# Patient Record
Sex: Female | Born: 1950 | Race: White | Hispanic: No | Marital: Married | Smoking: Former smoker
Health system: Southern US, Community
[De-identification: ages and names within clinical notes are randomized; demographics above are authoritative.]

## PROBLEM LIST (undated history)

## (undated) DIAGNOSIS — I700 Atherosclerosis of aorta: Secondary | ICD-10-CM

## (undated) DIAGNOSIS — B019 Varicella without complication: Secondary | ICD-10-CM

## (undated) DIAGNOSIS — R319 Hematuria, unspecified: Secondary | ICD-10-CM

## (undated) DIAGNOSIS — E559 Vitamin D deficiency, unspecified: Secondary | ICD-10-CM

## (undated) DIAGNOSIS — E785 Hyperlipidemia, unspecified: Secondary | ICD-10-CM

## (undated) DIAGNOSIS — E278 Other specified disorders of adrenal gland: Secondary | ICD-10-CM

## (undated) DIAGNOSIS — C9590 Leukemia, unspecified not having achieved remission: Secondary | ICD-10-CM

## (undated) DIAGNOSIS — M8580 Other specified disorders of bone density and structure, unspecified site: Secondary | ICD-10-CM

## (undated) HISTORY — DX: Other specified disorders of bone density and structure, unspecified site: M85.80

## (undated) HISTORY — DX: Hyperlipidemia, unspecified: E78.5

## (undated) HISTORY — DX: Leukemia, unspecified not having achieved remission: C95.90

## (undated) HISTORY — DX: Hematuria, unspecified: R31.9

## (undated) HISTORY — DX: Vitamin D deficiency, unspecified: E55.9

## (undated) HISTORY — DX: Atherosclerosis of aorta: I70.0

## (undated) HISTORY — DX: Varicella without complication: B01.9

## (undated) HISTORY — DX: Other specified disorders of adrenal gland: E27.8

---

## 1998-09-28 DIAGNOSIS — C9201 Acute myeloblastic leukemia, in remission: Secondary | ICD-10-CM

## 1998-09-28 HISTORY — DX: Acute myeloblastic leukemia, in remission: C92.01

## 2001-07-24 ENCOUNTER — Other Ambulatory Visit: Admission: RE | Admit: 2001-07-24 | Discharge: 2001-07-24 | Payer: Self-pay | Admitting: Obstetrics and Gynecology

## 2002-09-03 ENCOUNTER — Other Ambulatory Visit: Admission: RE | Admit: 2002-09-03 | Discharge: 2002-09-03 | Payer: Self-pay | Admitting: Obstetrics and Gynecology

## 2012-07-23 DIAGNOSIS — C9200 Acute myeloblastic leukemia, not having achieved remission: Secondary | ICD-10-CM | POA: Insufficient documentation

## 2014-09-20 ENCOUNTER — Encounter: Payer: Self-pay | Admitting: Adult Health

## 2014-09-20 ENCOUNTER — Ambulatory Visit (INDEPENDENT_AMBULATORY_CARE_PROVIDER_SITE_OTHER): Payer: BC Managed Care – PPO | Admitting: Adult Health

## 2014-09-20 VITALS — BP 112/76 | Ht 63.0 in | Wt 121.6 lb

## 2014-09-20 DIAGNOSIS — Z7189 Other specified counseling: Secondary | ICD-10-CM | POA: Diagnosis not present

## 2014-09-20 DIAGNOSIS — Z720 Tobacco use: Secondary | ICD-10-CM | POA: Diagnosis not present

## 2014-09-20 DIAGNOSIS — Z23 Encounter for immunization: Secondary | ICD-10-CM | POA: Diagnosis not present

## 2014-09-20 DIAGNOSIS — Z7689 Persons encountering health services in other specified circumstances: Secondary | ICD-10-CM

## 2014-09-20 DIAGNOSIS — F17200 Nicotine dependence, unspecified, uncomplicated: Secondary | ICD-10-CM

## 2014-09-20 NOTE — Patient Instructions (Addendum)
Please make an appointment for your complete physical exam at your convenience. The scheduling people will contact you to set up an appointment for a bone scan. Continue to work on quitting smoking and let me know if you want to try medications to help you quit. Let me know if you need anything. Smoking Cessation Quitting smoking is important to your health and has many advantages. However, it is not always easy to quit since nicotine is a very addictive drug. Oftentimes, people try 3 times or more before being able to quit. This document explains the best ways for you to prepare to quit smoking. Quitting takes hard work and a lot of effort, but you can do it. ADVANTAGES OF QUITTING SMOKING  You will live longer, feel better, and live better.  Your body will feel the impact of quitting smoking almost immediately.  Within 20 minutes, blood pressure decreases. Your pulse returns to its normal level.  After 8 hours, carbon monoxide levels in the blood return to normal. Your oxygen level increases.  After 24 hours, the chance of having a heart attack starts to decrease. Your breath, hair, and body stop smelling like smoke.  After 48 hours, damaged nerve endings begin to recover. Your sense of taste and smell improve.  After 72 hours, the body is virtually free of nicotine. Your bronchial tubes relax and breathing becomes easier.  After 2 to 12 weeks, lungs can hold more air. Exercise becomes easier and circulation improves.  The risk of having a heart attack, stroke, cancer, or lung disease is greatly reduced.  After 1 year, the risk of coronary heart disease is cut in half.  After 5 years, the risk of stroke falls to the same as a nonsmoker.  After 10 years, the risk of lung cancer is cut in half and the risk of other cancers decreases significantly.  After 15 years, the risk of coronary heart disease drops, usually to the level of a nonsmoker.  If you are pregnant, quitting smoking will  improve your chances of having a healthy baby.  The people you live with, especially any children, will be healthier.  You will have extra money to spend on things other than cigarettes. QUESTIONS TO THINK ABOUT BEFORE ATTEMPTING TO QUIT You may want to talk about your answers with your health care provider.  Why do you want to quit?  If you tried to quit in the past, what helped and what did not?  What will be the most difficult situations for you after you quit? How will you plan to handle them?  Who can help you through the tough times? Your family? Friends? A health care provider?  What pleasures do you get from smoking? What ways can you still get pleasure if you quit? Here are some questions to ask your health care provider:  How can you help me to be successful at quitting?  What medicine do you think would be best for me and how should I take it?  What should I do if I need more help?  What is smoking withdrawal like? How can I get information on withdrawal? GET READY  Set a quit date.  Change your environment by getting rid of all cigarettes, ashtrays, matches, and lighters in your home, car, or work. Do not let people smoke in your home.  Review your past attempts to quit. Think about what worked and what did not. GET SUPPORT AND ENCOURAGEMENT You have a better chance of being successful  if you have help. You can get support in many ways.  Tell your family, friends, and coworkers that you are going to quit and need their support. Ask them not to smoke around you.  Get individual, group, or telephone counseling and support. Programs are available at [employer] and health centers. Call your local health department for information about programs in your area.  Spiritual beliefs and practices may help some smokers quit.  Download a "quit meter" on your computer to keep track of quit statistics, such as how long you have gone without smoking, cigarettes not smoked,  and money saved.  Get a self-help book about quitting smoking and staying off tobacco. Hew[place] yourself from urges to smoke. Talk to someone, go for a walk, or occupy your time with a task.  Change your normal routine. Take a different route to work. Drink tea instead of coffee. Eat breakfast in a different place.  Reduce your stress. Take a hot bath, exercise, or read a book.  Plan something enjoyable to do every day. Reward yourself for not smoking.  Explore interactive web-based programs that specialize in helping you quit. GET MEDICINE AND USE IT CORRECTLY Medicines can help you stop smoking and decrease the urge to smoke. Combining medicine with the above behavioral methods and support can greatly increase your chances of successfully quitting smoking.  Nicotine replacement therapy helps deliver nicotine to your body without the negative effects and risks of smoking. Nicotine replacement therapy includes nicotine gum, lozenges, inhalers, nasal sprays, and skin patches. Some may be available over-the-counter and others require a prescription.  Antidepressant medicine helps people abstain from smoking, but how this works is unknown. This medicine is available by prescription.  Nicotinic receptor partial agonist medicine simulates the effect of nicotine in your brain. This medicine is available by prescription. Ask your health care provider for advice about which medicines to use and how to use them based on your health history. Your health care provider will tell you what side effects to look out for if you choose to be on a medicine or therapy. Carefully read the information on the package. Do not use any other product containing nicotine while using a nicotine replacement product.  RELAPSE OR DIFFICULT SITUATIONS Most relapses occur within the first 3 months after quitting. Do not be discouraged if you start smoking again. Remember, most people try  several times before finally quitting. You may have symptoms of withdrawal because your body is used to nicotine. You may crave cigarettes, be irritable, feel very hungry, cough often, get headaches, or have difficulty concentrating. The withdrawal symptoms are only temporary. They are strongest when you first quit, but they will go away within 10-14 days. To reduce the chances of relapse, try to:  Avoid drinking alcohol. Drinking lowers your chances of successfully quitting.  Reduce the amount of caffeine you consume. Once you quit smoking, the amount of caffeine in your body increases and can give you symptoms, such as a rapid heartbeat, sweating, and anxiety.  Avoid smokers because they can make you want to smoke.  Do not let weight gain distract you. Many smokers will gain weight when they quit, usually less than 10 pounds. Eat a healthy diet and stay active. You can always lose the weight gained after you quit.  Find ways to improve your mood other than smoking. FOR MORE INFORMATION  www.smokefree.gov  Document Released: 04/09/2001 Document Revised: 08/30/2013 Document Reviewed: 07/25/2011 [employer] Patient Information  2015 [employer]. This information is not intended to replace advice given to you by your health care provider. Make sure you discuss any questions you have with your health care provider. Health Maintenance Adopting a healthy lifestyle and getting preventive care can go a long way to promote health and wellness. Talk with your health care provider about what schedule of regular examinations is right for you. This is a good chance for you to check in with your provider about disease prevention and staying healthy. In between checkups, there are plenty of things you can do on your own. Experts have done a lot of research about which lifestyle changes and preventive measures are most likely to keep you healthy. Ask your health care provider for more information. WEIGHT AND  DIET  Eat a healthy diet  Be sure to include plenty of vegetables, fruits, low-fat dairy products, and lean protein.  Do not eat a lot of foods high in solid fats, added sugars, or salt.  Get regular exercise. This is one of the most important things you can do for your health.  Most adults should exercise for at least 150 minutes each week. The exercise should increase your heart rate and make you sweat (moderate-intensity exercise).  Most adults should also do strengthening exercises at least twice a week. This is in addition to the moderate-intensity exercise.  Maintain a healthy weight  Body mass index (BMI) is a measurement that can be used to identify possible weight problems. It estimates body fat based on height and weight. Your health care provider can help determine your BMI and help you achieve or maintain a healthy weight.  For females 44 years of age and older:   A BMI below 18.5 is considered underweight.  A BMI of 18.5 to 24.9 is normal.  A BMI of 25 to 29.9 is considered overweight.  A BMI of 30 and above is considered obese.  Watch levels of cholesterol and blood lipids  You should start having your blood tested for lipids and cholesterol at 65 years of age, then have this test every 5 years.  You may need to have your cholesterol levels checked more often if:  Your lipid or cholesterol levels are high.  You are older than 65 years of age.  You are at high risk for heart disease.  CANCER SCREENING   Lung Cancer  Lung cancer screening is recommended for adults 54-19 years old who are at high risk for lung cancer because of a history of smoking.  A yearly low-dose CT scan of the lungs is recommended for people who:  Currently smoke.  Have quit within the past 15 years.  Have at least a 30-pack-year history of smoking. A pack year is smoking an average of one pack of cigarettes a day for 1 year.  Yearly screening should continue until it has been  15 years since you quit.  Yearly screening should stop if you develop a health problem that would prevent you from having lung cancer treatment.  Breast Cancer  Practice breast self-awareness. This means understanding how your breasts normally appear and feel.  It also means doing regular breast self-exams. Let your health care provider know about any changes, no matter how small.  If you are in your 20s or 30s, you should have a clinical breast exam (CBE) by a health care provider every 1-3 years as part of a regular health exam.  If you are 33 or older, have a CBE every  year. Also consider having a breast X-ray (mammogram) every year.  If you have a family history of breast cancer, talk to your health care provider about genetic screening.  If you are at high risk for breast cancer, talk to your health care provider about having an MRI and a mammogram every year.  Breast cancer gene (BRCA) assessment is recommended for women who have family members with BRCA-related cancers. BRCA-related cancers include:  Breast.  Ovarian.  Tubal.  Peritoneal cancers.  Results of the assessment will determine the need for genetic counseling and BRCA1 and BRCA2 testing. Cervical Cancer Routine pelvic examinations to screen for cervical cancer are no longer recommended for nonpregnant women who are considered low risk for cancer of the pelvic organs (ovaries, uterus, and vagina) and who do not have symptoms. A pelvic examination may be necessary if you have symptoms including those associated with pelvic infections. Ask your health care provider if a screening pelvic exam is right for you.   The Pap test is the screening test for cervical cancer for women who are considered at risk.  If you had a hysterectomy for a problem that was not cancer or a condition that could lead to cancer, then you no longer need Pap tests.  If you are older than 65 years, and you have had normal Pap tests for the past  10 years, you no longer need to have Pap tests.  If you have had past treatment for cervical cancer or a condition that could lead to cancer, you need Pap tests and screening for cancer for at least 20 years after your treatment.  If you no longer get a Pap test, assess your risk factors if they change (such as having a new sexual partner). This can affect whether you should start being screened again.  Some women have medical problems that increase their chance of getting cervical cancer. If this is the case for you, your health care provider may recommend more frequent screening and Pap tests.  The human papillomavirus (HPV) test is another test that may be used for cervical cancer screening. The HPV test looks for the virus that can cause cell changes in the cervix. The cells collected during the Pap test can be tested for HPV.  The HPV test can be used to screen women 11 years of age and older. Getting tested for HPV can extend the interval between normal Pap tests from three to five years.  An HPV test also should be used to screen women of any age who have unclear Pap test results.  After 65 years of age, women should have HPV testing as often as Pap tests.  Colorectal Cancer  This type of cancer can be detected and often prevented.  Routine colorectal cancer screening usually begins at 65 years of age and continues through 65 years of age.  Your health care provider may recommend screening at an earlier age if you have risk factors for colon cancer.  Your health care provider may also recommend using home test kits to check for hidden blood in the stool.  A small camera at the end of a tube can be used to examine your colon directly (sigmoidoscopy or colonoscopy). This is done to check for the earliest forms of colorectal cancer.  Routine screening usually begins at age 23.  Direct examination of the colon should be repeated every 5-10 years through 65 years of age. However, you  may need to be screened more often if early  forms of precancerous polyps or small growths are found. Skin Cancer  Check your skin from head to toe regularly.  Tell your health care provider about any new moles or changes in moles, especially if there is a change in a mole's shape or color.  Also tell your health care provider if you have a mole that is larger than the size of a pencil eraser.  Always use sunscreen. Apply sunscreen liberally and repeatedly throughout the day.  Protect yourself by wearing long sleeves, pants, a wide-brimmed hat, and sunglasses whenever you are outside. HEART DISEASE, DIABETES, AND HIGH BLOOD PRESSURE   Have your blood pressure checked at least every 1-2 years. High blood pressure causes heart disease and increases the risk of stroke.  If you are between 59 years and 52 years old, ask your health care provider if you should take aspirin to prevent strokes.  Have regular diabetes screenings. This involves taking a blood sample to check your fasting blood sugar level.  If you are at a normal weight and have a low risk for diabetes, have this test once every three years after 65 years of age.  If you are overweight and have a high risk for diabetes, consider being tested at a younger age or more often. PREVENTING INFECTION  Hepatitis B  If you have a higher risk for hepatitis B, you should be screened for this virus. You are considered at high risk for hepatitis B if:  You were born in a country where hepatitis B is common. Ask your health care provider which countries are considered high risk.  Your parents were born in a high-risk country, and you have not been immunized against hepatitis B (hepatitis B vaccine).  You have HIV or AIDS.  You use needles to inject street drugs.  You live with someone who has hepatitis B.  You have had sex with someone who has hepatitis B.  You get hemodialysis treatment.  You take certain medicines for conditions,  including cancer, organ transplantation, and autoimmune conditions. Hepatitis C  Blood testing is recommended for:  Everyone born from [birth year] through 1966.  Anyone with known risk factors for hepatitis C. Sexually transmitted infections (STIs)  You should be screened for sexually transmitted infections (STIs) including gonorrhea and chlamydia if:  You are sexually active and are younger than 65 years of age.  You are older than 65 years of age and your health care provider tells you that you are at risk for this type of infection.  Your sexual activity has changed since you were last screened and you are at an increased risk for chlamydia or gonorrhea. Ask your health care provider if you are at risk.  If you do not have HIV, but are at risk, it may be recommended that you take a prescription medicine daily to prevent HIV infection. This is called pre-exposure prophylaxis (PrEP). You are considered at risk if:  You are sexually active and do not regularly use condoms or know the HIV status of your partner(s).  You take drugs by injection.  You are sexually active with a partner who has HIV. Talk with your health care provider about whether you are at high risk of being infected with HIV. If you choose to begin PrEP, you should first be tested for HIV. You should then be tested every 3 months for as long as you are taking PrEP.  PREGNANCY   If you are premenopausal and you may become pregnant, ask your health  care provider about preconception counseling.  If you may become pregnant, take 400 to 800 micrograms (mcg) of folic acid every day.  If you want to prevent pregnancy, talk to your health care provider about birth control (contraception). OSTEOPOROSIS AND MENOPAUSE   Osteoporosis is a disease in which the bones lose minerals and strength with aging. This can result in serious bone fractures. Your risk for osteoporosis can be identified using a bone density scan.  If you are 47  years of age or older, or if you are at risk for osteoporosis and fractures, ask your health care provider if you should be screened.  Ask your health care provider whether you should take a calcium or vitamin D supplement to lower your risk for osteoporosis.  Menopause may have certain physical symptoms and risks.  Hormone replacement therapy may reduce some of these symptoms and risks. Talk to your health care provider about whether hormone replacement therapy is right for you.  HOME CARE INSTRUCTIONS   Schedule regular health, dental, and eye exams.  Stay current with your immunizations.   Do not use any tobacco products including cigarettes, chewing tobacco, or electronic cigarettes.  If you are pregnant, do not drink alcohol.  If you are breastfeeding, limit how much and how often you drink alcohol.  Limit alcohol intake to no more than 1 drink per day for nonpregnant women. One drink equals 12 ounces of beer, 5 ounces of wine, or 1 ounces of hard liquor.  Do not use street drugs.  Do not share needles.  Ask your health care provider for help if you need support or information about quitting drugs.  Tell your health care provider if you often feel depressed.  Tell your health care provider if you have ever been abused or do not feel safe at home. Document Released: 10/29/2010 Document Revised: 08/30/2013 Document Reviewed: 03/17/2013 [facility] Patient Information 2015 [place], [place]. This information is not intended to replace advice given to you by your health care provider. Make sure you discuss any questions you have with your health care provider.

## 2014-09-20 NOTE — Addendum Note (Signed)
Addended by: Colleen Can on: 09/20/2014 03:20 PM   Modules accepted: Orders

## 2014-09-20 NOTE — Progress Notes (Signed)
HPI:  Emma Schneider is here to establish care.  Last PCP and physical:15 years ago. Follow up with oncology yearly.   Emma Schneider is a 64 y.o. 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 She has had no evidence of recurrence thus far.    She went to GYN in October for microhematuria during exam. Was sent to Urology in [place] who did a scope of urinary tract and couldnot find out reason for hematuria. She was referred to a Urologist at [facility] and has upcoming appointment.   Immunizations: Needs Shingles Diet: eats whatever she wants. Not a lot of processed or fast food. Eats fruits and vegetables.  Exercise: Walks, but would like to exercise more.  Colonoscopy: 2013 Dexa: Unsure.  Pap Smear: Yearly- normal.  Mammogram: Yearly - normal    Has the following chronic problems that require follow up and concerns today:  Hematuria - Is following with urology. Has no urinary complaints.   Smoking Cessation - She is cutting back on her smoking.   ROS negative for unless reported above: fevers, chills,feeling poorly, unintentional weight loss, hearing or vision loss, chest pain, palpitations, leg claudication, struggling to breath,Not feeling congested in the chest, no orthopenia, no cough,no wheezing, normal appetite, no soft tissue swelling, no hemoptysis, melena, hematochezia, , falls, loc, si, or thoughts of self harm.   Past Medical History  Diagnosis Date  . Leukemia   . Chicken pox   . Blood in urine     History reviewed. No pertinent past surgical history.  Family History  Problem Relation Age of Onset  . Congestive Heart Failure Mother   . Heart attack Father   . Heart disease Father     History   Social History  . Marital Status: Married    Spouse Name: N/A  . Number of Children: N/A  . Years of Education: N/A   Social History Main Topics  . Smoking status: Current Every Day Smoker -- 0.50  packs/day    Types: Cigarettes  . Smokeless tobacco: Not on file  . Alcohol Use: No  . Drug Use: No  . Sexual Activity: Not on file   Other Topics Concern  . 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    -Married for 30 years    - Has step children, all live in [place]   - No pets   - Plays games on the computer and enjoys yard work.         Current outpatient prescriptions:  .  calcium carbonate (OS-CAL) 600 MG TABS tablet, Take 1,200 mg by mouth daily., Disp: , Rfl:   EXAM:  Filed Vitals:   09/20/14 1355  BP: 112/76    Body mass index is 21.55 kg/(m^2).  GENERAL: vitals reviewed and listed above, alert, oriented, appears well hydrated and in no acute distress  HEENT: atraumatic, conjunttiva clear, no obvious abnormalities on inspection of external nose and ears. Small amount of Cerumen in bilateral ear canal.   NECK: Neck is soft and supple without masses, no adenopathy or thyromegaly, trachea midline, no JVD. Normal range of motion.   LUNGS: clear to auscultation bilaterally, no wheezes, rales or rhonchi, good air movement  CV: Regular rate and rhythm, normal S1/S2, no audible murmurs, gallops, or rubs. No carotid bruit and no peripheral edema.   MS: moves all extremities without noticeable abnormality. No edema noted  Abd: soft/nontender/nondistended/normal  bowel sounds   Skin: warm and dry, no rash   Extremities: No clubbing, cyanosis, or edema. Capillary refill is WNL. Pulses intact bilaterally in upper and lower extremities.   Neuro: CN II-XII intact, sensation and reflexes normal throughout, 5/5 muscle strength in bilateral upper and lower extremities. Normal finger to nose. Normal rapid alternating movements.  PSYCH: pleasant and cooperative, no obvious depression or anxiety  ASSESSMENT AND PLAN:  Discussed the following assessment and plan:  1. Encounter to establish care - Follow up with me for  CPE - Follow up  with me as needed  2. Tobacco use disorder - Counseling for <3 min on smoking cessation - She is going to try and quit on her own.  - DG Bone Density; Future  .  Encounter to establish care  Tobacco use disorder - Plan: DG Bone Density -We reviewed the PMH, PSH, FH, SH, Meds and Allergies. -We provided refills for any medications we will prescribe as needed. -We addressed current concerns per orders and patient instructions. -We have asked for records for pertinent exams, studies, vaccines and notes from previous providers. -We have advised patient to follow up per instructions below.   -Patient advised to return or notify a provider immediately if symptoms worsen or persist or new concerns arise.  Patient Instructions  Please make an appointment for your complete physical exam at your convenience. The scheduling people will contact you to set up an appointment for a bone scan. Continue to work on quitting smoking and let me know if you want to try medications to help you quit. Let me know if you need anything. Smoking Cessation Quitting smoking is important to your health and has many advantages. However, it is not always easy to quit since nicotine is a very addictive drug. Oftentimes, people try 3 times or more before being able to quit. This document explains the best ways for you to prepare to quit smoking. Quitting takes hard work and a lot of effort, but you can do it. ADVANTAGES OF QUITTING SMOKING  You will live longer, feel better, and live better.  Your body will feel the impact of quitting smoking almost immediately.  Within 20 minutes, blood pressure decreases. Your pulse returns to its normal level.  After 8 hours, carbon monoxide levels in the blood return to normal. Your oxygen level increases.  After 24 hours, the chance of having a heart attack starts to decrease. Your breath, hair, and body stop smelling like smoke.  After 48 hours, damaged nerve endings begin  to recover. Your sense of taste and smell improve.  After 72 hours, the body is virtually free of nicotine. Your bronchial tubes relax and breathing becomes easier.  After 2 to 12 weeks, lungs can hold more air. Exercise becomes easier and circulation improves.  The risk of having a heart attack, stroke, cancer, or lung disease is greatly reduced.  After 1 year, the risk of coronary heart disease is cut in half.  After 5 years, the risk of stroke falls to the same as a nonsmoker.  After 10 years, the risk of lung cancer is cut in half and the risk of other cancers decreases significantly.  After 15 years, the risk of coronary heart disease drops, usually to the level of a nonsmoker.  If you are pregnant, quitting smoking will improve your chances of having a healthy baby.  The people you live with, especially any children, will be healthier.  You will have extra money to  spend on things other than cigarettes. QUESTIONS TO THINK ABOUT BEFORE ATTEMPTING TO QUIT You may want to talk about your answers with your health care provider.  Why do you want to quit?  If you tried to quit in the past, what helped and what did not?  What will be the most difficult situations for you after you quit? How will you plan to handle them?  Who can help you through the tough times? Your family? Friends? A health care provider?  What pleasures do you get from smoking? What ways can you still get pleasure if you quit? Here are some questions to ask your health care provider:  How can you help me to be successful at quitting?  What medicine do you think would be best for me and how should I take it?  What should I do if I need more help?  What is smoking withdrawal like? How can I get information on withdrawal? GET READY  Set a quit date.  Change your environment by getting rid of all cigarettes, ashtrays, matches, and lighters in your home, car, or work. Do not let people smoke in your  home.  Review your past attempts to quit. Think about what worked and what did not. GET SUPPORT AND ENCOURAGEMENT You have a better chance of being successful if you have help. You can get support in many ways.  Tell your family, friends, and coworkers that you are going to quit and need their support. Ask them not to smoke around you.  Get individual, group, or telephone counseling and support. Programs are available at [employer] and health centers. Call your local health department for information about programs in your area.  Spiritual beliefs and practices may help some smokers quit.  Download a "quit meter" on your computer to keep track of quit statistics, such as how long you have gone without smoking, cigarettes not smoked, and money saved.  Get a self-help book about quitting smoking and staying off tobacco. [place] yourself from urges to smoke. Talk to someone, go for a walk, or occupy your time with a task.  Change your normal routine. Take a different route to work. Drink tea instead of coffee. Eat breakfast in a different place.  Reduce your stress. Take a hot bath, exercise, or read a book.  Plan something enjoyable to do every day. Reward yourself for not smoking.  Explore interactive web-based programs that specialize in helping you quit. GET MEDICINE AND USE IT CORRECTLY Medicines can help you stop smoking and decrease the urge to smoke. Combining medicine with the above behavioral methods and support can greatly increase your chances of successfully quitting smoking.  Nicotine replacement therapy helps deliver nicotine to your body without the negative effects and risks of smoking. Nicotine replacement therapy includes nicotine gum, lozenges, inhalers, nasal sprays, and skin patches. Some may be available over-the-counter and others require a prescription.  Antidepressant medicine helps people abstain from smoking, but how this  works is unknown. This medicine is available by prescription.  Nicotinic receptor partial agonist medicine simulates the effect of nicotine in your brain. This medicine is available by prescription. Ask your health care provider for advice about which medicines to use and how to use them based on your health history. Your health care provider will tell you what side effects to look out for if you choose to be on a medicine or therapy. Carefully read the information on the package. Do  not use any other product containing nicotine while using a nicotine replacement product.  RELAPSE OR DIFFICULT SITUATIONS Most relapses occur within the first 3 months after quitting. Do not be discouraged if you start smoking again. Remember, most people try several times before finally quitting. You may have symptoms of withdrawal because your body is used to nicotine. You may crave cigarettes, be irritable, feel very hungry, cough often, get headaches, or have difficulty concentrating. The withdrawal symptoms are only temporary. They are strongest when you first quit, but they will go away within 10-14 days. To reduce the chances of relapse, try to:  Avoid drinking alcohol. Drinking lowers your chances of successfully quitting.  Reduce the amount of caffeine you consume. Once you quit smoking, the amount of caffeine in your body increases and can give you symptoms, such as a rapid heartbeat, sweating, and anxiety.  Avoid smokers because they can make you want to smoke.  Do not let weight gain distract you. Many smokers will gain weight when they quit, usually less than 10 pounds. Eat a healthy diet and stay active. You can always lose the weight gained after you quit.  Find ways to improve your mood other than smoking. FOR MORE INFORMATION  www.smokefree.gov  Document Released: 04/09/2001 Document Revised: 08/30/2013 Document Reviewed: 07/25/2011 [facility] Patient Information 2015 [place], [place]. This information  is not intended to replace advice given to you by your health care provider. Make sure you discuss any questions you have with your health care provider. Health Maintenance Adopting a healthy lifestyle and getting preventive care can go a long way to promote health and wellness. Talk with your health care provider about what schedule of regular examinations is right for you. This is a good chance for you to check in with your provider about disease prevention and staying healthy. In between checkups, there are plenty of things you can do on your own. Experts have done a lot of research about which lifestyle changes and preventive measures are most likely to keep you healthy. Ask your health care provider for more information. WEIGHT AND DIET  Eat a healthy diet  Be sure to include plenty of vegetables, fruits, low-fat dairy products, and lean protein.  Do not eat a lot of foods high in solid fats, added sugars, or salt.  Get regular exercise. This is one of the most important things you can do for your health.  Most adults should exercise for at least 150 minutes each week. The exercise should increase your heart rate and make you sweat (moderate-intensity exercise).  Most adults should also do strengthening exercises at least twice a week. This is in addition to the moderate-intensity exercise.  Maintain a healthy weight  Body mass index (BMI) is a measurement that can be used to identify possible weight problems. It estimates body fat based on height and weight. Your health care provider can help determine your BMI and help you achieve or maintain a healthy weight.  For females 34 years of age and older:   A BMI below 18.5 is considered underweight.  A BMI of 18.5 to 24.9 is normal.  A BMI of 25 to 29.9 is considered overweight.  A BMI of 30 and above is considered obese.  Watch levels of cholesterol and blood lipids  You should start having your blood tested for lipids and  cholesterol at 65 years of age, then have this test every 5 years.  You may need to have your cholesterol levels checked more  often if:  Your lipid or cholesterol levels are high.  You are older than 65 years of age.  You are at high risk for heart disease.  CANCER SCREENING   Lung Cancer  Lung cancer screening is recommended for adults 5-73 years old who are at high risk for lung cancer because of a history of smoking.  A yearly low-dose CT scan of the lungs is recommended for people who:  Currently smoke.  Have quit within the past 15 years.  Have at least a 30-pack-year history of smoking. A pack year is smoking an average of one pack of cigarettes a day for 1 year.  Yearly screening should continue until it has been 15 years since you quit.  Yearly screening should stop if you develop a health problem that would prevent you from having lung cancer treatment.  Breast Cancer  Practice breast self-awareness. This means understanding how your breasts normally appear and feel.  It also means doing regular breast self-exams. Let your health care provider know about any changes, no matter how small.  If you are in your 20s or 30s, you should have a clinical breast exam (CBE) by a health care provider every 1-3 years as part of a regular health exam.  If you are 79 or older, have a CBE every year. Also consider having a breast X-ray (mammogram) every year.  If you have a family history of breast cancer, talk to your health care provider about genetic screening.  If you are at high risk for breast cancer, talk to your health care provider about having an MRI and a mammogram every year.  Breast cancer gene (BRCA) assessment is recommended for women who have family members with BRCA-related cancers. BRCA-related cancers include:  Breast.  Ovarian.  Tubal.  Peritoneal cancers.  Results of the assessment will determine the need for genetic counseling and BRCA1 and BRCA2  testing. Cervical Cancer Routine pelvic examinations to screen for cervical cancer are no longer recommended for nonpregnant women who are considered low risk for cancer of the pelvic organs (ovaries, uterus, and vagina) and who do not have symptoms. A pelvic examination may be necessary if you have symptoms including those associated with pelvic infections. Ask your health care provider if a screening pelvic exam is right for you.   The Pap test is the screening test for cervical cancer for women who are considered at risk.  If you had a hysterectomy for a problem that was not cancer or a condition that could lead to cancer, then you no longer need Pap tests.  If you are older than 65 years, and you have had normal Pap tests for the past 10 years, you no longer need to have Pap tests.  If you have had past treatment for cervical cancer or a condition that could lead to cancer, you need Pap tests and screening for cancer for at least 20 years after your treatment.  If you no longer get a Pap test, assess your risk factors if they change (such as having a new sexual partner). This can affect whether you should start being screened again.  Some women have medical problems that increase their chance of getting cervical cancer. If this is the case for you, your health care provider may recommend more frequent screening and Pap tests.  The human papillomavirus (HPV) test is another test that may be used for cervical cancer screening. The HPV test looks for the virus that can cause cell changes in  the cervix. The cells collected during the Pap test can be tested for HPV.  The HPV test can be used to screen women 75 years of age and older. Getting tested for HPV can extend the interval between normal Pap tests from three to five years.  An HPV test also should be used to screen women of any age who have unclear Pap test results.  After 65 years of age, women should have HPV testing as often as Pap  tests.  Colorectal Cancer  This type of cancer can be detected and often prevented.  Routine colorectal cancer screening usually begins at 65 years of age and continues through 65 years of age.  Your health care provider may recommend screening at an earlier age if you have risk factors for colon cancer.  Your health care provider may also recommend using home test kits to check for hidden blood in the stool.  A small camera at the end of a tube can be used to examine your colon directly (sigmoidoscopy or colonoscopy). This is done to check for the earliest forms of colorectal cancer.  Routine screening usually begins at age 35.  Direct examination of the colon should be repeated every 5-10 years through 65 years of age. However, you may need to be screened more often if early forms of precancerous polyps or small growths are found. Skin Cancer  Check your skin from head to toe regularly.  Tell your health care provider about any new moles or changes in moles, especially if there is a change in a mole's shape or color.  Also tell your health care provider if you have a mole that is larger than the size of a pencil eraser.  Always use sunscreen. Apply sunscreen liberally and repeatedly throughout the day.  Protect yourself by wearing long sleeves, pants, a wide-brimmed hat, and sunglasses whenever you are outside. HEART DISEASE, DIABETES, AND HIGH BLOOD PRESSURE   Have your blood pressure checked at least every 1-2 years. High blood pressure causes heart disease and increases the risk of stroke.  If you are between 20 years and 9 years old, ask your health care provider if you should take aspirin to prevent strokes.  Have regular diabetes screenings. This involves taking a blood sample to check your fasting blood sugar level.  If you are at a normal weight and have a low risk for diabetes, have this test once every three years after 65 years of age.  If you are overweight and  have a high risk for diabetes, consider being tested at a younger age or more often. PREVENTING INFECTION  Hepatitis B  If you have a higher risk for hepatitis B, you should be screened for this virus. You are considered at high risk for hepatitis B if:  You were born in a country where hepatitis B is common. Ask your health care provider which countries are considered high risk.  Your parents were born in a high-risk country, and you have not been immunized against hepatitis B (hepatitis B vaccine).  You have HIV or AIDS.  You use needles to inject street drugs.  You live with someone who has hepatitis B.  You have had sex with someone who has hepatitis B.  You get hemodialysis treatment.  You take certain medicines for conditions, including cancer, organ transplantation, and autoimmune conditions. Hepatitis C  Blood testing is recommended for:  Everyone born from [birth year] through 1966.  Anyone with known risk factors for hepatitis C. Sexually  transmitted infections (STIs)  You should be screened for sexually transmitted infections (STIs) including gonorrhea and chlamydia if:  You are sexually active and are younger than 65 years of age.  You are older than 65 years of age and your health care provider tells you that you are at risk for this type of infection.  Your sexual activity has changed since you were last screened and you are at an increased risk for chlamydia or gonorrhea. Ask your health care provider if you are at risk.  If you do not have HIV, but are at risk, it may be recommended that you take a prescription medicine daily to prevent HIV infection. This is called pre-exposure prophylaxis (PrEP). You are considered at risk if:  You are sexually active and do not regularly use condoms or know the HIV status of your partner(s).  You take drugs by injection.  You are sexually active with a partner who has HIV. Talk with your health care provider about whether you  are at high risk of being infected with HIV. If you choose to begin PrEP, you should first be tested for HIV. You should then be tested every 3 months for as long as you are taking PrEP.  PREGNANCY   If you are premenopausal and you may become pregnant, ask your health care provider about preconception counseling.  If you may become pregnant, take 400 to 800 micrograms (mcg) of folic acid every day.  If you want to prevent pregnancy, talk to your health care provider about birth control (contraception). OSTEOPOROSIS AND MENOPAUSE   Osteoporosis is a disease in which the bones lose minerals and strength with aging. This can result in serious bone fractures. Your risk for osteoporosis can be identified using a bone density scan.  If you are 61 years of age or older, or if you are at risk for osteoporosis and fractures, ask your health care provider if you should be screened.  Ask your health care provider whether you should take a calcium or vitamin D supplement to lower your risk for osteoporosis.  Menopause may have certain physical symptoms and risks.  Hormone replacement therapy may reduce some of these symptoms and risks. Talk to your health care provider about whether hormone replacement therapy is right for you.  HOME CARE INSTRUCTIONS   Schedule regular health, dental, and eye exams.  Stay current with your immunizations.   Do not use any tobacco products including cigarettes, chewing tobacco, or electronic cigarettes.  If you are pregnant, do not drink alcohol.  If you are breastfeeding, limit how much and how often you drink alcohol.  Limit alcohol intake to no more than 1 drink per day for nonpregnant women. One drink equals 12 ounces of beer, 5 ounces of wine, or 1 ounces of hard liquor.  Do not use street drugs.  Do not share needles.  Ask your health care provider for help if you need support or information about quitting drugs.  Tell your health care provider if  you often feel depressed.  Tell your health care provider if you have ever been abused or do not feel safe at home. Document Released: 10/29/2010 Document Revised: 08/30/2013 Document Reviewed: 03/17/2013 [facility] Patient Information 2015 [place], [place]. This information is not intended to replace advice given to you by your health care provider. Make sure you discuss any questions you have with your health care provider.      [employer]

## 2014-10-17 DIAGNOSIS — R3129 Other microscopic hematuria: Secondary | ICD-10-CM

## 2014-10-17 HISTORY — DX: Other microscopic hematuria: R31.29

## 2014-12-16 DIAGNOSIS — D3501 Benign neoplasm of right adrenal gland: Secondary | ICD-10-CM

## 2014-12-16 HISTORY — DX: Benign neoplasm of right adrenal gland: D35.01

## 2015-01-06 ENCOUNTER — Ambulatory Visit (INDEPENDENT_AMBULATORY_CARE_PROVIDER_SITE_OTHER): Payer: Medicare PPO | Admitting: Adult Health

## 2015-01-06 ENCOUNTER — Encounter: Payer: Self-pay | Admitting: Adult Health

## 2015-01-06 ENCOUNTER — Other Ambulatory Visit: Payer: Self-pay | Admitting: Adult Health

## 2015-01-06 VITALS — BP 100/76 | Temp 98.5°F | Ht 63.0 in | Wt 119.9 lb

## 2015-01-06 DIAGNOSIS — Z780 Asymptomatic menopausal state: Secondary | ICD-10-CM | POA: Diagnosis not present

## 2015-01-06 DIAGNOSIS — Z720 Tobacco use: Secondary | ICD-10-CM | POA: Diagnosis not present

## 2015-01-06 DIAGNOSIS — F17200 Nicotine dependence, unspecified, uncomplicated: Secondary | ICD-10-CM

## 2015-01-06 DIAGNOSIS — Z0000 Encounter for general adult medical examination without abnormal findings: Secondary | ICD-10-CM | POA: Diagnosis not present

## 2015-01-06 DIAGNOSIS — H9193 Unspecified hearing loss, bilateral: Secondary | ICD-10-CM

## 2015-01-06 DIAGNOSIS — E278 Other specified disorders of adrenal gland: Secondary | ICD-10-CM | POA: Insufficient documentation

## 2015-01-06 DIAGNOSIS — R319 Hematuria, unspecified: Secondary | ICD-10-CM | POA: Insufficient documentation

## 2015-01-06 DIAGNOSIS — R8290 Unspecified abnormal findings in urine: Secondary | ICD-10-CM

## 2015-01-06 HISTORY — DX: Nicotine dependence, unspecified, uncomplicated: F17.200

## 2015-01-06 LAB — LIPID PANEL
CHOLESTEROL: 218 mg/dL — AB (ref 0–200)
HDL: 48.8 mg/dL (ref >39.00)
LDL Cholesterol: 145 mg/dL — ABNORMAL HIGH (ref 0–99)
NonHDL: 169.58
TRIGLYCERIDES: 123 mg/dL (ref 0.0–149.0)
Total CHOL/HDL Ratio: 4
VLDL: 24.6 mg/dL (ref 0.0–40.0)

## 2015-01-06 LAB — POCT URINALYSIS DIPSTICK
Bilirubin, UA: NEGATIVE
Glucose, UA: NEGATIVE
Ketones, UA: NEGATIVE
Nitrite, UA: NEGATIVE
PROTEIN UA: NEGATIVE
Spec Grav, UA: 1.02
UROBILINOGEN UA: 0.2
pH, UA: 6.5

## 2015-01-06 LAB — CBC WITH DIFFERENTIAL/PLATELET
Basophils Absolute: 0 10*3/uL (ref 0.0–0.1)
Basophils Relative: 0.8 % (ref 0.0–3.0)
EOS ABS: 0.1 10*3/uL (ref 0.0–0.7)
Eosinophils Relative: 1.2 % (ref 0.0–5.0)
HCT: 42.5 % (ref 36.0–46.0)
HEMOGLOBIN: 14.3 g/dL (ref 12.0–15.0)
Lymphocytes Relative: 30 % (ref 12.0–46.0)
Lymphs Abs: 1.7 10*3/uL (ref 0.7–4.0)
MCHC: 33.6 g/dL (ref 30.0–36.0)
MCV: 88.3 fl (ref 78.0–100.0)
MONO ABS: 0.5 10*3/uL (ref 0.1–1.0)
Monocytes Relative: 9.3 % (ref 3.0–12.0)
NEUTROS ABS: 3.3 10*3/uL (ref 1.4–7.7)
Neutrophils Relative %: 58.7 % (ref 43.0–77.0)
PLATELETS: 295 10*3/uL (ref 150.0–400.0)
RBC: 4.81 Mil/uL (ref 3.87–5.11)
RDW: 13.6 % (ref 11.5–15.5)
WBC: 5.6 10*3/uL (ref 4.0–10.5)

## 2015-01-06 LAB — BASIC METABOLIC PANEL
BUN: 15 mg/dL (ref 6–23)
CO2: 28 meq/L (ref 19–32)
CREATININE: 0.81 mg/dL (ref 0.40–1.20)
Calcium: 9.7 mg/dL (ref 8.4–10.5)
Chloride: 106 mEq/L (ref 96–112)
GFR: 75.42 mL/min (ref >60.00)
GLUCOSE: 96 mg/dL (ref 70–99)
POTASSIUM: 4.7 meq/L (ref 3.5–5.1)
Sodium: 142 mEq/L (ref 135–145)

## 2015-01-06 LAB — MAGNESIUM: Magnesium: 2.2 mg/dL (ref 1.5–2.5)

## 2015-01-06 LAB — HEPATIC FUNCTION PANEL
ALT: 11 U/L (ref 0–35)
AST: 15 U/L (ref 0–37)
Albumin: 4.2 g/dL (ref 3.5–5.2)
Alkaline Phosphatase: 59 U/L (ref 39–117)
BILIRUBIN TOTAL: 0.5 mg/dL (ref 0.2–1.2)
Bilirubin, Direct: 0.1 mg/dL (ref 0.0–0.3)
Total Protein: 7.3 g/dL (ref 6.0–8.3)

## 2015-01-06 LAB — HEMOGLOBIN A1C: HEMOGLOBIN A1C: 5.9 % (ref 4.6–6.5)

## 2015-01-06 LAB — VITAMIN D 25 HYDROXY (VIT D DEFICIENCY, FRACTURES): VITD: 10.17 ng/mL — ABNORMAL LOW (ref 30.00–100.00)

## 2015-01-06 LAB — TSH: TSH: 2.61 u[IU]/mL (ref 0.35–4.50)

## 2015-01-06 NOTE — Patient Instructions (Signed)
  Emma Schneider , Thank you for taking time to come for your Medicare Wellness Visit. I appreciate your ongoing commitment to your health goals. Please review the following plan we discussed and let me know if I can assist you in the future.   These are the goals we discussed: Goals    . Increase physical activity    . Quit smoking / using tobacco       This is a list of the screening recommended for you and due dates:  Health Maintenance  Topic Date Due  .  Hepatitis C: One time screening is recommended by Center for Disease Control  (CDC) for  adults born from [birth year] through 1966.   Jan 21, 1951  . HIV Screening  12/28/1964  . Pap Smear  12/29/1970  . Flu Shot  11/28/2014  . DEXA scan (bone density measurement)  12/29/2014  . Pneumonia vaccines (1 of 2 - PCV13) 12/29/2014  . Mammogram  01/28/2016  . Colon Cancer Screening  04/29/2021  . Tetanus Vaccine  09/19/2024  . Shingles Vaccine  Completed

## 2015-01-06 NOTE — Progress Notes (Signed)
Subjective:  Patient presents today for their annual wellness visit.  She is a very pleasant caucasian female who  has a past medical history of Leukemia; Chicken pox; and Blood in urine.   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   Medicare questionnaire was completed  All immunizations and health maintenance protocols were reviewed with the patient and needed orders were placed.  Appropriate screening laboratory values were ordered for the patient including screening of hyperlipidemia, renal function and hepatic function.  Medication reconciliation,  past medical history, social history, problem list and allergies were reviewed in detail with the patient  Goals were established with regard to weight loss, exercise, and  diet in compliance with medications  End of life planning was discussed- she has a living will and advanced directives in place  She has her eye exam and mammogram scheduled.    Preventive Screening-Counseling & Management  Smoking Status: Current smoker. She is working on quitting,smoking less than 10 cigarettes a day. Was smoking 1.5-2 packs at the most.   Second Hand Smoking status: Husband smokes at home.   Risk Factors Regular exercise: Walking multiple times per week, at least two times a week Diet: Heart healthy diet Fall Risk:None  Cardiac risk factors:  advanced age (older than 54 for men, 29 for women)  No Hyperlipidemia  No diabetes. Family History: Father with MI and Heart disease. Mother with CHF  Depression Screen None. PHQ2 0   Activities of Daily Living  Independent ADLs and IADLs   Hearing Difficulties: Patient endorses hearing difficulties, understanding conversation in noisy room and soft voices  Cognitive Testing No reported trouble.   Normal 3 word recall  List the Names of Other Physician/Practitioners you currently use: 1.  Dr. Sabas Sous - Hematology 2. [facility]  3. [facility] - endocrinology 4. Dr. Kandee Keen - Urology   Immunization History  Administered Date(s) Administered  . Tdap 09/20/2014  . Zoster 01/03/2015   Required Immunizations needed today: Needs flu, just had shingles vaccination. Needs to wait 4 weeks.   Screening tests- up to date Health Maintenance Due  Topic Date Due  . Hepatitis C Screening  April 23, 1951  . HIV Screening  12/28/1964  . PAP SMEAR  12/29/1970  . INFLUENZA VACCINE  11/28/2014  . DEXA SCAN  12/29/2014  . PNA vac Low Risk Adult (1 of 2 - PCV13) 12/29/2014    ROS- No pertinent positives discovered in course of AWV  The following were reviewed and entered/updated in epic: Past Medical History  Diagnosis Date  . Leukemia   . Chicken pox   . Blood in urine    Patient Active Problem List   Diagnosis Date Noted  . 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 No pertinent past surgical history.  Family History  Problem Relation Age of Onset  . Congestive Heart Failure Mother   . Heart attack Father   . Heart disease Father     Medications- reviewed and updated Current Outpatient Prescriptions  Medication Sig Dispense Refill  . calcium carbonate (OS-CAL) 600 MG TABS tablet Take 1,200 mg by mouth daily.     No current facility-administered medications for this visit.    Allergies-reviewed and updated No Known Allergies  Social History   Social History  . Marital Status: Married    Spouse Name: N/A  . Number of Children: N/A  . Years of Education: N/A  Social History Main Topics  . Smoking status: Current Every Day Smoker -- 0.50 packs/day    Types: Cigarettes  . Smokeless tobacco: None  . Alcohol Use: No  . Drug Use: No  . Sexual Activity: Not Asked   Other Topics Concern  . 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    -Married for 30 years    - Has step children, all  live in [place]   - No pets   - Plays games on the computer and enjoys yard work.        Objective: BP 100/76 mmHg  Temp(Src) 98.5 F (36.9 C) (Oral)  Ht 5\' 3"  (1.6 m)  Wt 119 lb 14.4 oz (54.386 kg)  BMI 21.24 kg/m2 GENERAL: vitals reviewed and listed above, alert, oriented, appears well hydrated and in no acute distress  HEENT: atraumatic, conjunttiva clear, no obvious abnormalities on inspection of external nose and ears. Small amount of Cerumen in bilateral ear canal.   NECK: Neck is soft and supple without masses, no adenopathy or thyromegaly, trachea midline, no JVD. Normal range of motion.   LUNGS: clear to auscultation bilaterally, no wheezes, rales or rhonchi, good air movement  CV: Regular rate and rhythm, normal S1/S2, no audible murmurs, gallops, or rubs. No carotid bruit and no peripheral edema.   MS: moves all extremities without noticeable abnormality. No edema noted  Abd: soft/nontender/nondistended/normal bowel sounds   Breast: Exam WNL. No masses, lumps, dimpling or discharge.   Skin: warm and dry, no rash   Extremities: No clubbing, cyanosis, or edema. Capillary refill is WNL. Pulses intact bilaterally in upper and lower extremities.   Neuro: CN II-XII intact, sensation and reflexes normal throughout, 5/5 muscle strength in bilateral upper and lower extremities. Normal finger to nose. Normal rapid alternating movements.  PSYCH: pleasant and cooperative, no obvious depression or anxiety  Assessment/Plan: 1. Initial Medicare annual wellness visit - Basic metabolic panel - CBC with Differential/Platelet - Hemoglobin A1c - Hepatic function panel - Lipid panel - POCT urinalysis dipstick - TSH - DG Bone Density; Future - EKG 12-Lead - Magnesium - Vitamin D, 25-hydroxy - Hep C Antibody - Follow up in one year for MWE - Follow up sooner if needed  2. Postmenopausal - DG Bone Density; Future - Magnesium - Vitamin D, 25-hydroxy  3. Tobacco use  disorder - Needs to quit smoking. Does not want to use any other aids besides nicotine gum at this time.  - Ambulatory Referral for Lung Cancer Scre  4. Hearing impaired, bilateral - Ambulatory referral to Audiology    Return precautions advised.   Emma Schneider, AGNP   These are the goals we discussed: Goals    . Increase physical activity    . Quit smoking / using tobacco       This is a list of the screening recommended for you and due dates:  Health Maintenance  Topic Date Due  .  Hepatitis C: One time screening is recommended by Center for Disease Control  (CDC) for  adults born from [birth year] through 1966.   26-May-1950  . HIV Screening  12/28/1964  . Pap Smear  12/29/1970  . Flu Shot  11/28/2014  . DEXA scan (bone density measurement)  12/29/2014  . Pneumonia vaccines (1 of 2 - PCV13) 12/29/2014  . Mammogram  01/28/2016  . Colon Cancer Screening  04/29/2021  . Tetanus Vaccine  09/19/2024  . Shingles Vaccine  Completed

## 2015-01-06 NOTE — Addendum Note (Signed)
Addended by: Elmer Picker on: 01/06/2015 05:11 PM   Modules accepted: Orders

## 2015-01-07 LAB — HEPATITIS C ANTIBODY: HCV Ab: REACTIVE — AB

## 2015-01-09 ENCOUNTER — Other Ambulatory Visit: Payer: Self-pay | Admitting: Adult Health

## 2015-01-09 LAB — URINE CULTURE

## 2015-01-09 LAB — HEPATITIS C RNA QUANTITATIVE: HCV Quantitative: NOT DETECTED IU/mL (ref <15)

## 2015-01-09 MED ORDER — CIPROFLOXACIN HCL 500 MG PO TABS: 500.0000 mg | ORAL_TABLET | Freq: Two times a day (BID) | ORAL | Status: DC

## 2015-02-10 ENCOUNTER — Ambulatory Visit (INDEPENDENT_AMBULATORY_CARE_PROVIDER_SITE_OTHER): Payer: Medicare PPO | Admitting: Adult Health

## 2015-02-10 DIAGNOSIS — Z23 Encounter for immunization: Secondary | ICD-10-CM | POA: Diagnosis not present

## 2015-02-13 ENCOUNTER — Other Ambulatory Visit: Payer: Self-pay | Admitting: Acute Care

## 2015-02-13 ENCOUNTER — Ambulatory Visit (INDEPENDENT_AMBULATORY_CARE_PROVIDER_SITE_OTHER): Payer: Medicare PPO | Admitting: Acute Care

## 2015-02-13 ENCOUNTER — Encounter: Payer: Self-pay | Admitting: Acute Care

## 2015-02-13 DIAGNOSIS — F17210 Nicotine dependence, cigarettes, uncomplicated: Secondary | ICD-10-CM

## 2015-02-13 NOTE — Progress Notes (Signed)
Shared Decision Making Visit Lung Cancer Screening Program [phone])   Eligibility:  Age 64 y.o.  Pack Years Smoking History Calculation 42 pack years (# packs/per year x # years smoked)  Recent History of coughing up blood  no  Unexplained weight loss? no ( >Than 15 pounds within the last 6 months )  Prior History Lung / other cancer :No ( Had leukemia in 2000/ no surveillance CT's) (Diagnosis within the last 5 years already requiring surveillance chest CT Scans).  Smoking Status Current Smoker  Former Smokers: Years since quit: NA  Quit Date: NA  Visit Components:  Discussion included one or more decision making aids. yes  Discussion included risk/benefits of screening. yes  Discussion included potential follow up diagnostic testing for abnormal scans. yes  Discussion included meaning and risk of over diagnosis. yes  Discussion included meaning and risk of False Positives. yes  Discussion included meaning of total radiation exposure. yes  Counseling Included:  Importance of adherence to annual lung cancer LDCT screening. yes  Impact of comorbidities on ability to participate in the program. yes  Ability and willingness to under diagnostic treatment. yes  Smoking Cessation Counseling:  Current Smokers:   Discussed importance of smoking cessation. yes  Information about tobacco cessation classes and interventions provided to patient. yes  Patient provided with "ticket" for LDCT Scan. yes  Symptomatic Patient. no  Counseling:NA  Diagnosis Code: Tobacco Use Z72.0  Asymptomatic Patient yes  Counseling (Intermediate counseling: > three minutes counseling) [id number]  Former Smokers:   Discussed the importance of maintaining cigarette abstinence. NA; Current smoker  Diagnosis Code: Personal History of Nicotine Dependence. K99.833  Information about tobacco cessation classes and interventions provided to patient. See above  Patient provided with "ticket" for  LDCT Scan. yes  Written Order for Lung Cancer Screening with LDCT placed in Epic. Yes (CT Chest Lung Cancer Screening Low Dose W/O CM) [id number] Z12.2-Screening of respiratory organs Z87.891-Personal history of nicotine dependence  I spent 15 minutes of face to face time with Emma Schneider  explaining the risks and benefits of lung cancer screening. We viewed a power point together that addressed the above noted topics, stopping at intervals to allow for questions to be asked and answered to ensure understanding. We discussed that the single most powerful action that she can take to decrease her risk of lung cancer is to quit smoking. She said she is working on this but is not ready to set a quit date. We discussed smoking cessation classes, nicotine replacement therapy, and medications to assist in the goal of becoming smoke free.We also discussed setting small goals, such as eliminating a cigarette a day for 2 weeks, and then another cigarette a day, and so on. We discussed that sometimes setting small goals makes a big project seem more attainable. She agreed, and thought she may use this strategy. I gave her the " Be stronger than your excuses " card with community resources and the [facility] number to call for free nicotine replacement therapy. Additionally I gave her my card and contact information and I told her to call me if there is anything I can do to help her meet this goal.I also gave her a copy of the power point we viewed together to refer to in the event she had any further questions,. I told her I would call her within 48 hours of the scan being completed with the results. I walked her to Emma Schneider, who will  to schedule her LDCT  scan. Ms. Orlov verbalized understanding of all of the above and  had no further questions upon leaving the office. She has my contact information in the event she has any additional questions or concerns.   Emma Spatz, NP

## 2015-02-20 ENCOUNTER — Inpatient Hospital Stay: Admission: RE | Admit: 2015-02-20 | Payer: Medicare PPO | Source: Ambulatory Visit

## 2015-02-20 ENCOUNTER — Ambulatory Visit (INDEPENDENT_AMBULATORY_CARE_PROVIDER_SITE_OTHER)
Admission: RE | Admit: 2015-02-20 | Discharge: 2015-02-20 | Disposition: A | Discharge status: Home / Self Care | Payer: Medicare PPO | Source: Ambulatory Visit | Attending: Acute Care | Admitting: Acute Care

## 2015-02-20 DIAGNOSIS — F17210 Nicotine dependence, cigarettes, uncomplicated: Secondary | ICD-10-CM | POA: Diagnosis not present

## 2015-02-21 ENCOUNTER — Telehealth: Payer: Self-pay | Admitting: Acute Care

## 2015-02-21 NOTE — Telephone Encounter (Signed)
I have called Emma Schneider and given her the results of her screening scan. I explained that the scan was read as a Lung RADs 2, nodules with a very low likelihood of becoming a clinically active cancer due to lack of size or growth. I explained that the recommendation is for a repeat scan in 12 months, which I told her we will call in Oct. 2017 to schedule. She verbalized understanding of the results and knows to call for any change in her health status. I will share the results with her PCP.

## 2015-07-18 ENCOUNTER — Other Ambulatory Visit: Payer: Self-pay | Admitting: Acute Care

## 2015-07-18 DIAGNOSIS — F17210 Nicotine dependence, cigarettes, uncomplicated: Principal | ICD-10-CM

## 2015-07-24 DIAGNOSIS — Z9221 Personal history of antineoplastic chemotherapy: Secondary | ICD-10-CM

## 2015-07-24 HISTORY — DX: Personal history of antineoplastic chemotherapy: Z92.21

## 2016-02-21 ENCOUNTER — Ambulatory Visit: Payer: Medicare PPO

## 2016-02-26 ENCOUNTER — Ambulatory Visit (INDEPENDENT_AMBULATORY_CARE_PROVIDER_SITE_OTHER)
Admission: RE | Admit: 2016-02-26 | Discharge: 2016-02-26 | Disposition: A | Discharge status: Home / Self Care | Payer: Medicare Other | Source: Ambulatory Visit | Attending: Acute Care | Admitting: Acute Care

## 2016-02-26 DIAGNOSIS — F17210 Nicotine dependence, cigarettes, uncomplicated: Principal | ICD-10-CM

## 2016-02-26 DIAGNOSIS — Z87891 Personal history of nicotine dependence: Secondary | ICD-10-CM

## 2016-02-28 ENCOUNTER — Telehealth: Payer: Self-pay | Admitting: Acute Care

## 2016-02-28 DIAGNOSIS — F17210 Nicotine dependence, cigarettes, uncomplicated: Secondary | ICD-10-CM

## 2016-02-28 NOTE — Telephone Encounter (Signed)
I have called the results of Mrs. Emma Schneider and read as low dose screening CT. Her husband took the call. I explained to him that her scan was read as a Lung RADS 2: nodules that are benign in appearance and behavior with a very low likelihood of becoming a clinically active cancer due to size or lack of growth. Recommendation per radiology is for a repeat LDCT in 12 months. I told him that we will see order and schedule Emma Schneider' s annual repeat scan in October 2018. Mr. Emma Schneider verbalized understanding of the above, and assured me that he would get these results to his wife. They have my contact information in the event they have questions or concerns in the future.

## 2016-03-15 ENCOUNTER — Encounter: Payer: Medicare PPO | Admitting: Adult Health

## 2016-03-20 ENCOUNTER — Telehealth: Payer: Self-pay | Admitting: Adult Health

## 2016-03-20 ENCOUNTER — Ambulatory Visit (INDEPENDENT_AMBULATORY_CARE_PROVIDER_SITE_OTHER): Payer: Medicare Other | Admitting: Adult Health

## 2016-03-20 ENCOUNTER — Encounter: Payer: Self-pay | Admitting: Adult Health

## 2016-03-20 VITALS — BP 124/74 | Temp 97.8°F | Ht 63.0 in | Wt 120.8 lb

## 2016-03-20 DIAGNOSIS — E559 Vitamin D deficiency, unspecified: Secondary | ICD-10-CM | POA: Diagnosis not present

## 2016-03-20 DIAGNOSIS — F17200 Nicotine dependence, unspecified, uncomplicated: Secondary | ICD-10-CM | POA: Diagnosis not present

## 2016-03-20 DIAGNOSIS — Z0000 Encounter for general adult medical examination without abnormal findings: Secondary | ICD-10-CM

## 2016-03-20 DIAGNOSIS — Z23 Encounter for immunization: Secondary | ICD-10-CM | POA: Diagnosis not present

## 2016-03-20 LAB — CBC WITH DIFFERENTIAL/PLATELET
BASOS ABS: 0 10*3/uL (ref 0.0–0.1)
BASOS PCT: 0.6 % (ref 0.0–3.0)
EOS ABS: 0 10*3/uL (ref 0.0–0.7)
Eosinophils Relative: 0.5 % (ref 0.0–5.0)
HEMATOCRIT: 40.4 % (ref 36.0–46.0)
Hemoglobin: 13.6 g/dL (ref 12.0–15.0)
LYMPHS ABS: 2 10*3/uL (ref 0.7–4.0)
LYMPHS PCT: 30.6 % (ref 12.0–46.0)
MCHC: 33.6 g/dL (ref 30.0–36.0)
MCV: 86.7 fl (ref 78.0–100.0)
MONO ABS: 0.6 10*3/uL (ref 0.1–1.0)
Monocytes Relative: 8.8 % (ref 3.0–12.0)
NEUTROS ABS: 3.9 10*3/uL (ref 1.4–7.7)
NEUTROS PCT: 59.5 % (ref 43.0–77.0)
PLATELETS: 308 10*3/uL (ref 150.0–400.0)
RBC: 4.66 Mil/uL (ref 3.87–5.11)
RDW: 13.3 % (ref 11.5–15.5)
WBC: 6.6 10*3/uL (ref 4.0–10.5)

## 2016-03-20 LAB — HEPATIC FUNCTION PANEL
ALBUMIN: 4.2 g/dL (ref 3.5–5.2)
ALK PHOS: 69 U/L (ref 39–117)
ALT: 10 U/L (ref 0–35)
AST: 14 U/L (ref 0–37)
BILIRUBIN DIRECT: 0.1 mg/dL (ref 0.0–0.3)
TOTAL PROTEIN: 6.9 g/dL (ref 6.0–8.3)
Total Bilirubin: 0.7 mg/dL (ref 0.2–1.2)

## 2016-03-20 LAB — POC URINALSYSI DIPSTICK (AUTOMATED)
BILIRUBIN UA: NEGATIVE
GLUCOSE UA: NEGATIVE
KETONES UA: NEGATIVE
NITRITE UA: NEGATIVE
Protein, UA: NEGATIVE
Spec Grav, UA: 1.02
Urobilinogen, UA: 0.2
pH, UA: 5.5

## 2016-03-20 LAB — TSH: TSH: 1.55 u[IU]/mL (ref 0.35–4.50)

## 2016-03-20 LAB — LIPID PANEL
CHOL/HDL RATIO: 4
Cholesterol: 204 mg/dL — ABNORMAL HIGH (ref 0–200)
HDL: 51 mg/dL (ref >39.00)
LDL Cholesterol: 138 mg/dL — ABNORMAL HIGH (ref 0–99)
NonHDL: 152.9
TRIGLYCERIDES: 76 mg/dL (ref 0.0–149.0)
VLDL: 15.2 mg/dL (ref 0.0–40.0)

## 2016-03-20 LAB — BASIC METABOLIC PANEL
BUN: 12 mg/dL (ref 6–23)
CALCIUM: 9.5 mg/dL (ref 8.4–10.5)
CHLORIDE: 104 meq/L (ref 96–112)
CO2: 30 meq/L (ref 19–32)
CREATININE: 0.76 mg/dL (ref 0.40–1.20)
GFR: 80.87 mL/min (ref >60.00)
GLUCOSE: 95 mg/dL (ref 70–99)
Potassium: 4.3 mEq/L (ref 3.5–5.1)
Sodium: 141 mEq/L (ref 135–145)

## 2016-03-20 LAB — VITAMIN D 25 HYDROXY (VIT D DEFICIENCY, FRACTURES): VITD: 21.75 ng/mL — AB (ref 30.00–100.00)

## 2016-03-20 MED ORDER — CHOLECALCIFEROL 75 MCG (3000 UT) PO TABS: 3000.0000 [IU] | ORAL_TABLET | Freq: Every day | ORAL | 3 refills | Status: DC

## 2016-03-20 NOTE — Patient Instructions (Addendum)
It was great seeing you!  Your exam was great! Continue to stay active and eat healthy.   I will follow up with you once I get your labs back  Quit smoking  I will see you in one year   Health Maintenance, Female Introduction Adopting a healthy lifestyle and getting preventive care can go a long way to promote health and wellness. Talk with your health care provider about what schedule of regular examinations is right for you. This is a good chance for you to check in with your provider about disease prevention and staying healthy. In between checkups, there are plenty of things you can do on your own. Experts have done a lot of research about which lifestyle changes and preventive measures are most likely to keep you healthy. Ask your health care provider for more information. Weight and diet Eat a healthy diet  Be sure to include plenty of vegetables, fruits, low-fat dairy products, and lean protein.  Do not eat a lot of foods high in solid fats, added sugars, or salt.  Get regular exercise. This is one of the most important things you can do for your health.  Most adults should exercise for at least 150 minutes each week. The exercise should increase your heart rate and make you sweat (moderate-intensity exercise).  Most adults should also do strengthening exercises at least twice a week. This is in addition to the moderate-intensity exercise. Maintain a healthy weight  Body mass index (BMI) is a measurement that can be used to identify possible weight problems. It estimates body fat based on height and weight. Your health care provider can help determine your BMI and help you achieve or maintain a healthy weight.  For females 30 years of age and older:  A BMI below 18.5 is considered underweight.  A BMI of 18.5 to 24.9 is normal.  A BMI of 25 to 29.9 is considered overweight.  A BMI of 30 and above is considered obese. Watch levels of cholesterol and blood lipids  You  should start having your blood tested for lipids and cholesterol at 66 years of age, then have this test every 5 years.  You may need to have your cholesterol levels checked more often if:  Your lipid or cholesterol levels are high.  You are older than 67 years of age.  You are at high risk for heart disease. Cancer screening Lung Cancer  Lung cancer screening is recommended for adults 46-50 years old who are at high risk for lung cancer because of a history of smoking.  A yearly low-dose CT scan of the lungs is recommended for people who:  Currently smoke.  Have quit within the past 15 years.  Have at least a 30-pack-year history of smoking. A pack year is smoking an average of one pack of cigarettes a day for 1 year.  Yearly screening should continue until it has been 15 years since you quit.  Yearly screening should stop if you develop a health problem that would prevent you from having lung cancer treatment. Breast Cancer  Practice breast self-awareness. This means understanding how your breasts normally appear and feel.  It also means doing regular breast self-exams. Let your health care provider know about any changes, no matter how small.  If you are in your 20s or 30s, you should have a clinical breast exam (CBE) by a health care provider every 1-3 years as part of a regular health exam.  If you are 40 or  older, have a CBE every year. Also consider having a breast X-ray (mammogram) every year.  If you have a family history of breast cancer, talk to your health care provider about genetic screening.  If you are at high risk for breast cancer, talk to your health care provider about having an MRI and a mammogram every year.  Breast cancer gene (BRCA) assessment is recommended for women who have family members with BRCA-related cancers. BRCA-related cancers include:  Breast.  Ovarian.  Tubal.  Peritoneal cancers.  Results of the assessment will determine the need  for genetic counseling and BRCA1 and BRCA2 testing. Cervical Cancer  Your health care provider may recommend that you be screened regularly for cancer of the pelvic organs (ovaries, uterus, and vagina). This screening involves a pelvic examination, including checking for microscopic changes to the surface of your cervix (Pap test). You may be encouraged to have this screening done every 3 years, beginning at age 36.  For women ages 72-65, health care providers may recommend pelvic exams and Pap testing every 3 years, or they may recommend the Pap and pelvic exam, combined with testing for human papilloma virus (HPV), every 5 years. Some types of HPV increase your risk of cervical cancer. Testing for HPV may also be done on women of any age with unclear Pap test results.  Other health care providers may not recommend any screening for nonpregnant women who are considered low risk for pelvic cancer and who do not have symptoms. Ask your health care provider if a screening pelvic exam is right for you.  If you have had past treatment for cervical cancer or a condition that could lead to cancer, you need Pap tests and screening for cancer for at least 20 years after your treatment. If Pap tests have been discontinued, your risk factors (such as having a new sexual partner) need to be reassessed to determine if screening should resume. Some women have medical problems that increase the chance of getting cervical cancer. In these cases, your health care provider may recommend more frequent screening and Pap tests. Colorectal Cancer  This type of cancer can be detected and often prevented.  Routine colorectal cancer screening usually begins at 66 years of age and continues through 66 years of age.  Your health care provider may recommend screening at an earlier age if you have risk factors for colon cancer.  Your health care provider may also recommend using home test kits to check for hidden blood in the  stool.  A small camera at the end of a tube can be used to examine your colon directly (sigmoidoscopy or colonoscopy). This is done to check for the earliest forms of colorectal cancer.  Routine screening usually begins at age 24.  Direct examination of the colon should be repeated every 5-10 years through 67 years of age. However, you may need to be screened more often if early forms of precancerous polyps or small growths are found. Skin Cancer  Check your skin from head to toe regularly.  Tell your health care provider about any new moles or changes in moles, especially if there is a change in a mole's shape or color.  Also tell your health care provider if you have a mole that is larger than the size of a pencil eraser.  Always use sunscreen. Apply sunscreen liberally and repeatedly throughout the day.  Protect yourself by wearing long sleeves, pants, a wide-brimmed hat, and sunglasses whenever you are outside. Heart disease,  diabetes, and high blood pressure  High blood pressure causes heart disease and increases the risk of stroke. High blood pressure is more likely to develop in:  People who have blood pressure in the high end of the normal range (130-139/85-89 mm Hg).  People who are overweight or obese.  People who are African American.  If you are 61-67 years of age, have your blood pressure checked every 3-5 years. If you are 5 years of age or older, have your blood pressure checked every year. You should have your blood pressure measured twice-once when you are at a hospital or clinic, and once when you are not at a hospital or clinic. Record the average of the two measurements. To check your blood pressure when you are not at a hospital or clinic, you can use:  An automated blood pressure machine at a pharmacy.  A home blood pressure monitor.  If you are between 24 years and 37 years old, ask your health care provider if you should take aspirin to prevent  strokes.  Have regular diabetes screenings. This involves taking a blood sample to check your fasting blood sugar level.  If you are at a normal weight and have a low risk for diabetes, have this test once every three years after 66 years of age.  If you are overweight and have a high risk for diabetes, consider being tested at a younger age or more often. Preventing infection Hepatitis B  If you have a higher risk for hepatitis B, you should be screened for this virus. You are considered at high risk for hepatitis B if:  You were born in a country where hepatitis B is common. Ask your health care provider which countries are considered high risk.  Your parents were born in a high-risk country, and you have not been immunized against hepatitis B (hepatitis B vaccine).  You have HIV or AIDS.  You use needles to inject street drugs.  You live with someone who has hepatitis B.  You have had sex with someone who has hepatitis B.  You get hemodialysis treatment.  You take certain medicines for conditions, including cancer, organ transplantation, and autoimmune conditions. Hepatitis C  Blood testing is recommended for:  Everyone born from [birth year] through 1966.  Anyone with known risk factors for hepatitis C. Sexually transmitted infections (STIs)  You should be screened for sexually transmitted infections (STIs) including gonorrhea and chlamydia if:  You are sexually active and are younger than 66 years of age.  You are older than 66 years of age and your health care provider tells you that you are at risk for this type of infection.  Your sexual activity has changed since you were last screened and you are at an increased risk for chlamydia or gonorrhea. Ask your health care provider if you are at risk.  If you do not have HIV, but are at risk, it may be recommended that you take a prescription medicine daily to prevent HIV infection. This is called pre-exposure prophylaxis  (PrEP). You are considered at risk if:  You are sexually active and do not regularly use condoms or know the HIV status of your partner(s).  You take drugs by injection.  You are sexually active with a partner who has HIV. Talk with your health care provider about whether you are at high risk of being infected with HIV. If you choose to begin PrEP, you should first be tested for HIV. You should then be tested  every 3 months for as long as you are taking PrEP. Pregnancy  If you are premenopausal and you may become pregnant, ask your health care provider about preconception counseling.  If you may become pregnant, take 400 to 800 micrograms (mcg) of folic acid every day.  If you want to prevent pregnancy, talk to your health care provider about birth control (contraception). Osteoporosis and menopause  Osteoporosis is a disease in which the bones lose minerals and strength with aging. This can result in serious bone fractures. Your risk for osteoporosis can be identified using a bone density scan.  If you are 52 years of age or older, or if you are at risk for osteoporosis and fractures, ask your health care provider if you should be screened.  Ask your health care provider whether you should take a calcium or vitamin D supplement to lower your risk for osteoporosis.  Menopause may have certain physical symptoms and risks.  Hormone replacement therapy may reduce some of these symptoms and risks. Talk to your health care provider about whether hormone replacement therapy is right for you. Follow these instructions at home:  Schedule regular health, dental, and eye exams.  Stay current with your immunizations.  Do not use any tobacco products including cigarettes, chewing tobacco, or electronic cigarettes.  If you are pregnant, do not drink alcohol.  If you are breastfeeding, limit how much and how often you drink alcohol.  Limit alcohol intake to no more than 1 drink per day for  nonpregnant women. One drink equals 12 ounces of beer, 5 ounces of wine, or 1 ounces of hard liquor.  Do not use street drugs.  Do not share needles.  Ask your health care provider for help if you need support or information about quitting drugs.  Tell your health care provider if you often feel depressed.  Tell your health care provider if you have ever been abused or do not feel safe at home. This information is not intended to replace advice given to you by your health care provider. Make sure you discuss any questions you have with your health care provider. Document Released: 10/29/2010 Document Revised: 09/21/2015 Document Reviewed: 01/17/2015  2017 Elsevier

## 2016-03-20 NOTE — Telephone Encounter (Signed)
Dated picture on her labs from her physical. Will call in prescription for vitamin D I will increase it from 1000 units to 3000 units

## 2016-03-20 NOTE — Progress Notes (Signed)
Subjective:    Patient ID: Emma Schneider, female    DOB: [birth year], 65 y.o.   MRN: [id number]  HPI Patient presents for yearly preventative medicine examination. 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  All immunizations and health maintenance protocols were reviewed with the patient and needed orders were placed.  Appropriate screening laboratory values were ordered for the patient including screening of hyperlipidemia, renal function and hepatic function.  Medication reconciliation,  past medical history, social history, problem list and allergies were reviewed in detail with the patient  Goals were established with regard to weight loss, exercise, and  diet in compliance with medications. She does not follow a specific diet but does not eat a lot of processed foods. For exercise she likes to walk  She is being seen by [facility] for hematuria and Endocrinology for accidental finding of right adrenal incidentaloma.   End of life planning was discussed. She does have a living will and advanced directive  She is up to date on dental visits and vision care. Her last colonoscopy was in 2013 - 10 year plan. She is seeing her GYN next week and will have her mammogram there.   She continues to smoke and is down to less than half a pack. She does not want help with quitting smoking.   She has no acute concerns today   Review of Systems  Constitutional: Negative.   HENT: Negative.   Eyes: Negative.   Respiratory: Negative.   Cardiovascular: Negative.   Gastrointestinal: Negative.   Endocrine: Negative.   Genitourinary: Negative.   Musculoskeletal: Negative.   Skin: Negative.   Allergic/Immunologic: Negative.   Neurological: Negative.   Hematological: Negative.   Psychiatric/Behavioral: Negative.   All other systems reviewed and are  negative.  Past Medical History:  Diagnosis Date  . 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 Blood in urine   . Chicken pox   . Leukemia ([place])   . Vitamin D deficiency     Social History   Social History  . Marital status: Married    Spouse name: N/A  . Number of children: N/A  . Years of education: N/A   Occupational History  . Not on file.   Social History Main Topics  . Smoking status: Current Every Day Smoker    Packs/day: 1.00    Years: 42.00    Types: Cigarettes  . Smokeless tobacco: Not on file     Comment: Is actively trying to cut down number of cigarettes, but not ready to set a quit date.  . Alcohol use No  . Drug use: No  . Sexual activity: Not on file   Other Topics Concern  . 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    -Married for 30 years    - Has step children, all live in [place]   - No pets   - Plays games on the computer and enjoys yard work.        No past surgical history on file.  Family History  Problem Relation Age of Onset  . Congestive Heart Failure Mother   . Heart attack Father   . Heart disease Father     No Known Allergies  Current Outpatient Prescriptions on File Prior to Visit  Medication Sig Dispense  Refill  . calcium carbonate (OS-CAL) 600 MG TABS tablet Take 1,200 mg by mouth daily.     No current facility-administered medications on file prior to visit.     BP 124/74   Temp 97.8 F (36.6 C) (Oral)   Ht 5\' 3"  (1.6 m)   Wt 120 lb 12.8 oz (54.8 kg)   BMI 21.40 kg/m       Objective:   Physical Exam  Constitutional: She is oriented to person, place, and time. She appears well-developed and well-nourished. No distress.  HENT:  Head: Normocephalic and atraumatic.  Right Ear: External ear normal.  Left Ear: External ear normal.  Nose: Nose normal.  Mouth/Throat: Oropharynx is clear and moist. No oropharyngeal exudate.  Eyes: Conjunctivae are normal. Right eye exhibits no  discharge. Left eye exhibits no discharge. No scleral icterus.  Neck: Trachea normal and normal range of motion. Neck supple. No JVD present. No tracheal tenderness present. Carotid bruit is not present. No tracheal deviation present. No thyroid mass present.  Cardiovascular: Normal rate, regular rhythm, normal heart sounds and intact distal pulses.  Exam reveals no gallop and no friction rub.   No murmur heard. Pulmonary/Chest: Effort normal and breath sounds normal. No stridor. No respiratory distress. She has no wheezes. She has no rales. She exhibits no tenderness.  Abdominal: Soft. Normal appearance, normal aorta and bowel sounds are normal. She exhibits no distension and no mass. There is no hepatosplenomegaly, splenomegaly or hepatomegaly. There is no tenderness. There is no rebound, no guarding and no CVA tenderness.  Musculoskeletal: Normal range of motion.  Lymphadenopathy:    She has no cervical adenopathy.  Neurological: She is alert and oriented to person, place, and time. She has normal reflexes. She displays normal reflexes. No cranial nerve deficit. She exhibits normal muscle tone. Coordination normal.  Skin: Skin is warm and dry. No rash noted. No erythema. No pallor.  Psychiatric: She has a normal mood and affect. Her behavior is normal. Judgment and thought content normal.  Nursing note and vitals reviewed.     Assessment & Plan:  1. Routine general medical examination at a health care facility -  Continue to eat healthy and stay active.  - Follow up in one year or sooner if needed - EKG 12-Lead- Sinus  Rhythm , Rate 68 - Basic metabolic panel - CBC with Differential/Platelet - Hepatic function panel - Lipid panel - POCT Urinalysis Dipstick (Automated) - TSH - Vitamin D, 25-hydroxy  2. Tobacco use disorder -  Educated on the importance of quitting smoking.  She does not want any additional help at this time. She will inform me if she does.   3. Need for 23-polyvalent  pneumococcal polysaccharide vaccine  - Pneumococcal polysaccharide vaccine 23-valent greater than or equal to 2yo subcutaneous/IM  4. Need for prophylactic vaccination and inoculation against influenza - Flu vaccine HIGH DOSE PF (Fluzone High dose)  5. Vitamin D deficiency - Consider increasing vitamin D 3 dose  - Vitamin D, 25-hydroxy  Dorothyann Peng, NP

## 2016-03-29 ENCOUNTER — Other Ambulatory Visit: Payer: Self-pay | Admitting: Obstetrics and Gynecology

## 2016-03-29 DIAGNOSIS — E2839 Other primary ovarian failure: Secondary | ICD-10-CM

## 2016-04-10 ENCOUNTER — Ambulatory Visit
Admission: RE | Admit: 2016-04-10 | Discharge: 2016-04-10 | Disposition: A | Discharge status: Home / Self Care | Payer: Medicare Other | Source: Ambulatory Visit | Attending: Obstetrics and Gynecology | Admitting: Obstetrics and Gynecology

## 2016-04-10 DIAGNOSIS — E2839 Other primary ovarian failure: Secondary | ICD-10-CM

## 2016-09-27 DIAGNOSIS — Z860100 Personal history of colon polyps, unspecified: Secondary | ICD-10-CM | POA: Insufficient documentation

## 2016-09-27 HISTORY — DX: Personal history of colon polyps, unspecified: Z86.0100

## 2016-10-02 ENCOUNTER — Ambulatory Visit (INDEPENDENT_AMBULATORY_CARE_PROVIDER_SITE_OTHER): Payer: Medicare Other | Admitting: Adult Health

## 2016-10-02 ENCOUNTER — Encounter: Payer: Self-pay | Admitting: Adult Health

## 2016-10-02 NOTE — Progress Notes (Signed)
Erroneous note

## 2016-10-25 LAB — HM COLONOSCOPY

## 2016-11-08 ENCOUNTER — Encounter: Payer: Self-pay | Admitting: Family Medicine

## 2017-01-17 ENCOUNTER — Encounter: Payer: Self-pay | Admitting: Adult Health

## 2017-01-21 ENCOUNTER — Ambulatory Visit (INDEPENDENT_AMBULATORY_CARE_PROVIDER_SITE_OTHER): Payer: Medicare Other

## 2017-01-21 ENCOUNTER — Encounter: Payer: Self-pay | Admitting: Adult Health

## 2017-01-21 DIAGNOSIS — Z23 Encounter for immunization: Secondary | ICD-10-CM | POA: Diagnosis not present

## 2017-02-26 ENCOUNTER — Ambulatory Visit (INDEPENDENT_AMBULATORY_CARE_PROVIDER_SITE_OTHER)
Admission: RE | Admit: 2017-02-26 | Discharge: 2017-02-26 | Disposition: A | Discharge status: Home / Self Care | Payer: Medicare Other | Source: Ambulatory Visit | Attending: Acute Care | Admitting: Acute Care

## 2017-02-26 DIAGNOSIS — Z87891 Personal history of nicotine dependence: Secondary | ICD-10-CM | POA: Diagnosis not present

## 2017-02-26 DIAGNOSIS — F17210 Nicotine dependence, cigarettes, uncomplicated: Secondary | ICD-10-CM

## 2017-03-04 ENCOUNTER — Telehealth: Payer: Self-pay | Admitting: Acute Care

## 2017-03-04 DIAGNOSIS — F17210 Nicotine dependence, cigarettes, uncomplicated: Principal | ICD-10-CM

## 2017-03-04 DIAGNOSIS — Z122 Encounter for screening for malignant neoplasm of respiratory organs: Secondary | ICD-10-CM

## 2017-03-05 NOTE — Telephone Encounter (Signed)
Agree with plan to repeat LDCT in 3 months to look for stability, interval change. Thanks./

## 2017-03-07 NOTE — Telephone Encounter (Signed)
Please order follow up LDCT in 3 months and fax results to patient's PCP. I have called the patient and explained her scan was read as a Lung RADS 4 A : suspicious findings, either short term follow up in 3 months or alternatively  PET Scan evaluation may be considered when there is a solid component of  8 mm or larger.I explained that I had Dr. Lamonte Sakai review the scan with me and we feel the best plan at this point is for a follow up Ct in 3 months ( End of Jan 2019). She verbalized understanding and had no further questions at completion of the call.  She has the office contact numbers in the event she needs to contact us prior to the scan. I explained we will order and schedule the follow up scan for 04/2017, and would be in touch regarding scheduling the imaging.

## 2017-03-11 NOTE — Telephone Encounter (Signed)
Order placed for CT chest LCS nodule f/u in 3 months.  Results faxed to [facility].

## 2017-05-29 ENCOUNTER — Ambulatory Visit (INDEPENDENT_AMBULATORY_CARE_PROVIDER_SITE_OTHER)
Admission: RE | Admit: 2017-05-29 | Discharge: 2017-05-29 | Disposition: A | Discharge status: Home / Self Care | Payer: Medicare Other | Source: Ambulatory Visit | Attending: Acute Care | Admitting: Acute Care

## 2017-05-29 DIAGNOSIS — F17210 Nicotine dependence, cigarettes, uncomplicated: Secondary | ICD-10-CM

## 2017-05-29 DIAGNOSIS — Z122 Encounter for screening for malignant neoplasm of respiratory organs: Secondary | ICD-10-CM

## 2017-05-29 DIAGNOSIS — R918 Other nonspecific abnormal finding of lung field: Secondary | ICD-10-CM | POA: Diagnosis not present

## 2017-06-06 ENCOUNTER — Other Ambulatory Visit: Payer: Self-pay | Admitting: Acute Care

## 2017-06-06 DIAGNOSIS — F17210 Nicotine dependence, cigarettes, uncomplicated: Principal | ICD-10-CM

## 2017-06-06 DIAGNOSIS — Z122 Encounter for screening for malignant neoplasm of respiratory organs: Secondary | ICD-10-CM

## 2017-06-17 ENCOUNTER — Telehealth: Payer: Self-pay | Admitting: Family Medicine

## 2017-06-17 NOTE — Telephone Encounter (Signed)
Copied from [place]. Topic: Referral - Request >> Jun 16, 2017  1:51 PM Margot Ables wrote: Reason for CRM: pt wanting referral to [facility] to establish care. Pt is not due for colonoscopy as one was done recently. Her provider at [facility] is leaving and she is wanting to establish with [facility] in the future

## 2017-06-24 NOTE — Telephone Encounter (Signed)
Noted  

## 2017-06-24 NOTE — Telephone Encounter (Signed)
Left a message for a return call.  Need reason for referral.

## 2017-06-24 NOTE — Telephone Encounter (Signed)
Pt's husband had called and spoke w/ a nurse about switching to [facility] b/c their current physician who does they're colonoscopy is retiring; misunderstanding. Pt will contact [facility] when colonoscopy is due. NO NEED FOR A REFERRAL

## 2017-10-07 ENCOUNTER — Encounter: Payer: Self-pay | Admitting: Adult Health

## 2017-10-07 ENCOUNTER — Ambulatory Visit (INDEPENDENT_AMBULATORY_CARE_PROVIDER_SITE_OTHER): Payer: Medicare Other | Admitting: Adult Health

## 2017-10-07 VITALS — BP 138/70 | Temp 98.3°F | Ht 64.0 in | Wt 117.0 lb

## 2017-10-07 DIAGNOSIS — F17200 Nicotine dependence, unspecified, uncomplicated: Secondary | ICD-10-CM

## 2017-10-07 DIAGNOSIS — E278 Other specified disorders of adrenal gland: Secondary | ICD-10-CM | POA: Diagnosis not present

## 2017-10-07 DIAGNOSIS — E559 Vitamin D deficiency, unspecified: Secondary | ICD-10-CM | POA: Diagnosis not present

## 2017-10-07 DIAGNOSIS — Z0000 Encounter for general adult medical examination without abnormal findings: Secondary | ICD-10-CM

## 2017-10-07 DIAGNOSIS — E785 Hyperlipidemia, unspecified: Secondary | ICD-10-CM | POA: Diagnosis not present

## 2017-10-07 LAB — HEPATIC FUNCTION PANEL
ALBUMIN: 4.3 g/dL (ref 3.5–5.2)
ALT: 9 U/L (ref 0–35)
AST: 14 U/L (ref 0–37)
Alkaline Phosphatase: 55 U/L (ref 39–117)
Bilirubin, Direct: 0.1 mg/dL (ref 0.0–0.3)
TOTAL PROTEIN: 6.9 g/dL (ref 6.0–8.3)
Total Bilirubin: 0.4 mg/dL (ref 0.2–1.2)

## 2017-10-07 LAB — BASIC METABOLIC PANEL
BUN: 11 mg/dL (ref 6–23)
CHLORIDE: 102 meq/L (ref 96–112)
CO2: 31 meq/L (ref 19–32)
Calcium: 9.5 mg/dL (ref 8.4–10.5)
Creatinine, Ser: 0.81 mg/dL (ref 0.40–1.20)
GFR: 74.79 mL/min (ref >60.00)
Glucose, Bld: 89 mg/dL (ref 70–99)
POTASSIUM: 4.2 meq/L (ref 3.5–5.1)
SODIUM: 140 meq/L (ref 135–145)

## 2017-10-07 LAB — VITAMIN D 25 HYDROXY (VIT D DEFICIENCY, FRACTURES): VITD: 27.35 ng/mL — AB (ref 30.00–100.00)

## 2017-10-07 LAB — CBC WITH DIFFERENTIAL/PLATELET
BASOS PCT: 0.7 % (ref 0.0–3.0)
Basophils Absolute: 0 10*3/uL (ref 0.0–0.1)
EOS PCT: 0.8 % (ref 0.0–5.0)
Eosinophils Absolute: 0 10*3/uL (ref 0.0–0.7)
HEMATOCRIT: 41.6 % (ref 36.0–46.0)
Hemoglobin: 14.3 g/dL (ref 12.0–15.0)
Lymphocytes Relative: 34 % (ref 12.0–46.0)
Lymphs Abs: 1.9 10*3/uL (ref 0.7–4.0)
MCHC: 34.3 g/dL (ref 30.0–36.0)
MCV: 87.9 fl (ref 78.0–100.0)
MONOS PCT: 7.3 % (ref 3.0–12.0)
Monocytes Absolute: 0.4 10*3/uL (ref 0.1–1.0)
NEUTROS ABS: 3.2 10*3/uL (ref 1.4–7.7)
Neutrophils Relative %: 57.2 % (ref 43.0–77.0)
PLATELETS: 316 10*3/uL (ref 150.0–400.0)
RBC: 4.74 Mil/uL (ref 3.87–5.11)
RDW: 13.7 % (ref 11.5–15.5)
WBC: 5.6 10*3/uL (ref 4.0–10.5)

## 2017-10-07 LAB — LIPID PANEL
Cholesterol: 224 mg/dL — ABNORMAL HIGH (ref 0–200)
HDL: 41.9 mg/dL (ref >39.00)
LDL Cholesterol: 150 mg/dL — ABNORMAL HIGH (ref 0–99)
NONHDL: 181.9
TRIGLYCERIDES: 160 mg/dL — AB (ref 0.0–149.0)
Total CHOL/HDL Ratio: 5
VLDL: 32 mg/dL (ref 0.0–40.0)

## 2017-10-07 LAB — TSH: TSH: 2.97 u[IU]/mL (ref 0.35–4.50)

## 2017-10-07 NOTE — Progress Notes (Signed)
Subjective:    Patient ID: Emma Schneider, female    DOB: 1950-05-18, 67 y.o.   MRN: [id number]  HPI Patient presents for yearly preventative medicine examination. 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   Osteopenia -restarted on Actonel 35 mg by GYN.  She is seen by [facility] urology for hematuria and endocrinology for incidental finding of right adrenal incidentaloma.     She is seen by hematology at [facility] for history of leukemia (AML)  Hyperlipidemia - History of. Not on any medication. Recent CT of chest showed Aortic Atherosclerosis.   Tobacco Use - she is smoking one in the morning and one at night. She has been working on cutting back.   All immunizations and health maintenance protocols were reviewed with the patient and needed orders were placed. Is UTD on vaccinations.   Appropriate screening laboratory values were ordered for the patient including screening of hyperlipidemia, renal function and hepatic function.  Medication reconciliation,  past medical history, social history, problem list and allergies were reviewed in detail with the patient  Goals were established with regard to weight loss, exercise, and  diet in compliance with medications. She has cut out red meat and continues to walk multiple times per week.   End of life planning was discussed.  She has an advanced directive and living will  She is up-to-date on her colonoscopy ( 2018), GYN and mammogram.  Participates in routine dental and vision screens.  She denies any acute complaints.   Review of Systems  Constitutional: Negative.   HENT: Negative.   Eyes: Negative.   Respiratory: Negative.   Cardiovascular: Negative.   Gastrointestinal: Negative.   Endocrine: Negative.   Genitourinary: Negative.   Musculoskeletal: Negative.   Skin: Negative.   Allergic/Immunologic:  Negative.   Neurological: Negative.   Hematological: Negative.   Psychiatric/Behavioral: Negative.    Past Medical History:  Diagnosis Date  . 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 Blood in urine   . Chicken pox   . Leukemia ([place])   . Vitamin D deficiency     Social History   Socioeconomic History  . Marital status: Married    Spouse name: Not on file  . Number of children: Not on file  . Years of education: Not on file  . Highest education level: Not on file  Occupational History  . Not on file  Social Needs  . Financial resource strain: Not on file  . Food insecurity:    Worry: Not on file    Inability: Not on file  . Transportation needs:    Medical: Not on file    Non-medical: Not on file  Tobacco Use  . Smoking status: Current Every Day Smoker    Packs/day: 1.00    Years: 42.00    Pack years: 42.00    Types: Cigarettes  . Smokeless tobacco: Never Used  . Tobacco comment: Is actively trying to cut down number of cigarettes, but not ready to set a quit date.  Substance and Sexual Activity  . Alcohol use: No    Alcohol/week: 0.0 oz  . Drug use: No  . Sexual activity: Not on file  Lifestyle  . Physical activity:    Days per week: Not on file    Minutes per session: Not on file  . Stress: Not on file  Relationships  .  Social connections:    Talks on phone: Not on file    Gets together: Not on file    Attends religious service: Not on file    Active member of club or organization: Not on file    Attends meetings of clubs or organizations: Not on file    Relationship status: Not on file  . Intimate partner violence:    Fear of current or ex partner: Not on file    Emotionally abused: Not on file    Physically abused: Not on file    Forced sexual activity: Not on file  Other Topics Concern  . 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    -Married for 30 years    - Has step children, all live in [place]   - No pets    - Plays games on the computer and enjoys yard work.     History reviewed. No pertinent surgical history.  Family History  Problem Relation Age of Onset  . Congestive Heart Failure Mother   . Heart attack Father   . Heart disease Father     No Known Allergies  Current Outpatient Medications on File Prior to Visit  Medication Sig Dispense Refill  . calcium carbonate (OS-CAL) 600 MG TABS tablet Take 1,200 mg by mouth daily.    . Cholecalciferol 3000 units TABS Take 3,000 Units by mouth daily. 90 tablet 3  . risedronate (ACTONEL) 35 MG tablet Take 35 mg by mouth every 7 (seven) days.      No current facility-administered medications on file prior to visit.     BP 138/70   Temp 98.3 F (36.8 C) (Oral)   Ht 5\' 4"  (1.626 m)   Wt 117 lb (53.1 kg)   BMI 20.08 kg/m       Objective:   Physical Exam  Constitutional: She is oriented to person, place, and time. She appears well-developed and well-nourished. No distress.  HENT:  Head: Normocephalic and atraumatic.  Right Ear: External ear normal.  Left Ear: External ear normal.  Nose: Nose normal.  Mouth/Throat: Oropharynx is clear and moist. No oropharyngeal exudate.  Eyes: Pupils are equal, round, and reactive to light. Conjunctivae and EOM are normal. Right eye exhibits no discharge. Left eye exhibits no discharge. No scleral icterus.  Neck: Normal range of motion. Neck supple. No JVD present. No tracheal deviation present. No thyromegaly present.  Cardiovascular: Normal rate, regular rhythm, normal heart sounds and intact distal pulses. Exam reveals no gallop and no friction rub.  No murmur heard. Pulmonary/Chest: Effort normal and breath sounds normal. No respiratory distress. She has no wheezes. She has no rales. She exhibits no tenderness.  Abdominal: Soft. Bowel sounds are normal. She exhibits no distension and no mass. There is no tenderness. There is no rebound and no guarding. No hernia.  Genitourinary:  Genitourinary  Comments: Done by GYN  Musculoskeletal: Normal range of motion. She exhibits no edema or deformity.  Lymphadenopathy:    She has no cervical adenopathy.  Neurological: She is alert and oriented to person, place, and time. She displays normal reflexes. No cranial nerve deficit or sensory deficit. Coordination normal.  Skin: Skin is warm and dry. Capillary refill takes less than 2 seconds. No rash noted. No erythema. No pallor.  Psychiatric: She has a normal mood and affect. Her behavior is normal. Judgment and thought content normal.  Nursing note and vitals reviewed.  Assessment & Plan:  1. Routine general medical examination at a health care facility - Continue with diet and exercise.  - Follow up in one year or sooner if needed - Basic metabolic panel - CBC with Differential/Platelet - Hepatic function panel - Lipid panel - TSH  2. Tobacco use disorder - Encouraged to quit smoking completely.   3. 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 Vitamin D deficiency  - Vitamin D, 25-hydroxy  5. Hyperlipidemia, unspecified hyperlipidemia type - Consider statin  - Basic metabolic panel - CBC with Differential/Platelet - Hepatic function panel - Lipid panel - TSH   Dorothyann Peng, NP

## 2017-10-08 ENCOUNTER — Other Ambulatory Visit: Payer: Self-pay | Admitting: Family Medicine

## 2017-10-08 MED ORDER — ATORVASTATIN CALCIUM 10 MG PO TABS: 10.0000 mg | ORAL_TABLET | Freq: Every day | ORAL | 3 refills | Status: DC

## 2018-02-09 ENCOUNTER — Ambulatory Visit (INDEPENDENT_AMBULATORY_CARE_PROVIDER_SITE_OTHER): Payer: Medicare Other

## 2018-02-09 DIAGNOSIS — Z23 Encounter for immunization: Secondary | ICD-10-CM | POA: Diagnosis not present

## 2018-02-25 ENCOUNTER — Encounter: Payer: Medicare Other | Admitting: Adult Health

## 2018-04-16 LAB — HM PAP SMEAR

## 2018-04-16 LAB — RESULTS CONSOLE HPV: CHL HPV: NEGATIVE

## 2018-04-23 ENCOUNTER — Other Ambulatory Visit: Payer: Self-pay | Admitting: Obstetrics and Gynecology

## 2018-04-23 DIAGNOSIS — M8580 Other specified disorders of bone density and structure, unspecified site: Secondary | ICD-10-CM

## 2018-04-23 DIAGNOSIS — E2839 Other primary ovarian failure: Secondary | ICD-10-CM

## 2018-06-01 ENCOUNTER — Ambulatory Visit (INDEPENDENT_AMBULATORY_CARE_PROVIDER_SITE_OTHER)
Admission: RE | Admit: 2018-06-01 | Discharge: 2018-06-01 | Disposition: A | Discharge status: Home / Self Care | Payer: Medicare Other | Source: Ambulatory Visit | Attending: Acute Care | Admitting: Acute Care

## 2018-06-01 DIAGNOSIS — F17210 Nicotine dependence, cigarettes, uncomplicated: Secondary | ICD-10-CM

## 2018-06-01 DIAGNOSIS — Z122 Encounter for screening for malignant neoplasm of respiratory organs: Secondary | ICD-10-CM

## 2018-06-04 ENCOUNTER — Other Ambulatory Visit: Payer: Self-pay | Admitting: Acute Care

## 2018-06-04 DIAGNOSIS — Z122 Encounter for screening for malignant neoplasm of respiratory organs: Secondary | ICD-10-CM

## 2018-06-04 DIAGNOSIS — Z87891 Personal history of nicotine dependence: Secondary | ICD-10-CM

## 2018-06-04 DIAGNOSIS — F17210 Nicotine dependence, cigarettes, uncomplicated: Principal | ICD-10-CM

## 2018-06-29 ENCOUNTER — Telehealth: Payer: Self-pay

## 2018-06-29 NOTE — Telephone Encounter (Signed)
Author returned phone call. Appointment scheduled for 3/11 at 10AM. No other concerns at this time.   Copied from  [id number]. Topic: Appointment Scheduling - Scheduling Inquiry for Clinic >> Jun 29, 2018  4:02 PM Alanda Slim E wrote: Reason for CRM: Pt called to schedule medicare wellness and received an auto call from [facility] / please advise

## 2018-07-08 ENCOUNTER — Ambulatory Visit: Payer: Medicare Other

## 2018-10-06 ENCOUNTER — Other Ambulatory Visit: Payer: Self-pay

## 2018-10-06 ENCOUNTER — Ambulatory Visit (INDEPENDENT_AMBULATORY_CARE_PROVIDER_SITE_OTHER): Payer: Medicare Other | Admitting: Adult Health

## 2018-10-06 ENCOUNTER — Encounter: Payer: Self-pay | Admitting: Adult Health

## 2018-10-06 DIAGNOSIS — L247 Irritant contact dermatitis due to plants, except food: Secondary | ICD-10-CM

## 2018-10-06 MED ORDER — HYDROXYZINE HCL 25 MG PO TABS: 25.0000 mg | ORAL_TABLET | Freq: Three times a day (TID) | ORAL | 0 refills | Status: AC | PRN

## 2018-10-06 MED ORDER — PREDNISONE 10 MG PO TABS: ORAL_TABLET | ORAL | 0 refills | Status: DC

## 2018-10-06 NOTE — Progress Notes (Signed)
Virtual Visit via Video Note  I connected with Junius Roads on 10/06/18 at  3:30 PM EDT by a video enabled telemedicine application and verified that I am speaking with the correct person using two identifiers.  Location patient: home Location provider:work or home office Persons participating in the virtual visit: patient, provider  I discussed the limitations of evaluation and management by telemedicine and the availability of in person appointments. The patient expressed understanding and agreed to proceed.   HPI: The 68-year-old female who is being evaluated today for concern of poison ivy exposure.  She was working in her yard last week and he came in contact with poison ivy or poison oak.  She endorses a red, severely itchy, welting rash on bilateral arms chest and back of the neck.  Has been using calamine lotion without much improvement   ROS: See pertinent positives and negatives per HPI.  Past Medical History:  Diagnosis Date  . 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 Aortic atherosclerosis ([place])   . Blood in urine   . Chicken pox   . Hyperlipidemia   . Leukemia ([place])   . Vitamin D deficiency     No past surgical history on file.  Family History  Problem Relation Age of Onset  . Congestive Heart Failure Mother   . Heart attack Father   . Heart disease Father      Current Outpatient Medications:  .  atorvastatin (LIPITOR) 10 MG tablet, Take 1 tablet (10 mg total) by mouth daily., Disp: 90 tablet, Rfl: 3 .  calcium carbonate (OS-CAL) 600 MG TABS tablet, Take 1,200 mg by mouth daily., Disp: , Rfl:  .  Cholecalciferol 3000 units TABS, Take 3,000 Units by mouth daily., Disp: 90 tablet, Rfl: 3 .  hydrOXYzine (ATARAX/VISTARIL) 25 MG tablet, Take 1 tablet (25 mg total) by mouth every 8 (eight) hours as needed for up to 5 days for itching., Disp: 15 tablet, Rfl: 0 .  predniSONE (DELTASONE) 10 MG tablet, Takes two tabs ( 20 mg) x 7 days then 1 tab ( 10 mg) x 7 days, Disp: 21  tablet, Rfl: 0 .  risedronate (ACTONEL) 35 MG tablet, Take 35 mg by mouth every 7 (seven) days. , Disp: , Rfl:   EXAM:  VITALS per patient if applicable:  GENERAL: alert, oriented, appears well and in no acute distress  HEENT: atraumatic, conjunttiva clear, no obvious abnormalities on inspection of external nose and ears  NECK: normal movements of the head and neck  LUNGS: on inspection no signs of respiratory distress, breathing rate appears normal, no obvious gross SOB, gasping or wheezing  CV: no obvious cyanosis  MS: moves all visible extremities without noticeable abnormality  PSYCH/NEURO: pleasant and cooperative, no obvious depression or anxiety, speech and thought processing grossly intact  SKIN: Red raised rash with blistering and oozing noted throughout bilateral upper extremities.  ASSESSMENT AND PLAN:  Discussed the following assessment and plan:  Irritant contact dermatitis due to plants, except food - Plan: predniSONE (DELTASONE) 10 MG tablet, hydrOXYzine (ATARAX/VISTARIL) 25 MG tablet  -Advised to continue with calamine lotion as well as can try oatmeal baths, cool compresses would also help with symptom relief.  Advised to monitor for signs and symptoms of infection and to follow-up immediately if needed.    I discussed the assessment and treatment plan with the patient. The patient was provided an opportunity to ask questions and all were answered. The patient agreed with the plan and demonstrated an understanding of  the instructions.   The patient was advised to call back or seek an in-person evaluation if the symptoms worsen or if the condition fails to improve as anticipated.   Dorothyann Peng, NP

## 2018-10-09 ENCOUNTER — Encounter: Payer: Medicare Other | Admitting: Adult Health

## 2018-10-14 ENCOUNTER — Ambulatory Visit (INDEPENDENT_AMBULATORY_CARE_PROVIDER_SITE_OTHER): Payer: Medicare Other | Admitting: Adult Health

## 2018-10-14 ENCOUNTER — Encounter: Payer: Self-pay | Admitting: Adult Health

## 2018-10-14 ENCOUNTER — Other Ambulatory Visit: Payer: Self-pay

## 2018-10-14 VITALS — BP 133/80 | Temp 97.6°F | Ht 64.5 in | Wt 112.0 lb

## 2018-10-14 DIAGNOSIS — Z0000 Encounter for general adult medical examination without abnormal findings: Secondary | ICD-10-CM

## 2018-10-14 DIAGNOSIS — C9201 Acute myeloblastic leukemia, in remission: Secondary | ICD-10-CM

## 2018-10-14 DIAGNOSIS — E559 Vitamin D deficiency, unspecified: Secondary | ICD-10-CM | POA: Diagnosis not present

## 2018-10-14 DIAGNOSIS — E785 Hyperlipidemia, unspecified: Secondary | ICD-10-CM | POA: Diagnosis not present

## 2018-10-14 DIAGNOSIS — E278 Other specified disorders of adrenal gland: Secondary | ICD-10-CM | POA: Diagnosis not present

## 2018-10-14 DIAGNOSIS — F17200 Nicotine dependence, unspecified, uncomplicated: Secondary | ICD-10-CM

## 2018-10-14 LAB — COMPREHENSIVE METABOLIC PANEL
ALT: 26 U/L (ref 0–35)
AST: 18 U/L (ref 0–37)
Albumin: 4 g/dL (ref 3.5–5.2)
Alkaline Phosphatase: 55 U/L (ref 39–117)
BUN: 17 mg/dL (ref 6–23)
CO2: 32 mEq/L (ref 19–32)
Calcium: 9.1 mg/dL (ref 8.4–10.5)
Chloride: 102 mEq/L (ref 96–112)
Creatinine, Ser: 0.81 mg/dL (ref 0.40–1.20)
GFR: 70.15 mL/min (ref >60.00)
Glucose, Bld: 72 mg/dL (ref 70–99)
Potassium: 3.7 mEq/L (ref 3.5–5.1)
Sodium: 141 mEq/L (ref 135–145)
Total Bilirubin: 0.6 mg/dL (ref 0.2–1.2)
Total Protein: 6.3 g/dL (ref 6.0–8.3)

## 2018-10-14 LAB — LIPID PANEL
Cholesterol: 156 mg/dL (ref 0–200)
HDL: 57.6 mg/dL (ref >39.00)
LDL Cholesterol: 61 mg/dL (ref 0–99)
NonHDL: 98.69
Total CHOL/HDL Ratio: 3
Triglycerides: 188 mg/dL — ABNORMAL HIGH (ref 0.0–149.0)
VLDL: 37.6 mg/dL (ref 0.0–40.0)

## 2018-10-14 LAB — VITAMIN D 25 HYDROXY (VIT D DEFICIENCY, FRACTURES): VITD: 31.29 ng/mL (ref 30.00–100.00)

## 2018-10-14 LAB — CBC WITH DIFFERENTIAL/PLATELET
Basophils Absolute: 0 10*3/uL (ref 0.0–0.1)
Basophils Relative: 0.4 % (ref 0.0–3.0)
Eosinophils Absolute: 0.1 10*3/uL (ref 0.0–0.7)
Eosinophils Relative: 0.7 % (ref 0.0–5.0)
HCT: 41.8 % (ref 36.0–46.0)
Hemoglobin: 14.3 g/dL (ref 12.0–15.0)
Lymphocytes Relative: 38.5 % (ref 12.0–46.0)
Lymphs Abs: 3.8 10*3/uL (ref 0.7–4.0)
MCHC: 34.1 g/dL (ref 30.0–36.0)
MCV: 89.1 fl (ref 78.0–100.0)
Monocytes Absolute: 0.9 10*3/uL (ref 0.1–1.0)
Monocytes Relative: 8.7 % (ref 3.0–12.0)
Neutro Abs: 5.1 10*3/uL (ref 1.4–7.7)
Neutrophils Relative %: 51.7 % (ref 43.0–77.0)
Platelets: 354 10*3/uL (ref 150.0–400.0)
RBC: 4.69 Mil/uL (ref 3.87–5.11)
RDW: 14.2 % (ref 11.5–15.5)
WBC: 9.9 10*3/uL (ref 4.0–10.5)

## 2018-10-14 LAB — TSH: TSH: 6.95 u[IU]/mL — ABNORMAL HIGH (ref 0.35–4.50)

## 2018-10-14 NOTE — Progress Notes (Signed)
Subjective:    Patient ID: Emma Schneider, female    DOB: 10/20/1950, 67 y.o.   MRN: [id number]  HPI Patient presents for yearly preventative medicine examination. 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  She is followed by [facility] hematology/oncology on a yearly basis.  Denies fevers, chills, anorexia, weight loss, nausea, vomiting, or diarrhea   Right adrenal adenoma - is followed by [facility] Endocrinology.   Vitamin D deficiency-takes 3000 units daily  BP Readings from Last 3 Encounters:  10/14/18 133/80  10/07/17 138/70  10/02/16 120/74    Tobacco Use - she is smoking less than a half a pack a day. She had quit but when COVID pandemic started she started smoking again. She is working on cutting back.  She has been working on cutting back.   She has yearly lung cancer screening due to smoking history as well as having all pulmonary nodules.  Her last low-dose CT was in February 2020 at which time it was recommended yearly follow-up as nodules were thought to be benign  All immunizations and health maintenance protocols were reviewed with the patient and needed orders were placed. UTD  Appropriate screening laboratory values were ordered for the patient including screening of hyperlipidemia, renal function and hepatic function.  Medication reconciliation,  past medical history, social history, problem list and allergies were reviewed in  detail with the patient  Goals were established with regard to weight loss, exercise, and  diet in compliance with medications  End of life planning was discussed.  She is up-to-date on routine screening colonoscopy, mammogram, dental and vision screen   Review of Systems  Constitutional: Negative.   HENT: Negative.   Eyes: Negative.   Respiratory: Negative.   Cardiovascular: Negative.   Gastrointestinal: Negative.   Endocrine: Negative.   Genitourinary: Negative.   Musculoskeletal: Negative.   Skin: Negative.   Allergic/Immunologic: Negative.   Neurological: Negative.   Hematological: Negative.   Psychiatric/Behavioral: Negative.    Past Medical History:  Diagnosis Date  . 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 Aortic atherosclerosis ([place])   . Blood in urine   . Chicken pox   . Hyperlipidemia   . Leukemia ([place])   . Vitamin D deficiency     Social History   Socioeconomic History  . Marital status: Married    Spouse name: Not on file  . Number of children: Not on file  . Years of education: Not on file  . Highest education level: Not on file  Occupational History  . Not on file  Social Needs  . Financial resource strain: Not on file  . Food insecurity    Worry: Not on file    Inability: Not on file  . Transportation needs    Medical: Not on file    Non-medical: Not on file  Tobacco Use  . Smoking status: Current Every Day Smoker    Packs/day: 1.00    Years: 42.00    Pack years: 42.00  Types: Cigarettes  . Smokeless tobacco: Never Used  . Tobacco comment: Is actively trying to cut down number of cigarettes, but not ready to set a quit date.  Substance and Sexual Activity  . Alcohol use: No    Alcohol/week: 0.0 standard drinks  . Drug use: No  . Sexual activity: Not on file  Lifestyle  . Physical activity    Days per week: Not on file    Minutes per session: Not on file  . Stress: Not on file  Relationships  . Social Herbalist on phone: Not  on file    Gets together: Not on file    Attends religious service: Not on file    Active member of club or organization: Not on file    Attends meetings of clubs or organizations: Not on file    Relationship status: Not on file  . Intimate partner violence    Fear of current or ex partner: Not on file    Emotionally abused: Not on file    Physically abused: Not on file    Forced sexual activity: Not on file  Other Topics Concern  . 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    -Married for 30 years    - Has step children, all live in [place]   - No pets   - Plays games on the computer and enjoys yard work.     History reviewed. No pertinent surgical history.  Family History  Problem Relation Age of Onset  . Congestive Heart Failure Mother   . Heart attack Father   . Heart disease Father     No Known Allergies  Current Outpatient Medications on File Prior to Visit  Medication Sig Dispense Refill  . atorvastatin (LIPITOR) 10 MG tablet Take 1 tablet (10 mg total) by mouth daily. 90 tablet 3  . calcium carbonate (OS-CAL) 600 MG TABS tablet Take 1,200 mg by mouth daily.    . Cholecalciferol 3000 units TABS Take 3,000 Units by mouth daily. 90 tablet 3  . ferrous sulfate 324 MG TBEC Take 324 mg by mouth.    . predniSONE (DELTASONE) 10 MG tablet Takes two tabs ( 20 mg) x 7 days then 1 tab ( 10 mg) x 7 days 21 tablet 0  . risedronate (ACTONEL) 35 MG tablet Take 35 mg by mouth every 7 (seven) days.      No current facility-administered medications on file prior to visit.     BP 133/80 Comment: BOTH ARMS  Temp 97.6 F (36.4 C)   Ht 5' 4.5" (1.638 m) Comment: WITH SHOES  Wt 112 lb (50.8 kg)   BMI 18.93 kg/m       Objective:   Physical Exam Vitals signs and nursing note reviewed.  Constitutional:      General: She is not in acute distress.    Appearance: Normal appearance. She is well-developed and normal weight.  HENT:      Head: Normocephalic and atraumatic.     Right Ear: Tympanic membrane, ear canal and external ear normal. There is no impacted cerumen.     Left Ear: Tympanic membrane and external ear normal. There is no impacted cerumen.     Nose: Nose normal. No congestion or rhinorrhea.     Mouth/Throat:     Mouth: Mucous membranes are moist.     Pharynx: Oropharynx is clear. No oropharyngeal exudate or posterior oropharyngeal  erythema.  Eyes:     General:        Right eye: No discharge.        Left eye: No discharge.     Conjunctiva/sclera: Conjunctivae normal.  Neck:     Musculoskeletal: Normal range of motion and neck supple.     Thyroid: No thyromegaly.     Trachea: No tracheal deviation.  Cardiovascular:     Rate and Rhythm: Normal rate and regular rhythm.     Heart sounds: Normal heart sounds. No murmur. No friction rub. No gallop.   Pulmonary:     Effort: Pulmonary effort is normal. No respiratory distress.     Breath sounds: Normal breath sounds. No wheezing or rales.  Chest:     Chest wall: No tenderness.  Abdominal:     General: Bowel sounds are normal. There is no distension.     Palpations: Abdomen is soft. There is no mass.     Tenderness: There is no abdominal tenderness. There is no right CVA tenderness, left CVA tenderness, guarding or rebound.     Hernia: No hernia is present.  Musculoskeletal: Normal range of motion.        General: No swelling, tenderness, deformity or signs of injury.     Right lower leg: No edema.     Left lower leg: No edema.  Lymphadenopathy:     Cervical: No cervical adenopathy.  Skin:    General: Skin is warm and dry.     Coloration: Skin is not jaundiced or pale.     Findings: No bruising, erythema, lesion or rash.  Neurological:     Mental Status: She is alert and oriented to person, place, and time.     Cranial Nerves: No cranial nerve deficit.     Coordination: Coordination normal.  Psychiatric:        Mood and Affect: Mood normal.         Behavior: Behavior normal.        Thought Content: Thought content normal.        Judgment: Judgment normal.       Assessment & Plan:  1. Routine general medical examination at a health care facility - Needs to quit smoking  - Heart healthy diet and exercise  - Follow up in one year or sooner if needed - CBC with Differential/Platelet - Comprehensive metabolic panel - Lipid panel - TSH  2. Tobacco use disorder - Encouraged to quit smoking  3. 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 Vitamin D deficiency  - Vitamin D, 25-hydroxy  5. Hyperlipidemia, unspecified hyperlipidemia type - Consider increasing statin  - CBC with Differential/Platelet - Comprehensive metabolic panel - Lipid panel - TSH  6. Acute myeloid leukemia in remission [facility]) - Follow up with [place] [facility] as directed   Dorothyann Peng, NP

## 2018-10-16 ENCOUNTER — Other Ambulatory Visit: Payer: Self-pay | Admitting: Family Medicine

## 2018-10-16 DIAGNOSIS — R7989 Other specified abnormal findings of blood chemistry: Secondary | ICD-10-CM

## 2018-11-02 ENCOUNTER — Ambulatory Visit
Admission: RE | Admit: 2018-11-02 | Discharge: 2018-11-02 | Disposition: A | Discharge status: Home / Self Care | Payer: Medicare Other | Source: Ambulatory Visit | Attending: Adult Health | Admitting: Adult Health

## 2018-11-02 DIAGNOSIS — R7989 Other specified abnormal findings of blood chemistry: Secondary | ICD-10-CM

## 2018-11-04 ENCOUNTER — Other Ambulatory Visit: Payer: Self-pay | Admitting: Adult Health

## 2018-11-04 DIAGNOSIS — R7989 Other specified abnormal findings of blood chemistry: Secondary | ICD-10-CM

## 2018-11-05 ENCOUNTER — Other Ambulatory Visit: Payer: Self-pay | Admitting: Adult Health

## 2018-11-05 NOTE — Telephone Encounter (Signed)
Sent to the pharmacy by e-scribe. 

## 2018-12-07 ENCOUNTER — Other Ambulatory Visit (INDEPENDENT_AMBULATORY_CARE_PROVIDER_SITE_OTHER): Payer: Medicare Other

## 2018-12-07 ENCOUNTER — Other Ambulatory Visit: Payer: Self-pay

## 2018-12-07 DIAGNOSIS — R7989 Other specified abnormal findings of blood chemistry: Secondary | ICD-10-CM | POA: Diagnosis not present

## 2018-12-07 LAB — TSH: TSH: 4.55 u[IU]/mL — ABNORMAL HIGH (ref 0.35–4.50)

## 2018-12-07 LAB — T3, FREE: T3, Free: 3.1 pg/mL (ref 2.3–4.2)

## 2018-12-07 LAB — T4, FREE: Free T4: 0.89 ng/dL (ref 0.60–1.60)

## 2018-12-08 ENCOUNTER — Other Ambulatory Visit: Payer: Self-pay | Admitting: Adult Health

## 2018-12-15 ENCOUNTER — Other Ambulatory Visit: Payer: Self-pay | Admitting: Adult Health

## 2018-12-15 DIAGNOSIS — E039 Hypothyroidism, unspecified: Secondary | ICD-10-CM

## 2018-12-15 MED ORDER — LEVOTHYROXINE SODIUM 25 MCG PO TABS: 25.0000 ug | ORAL_TABLET | Freq: Every day | ORAL | 1 refills | Status: DC

## 2018-12-16 ENCOUNTER — Encounter: Payer: Self-pay | Admitting: Family Medicine

## 2018-12-16 NOTE — Telephone Encounter (Signed)
Spoke to the pt and informed her of test results.  She states she is taking OTC every other day.  Is willing to try everyday is ok with Tommi Rumps.  Explained that she may experience stomach discomfort and constipation.  Also has a new Medicare plan and her new prescription plan will cover Robaxin.  Would like to switch back.  Please advise.

## 2018-12-17 NOTE — Telephone Encounter (Signed)
I don't have her ever being on Robaxin

## 2018-12-17 NOTE — Telephone Encounter (Signed)
Documentation below was not meant for this chart.

## 2019-01-14 ENCOUNTER — Telehealth (INDEPENDENT_AMBULATORY_CARE_PROVIDER_SITE_OTHER): Payer: Medicare Other | Admitting: Adult Health

## 2019-01-14 ENCOUNTER — Other Ambulatory Visit: Payer: Self-pay

## 2019-01-14 DIAGNOSIS — E039 Hypothyroidism, unspecified: Secondary | ICD-10-CM | POA: Diagnosis not present

## 2019-01-14 NOTE — Progress Notes (Signed)
Virtual Visit via Video Note  I connected with Emma Schneider on 01/14/19 at 10:30 AM EDT by a video enabled telemedicine application and verified that I am speaking with the correct person using two identifiers.  Location patient: home Location provider:work or home office Persons participating in the virtual visit: patient, provider  I discussed the limitations of evaluation and management by telemedicine and the availability of in person appointments. The patient expressed understanding and agreed to proceed.   HPI:  68-year-old female who is being evaluated today for follow-up regarding hypothyroidism.  During her physical exam her TSH was slightly elevated at 4.55 and previous to that her level was 6.95.  She was a symptomatic with thinning hair and fatigue.  We discussed starting Synthroid on a trial.  To see if this improved.  She has been taking Synthroid 25 mcg for the last month.  She reports that her hair is no longer falling out in clumps like it used to and she seems to have more energy.  She is happy with the results.   ROS: See pertinent positives and negatives per HPI.  Past Medical History:  Diagnosis Date  . 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 Aortic atherosclerosis ([place])   . Blood in urine   . Chicken pox   . Hyperlipidemia   . Leukemia ([place])   . Vitamin D deficiency     No past surgical history on file.  Family History  Problem Relation Age of Onset  . Congestive Heart Failure Mother   . Heart attack Father   . Heart disease Father      Current Outpatient Medications:  .  atorvastatin (LIPITOR) 10 MG tablet, Take 1 tablet by mouth once daily, Disp: 90 tablet, Rfl: 3 .  calcium carbonate (OS-CAL) 600 MG TABS tablet, Take 1,200 mg by mouth daily., Disp: , Rfl:  .  Cholecalciferol 3000 units TABS, Take 3,000 Units by mouth daily., Disp: 90 tablet, Rfl: 3 .  ferrous sulfate 324 MG TBEC, Take 324 mg by mouth., Disp: , Rfl:  .  levothyroxine (SYNTHROID) 25 MCG  tablet, Take 1 tablet (25 mcg total) by mouth daily before breakfast., Disp: 30 tablet, Rfl: 1 .  risedronate (ACTONEL) 35 MG tablet, Take 35 mg by mouth every 7 (seven) days. , Disp: , Rfl:   EXAM:  VITALS per patient if applicable:  GENERAL: alert, oriented, appears well and in no acute distress  HEENT: atraumatic, conjunttiva clear, no obvious abnormalities on inspection of external nose and ears  NECK: normal movements of the head and neck  LUNGS: on inspection no signs of respiratory distress, breathing rate appears normal, no obvious gross SOB, gasping or wheezing  CV: no obvious cyanosis  MS: moves all visible extremities without noticeable abnormality  PSYCH/NEURO: pleasant and cooperative, no obvious depression or anxiety, speech and thought processing grossly intact  ASSESSMENT AND PLAN:  Discussed the following assessment and plan:  1. Hypothyroidism, unspecified type -We will have her follow-up for repeat TSH.    I discussed the assessment and treatment plan with the patient. The patient was provided an opportunity to ask questions and all were answered. The patient agreed with the plan and demonstrated an understanding of the instructions.   The patient was advised to call back or seek an in-person evaluation if the symptoms worsen or if the condition fails to improve as anticipated.   Dorothyann Peng, NP

## 2019-01-19 ENCOUNTER — Other Ambulatory Visit: Payer: Self-pay

## 2019-01-19 ENCOUNTER — Other Ambulatory Visit (INDEPENDENT_AMBULATORY_CARE_PROVIDER_SITE_OTHER): Payer: Medicare Other

## 2019-01-19 ENCOUNTER — Ambulatory Visit: Payer: Medicare Other

## 2019-01-19 DIAGNOSIS — Z23 Encounter for immunization: Secondary | ICD-10-CM

## 2019-01-19 DIAGNOSIS — E039 Hypothyroidism, unspecified: Secondary | ICD-10-CM | POA: Diagnosis not present

## 2019-01-19 LAB — TSH: TSH: 1.74 u[IU]/mL (ref 0.35–4.50)

## 2019-01-20 ENCOUNTER — Other Ambulatory Visit: Payer: Self-pay | Admitting: Family Medicine

## 2019-01-20 DIAGNOSIS — E039 Hypothyroidism, unspecified: Secondary | ICD-10-CM

## 2019-01-20 MED ORDER — LEVOTHYROXINE SODIUM 25 MCG PO TABS: 25.0000 ug | ORAL_TABLET | Freq: Every day | ORAL | 3 refills | Status: DC

## 2019-05-11 DIAGNOSIS — E079 Disorder of thyroid, unspecified: Secondary | ICD-10-CM

## 2019-05-11 HISTORY — DX: Disorder of thyroid, unspecified: E07.9

## 2019-06-03 ENCOUNTER — Ambulatory Visit (INDEPENDENT_AMBULATORY_CARE_PROVIDER_SITE_OTHER)
Admission: RE | Admit: 2019-06-03 | Discharge: 2019-06-03 | Disposition: A | Discharge status: Home / Self Care | Payer: Medicare PPO | Source: Ambulatory Visit | Attending: Acute Care | Admitting: Acute Care

## 2019-06-03 ENCOUNTER — Other Ambulatory Visit: Payer: Self-pay

## 2019-06-03 DIAGNOSIS — F17210 Nicotine dependence, cigarettes, uncomplicated: Secondary | ICD-10-CM

## 2019-06-03 DIAGNOSIS — Z87891 Personal history of nicotine dependence: Secondary | ICD-10-CM | POA: Diagnosis not present

## 2019-06-03 DIAGNOSIS — Z122 Encounter for screening for malignant neoplasm of respiratory organs: Secondary | ICD-10-CM

## 2019-06-04 NOTE — Progress Notes (Signed)
Please call patient and let them  know their  low dose Ct was read as a Lung RADS 2: nodules that are benign in appearance and behavior with a very low likelihood of becoming a clinically active cancer due to size or lack of growth. Recommendation per radiology is for a repeat LDCT in 12 months. Please let them  know we will order and schedule their  annual screening scan for 05/2020. Please let them  know there was notation of CAD on their  scan.  Please remind the patient  that this is a non-gated exam therefore degree or severity of disease  cannot be determined. Please have them  follow up with their PCP regarding potential risk factor modification, dietary therapy or pharmacologic therapy if clinically indicated. Pt.  is  currently on statin therapy. Please place order for annual  screening scan for  05/2020 and fax results to PCP. Thanks so much. 

## 2019-06-07 ENCOUNTER — Other Ambulatory Visit: Payer: Self-pay | Admitting: *Deleted

## 2019-06-07 DIAGNOSIS — F17210 Nicotine dependence, cigarettes, uncomplicated: Secondary | ICD-10-CM

## 2019-06-07 DIAGNOSIS — Z87891 Personal history of nicotine dependence: Secondary | ICD-10-CM

## 2019-11-29 ENCOUNTER — Other Ambulatory Visit: Payer: Self-pay | Admitting: Adult Health

## 2019-11-29 NOTE — Telephone Encounter (Signed)
atorvastatin (LIPITOR) 10 MG tablet  [place], [place] [address] Phone:  [phone]  Fax:  [phone]

## 2019-11-30 NOTE — Telephone Encounter (Signed)
This patient is past due for her cpx.  Please schedule.  Will then fill. Please send message back when scheduled.

## 2019-12-03 MED ORDER — ATORVASTATIN CALCIUM 10 MG PO TABS: 10.0000 mg | ORAL_TABLET | Freq: Every day | ORAL | 0 refills | Status: DC

## 2019-12-03 NOTE — Telephone Encounter (Signed)
Pt is scheduled for CPE October 14th. Pt states she is out of med

## 2019-12-03 NOTE — Addendum Note (Signed)
Addended by: Miles Costain T on: 12/03/2019 10:16 AM   Modules accepted: Orders

## 2019-12-03 NOTE — Telephone Encounter (Signed)
SENT TO THE PHARMACY BY E-SCRIBE. 

## 2020-01-13 ENCOUNTER — Other Ambulatory Visit: Payer: Self-pay

## 2020-01-13 ENCOUNTER — Ambulatory Visit (INDEPENDENT_AMBULATORY_CARE_PROVIDER_SITE_OTHER): Payer: Medicare PPO

## 2020-01-13 DIAGNOSIS — Z23 Encounter for immunization: Secondary | ICD-10-CM | POA: Diagnosis not present

## 2020-02-09 NOTE — Progress Notes (Signed)
Subjective:    Patient ID: Emma Schneider, female    DOB: 1950/08/13, 69 y.o.   MRN: [id number]  HPI Patient presents for yearly preventative medicine examination. 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  Hyperlipidemia edema/aortic atherosclerosis-is currently prescribed Lipitor 10 mg daily.  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  She was  followed by [facility] hematology/oncology on a yearly basis.  Her last visit with [facility] was in September 2020 at which time they thought that she could be managed by her PCP with yearly CBCs as the risk for relapse at this time was incredibly low. She Denies fevers, chills anorexia, weight loss, nausea, vomiting, or diarrhea.  She denies any recent changes since her last follow-up at [facility].  Right adrenal adenoma-followed by [place] endocrinology  Vitamin D Deficiency - takes vitamin D 3000 units daily.   Hypothyroidism -Currently prescribed Synthroid 25 mcg.  Prior to starting Synthroid she was reporting that her hair was falling out and he was fatigued.  Since starting Synthroid her symptoms have resolved  Lab Results  Component Value Date   TSH 1.74 01/19/2019   Tobacco Use - continues to smoke less then half pack a day. She reports that she has been cutting back on cigarettes but not ready to quit completley yet. Denis SOB or CP  All immunizations and health maintenance protocols were reviewed with the patient and needed orders were placed.  Appropriate screening laboratory values were ordered for the patient including screening of hyperlipidemia, renal function and hepatic  function.   Medication reconciliation,  past medical history, social history, problem list and allergies were reviewed in detail with the patient  Goals were established with regard to weight loss, exercise, and  diet in compliance with medications Wt Readings from Last 3 Encounters:  02/10/20 109 lb 9.6 oz (49.7 kg)  10/14/18 112 lb (50.8 kg)  10/07/17 117 lb (53.1 kg)   She is getting her colonoscopy done in late next week. She is up to date on routine GYN care.    Review of Systems  Constitutional: Negative.   HENT: Negative.   Eyes: Negative.   Respiratory: Negative.   Cardiovascular: Negative.   Gastrointestinal: Negative.   Endocrine: Negative.   Genitourinary: Negative.   Musculoskeletal: Negative.   Skin: Negative.   Allergic/Immunologic: Negative.   Neurological: Negative.   Hematological: Negative.   Psychiatric/Behavioral: Negative.    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[place])    Aortic atherosclerosis ([place])    Blood in urine    Chicken pox    Hyperlipidemia    Leukemia ([place])    Vitamin D deficiency     Social History   Socioeconomic History   Marital status: Married    Spouse name: Not on file   Number of children: Not on file   Years of education: Not on file   Highest education level: Not on file  Occupational History   Not on file  Tobacco Use   Smoking status: Current Every Day Smoker    Packs/day: 1.00    Years: 42.00    Pack years: 42.00    Types: Cigarettes  Smokeless tobacco: Never Used   Tobacco comment: Is actively trying to cut down number of cigarettes, but not ready to set a quit date.  Substance and Sexual Activity   Alcohol use: No    Alcohol/week: 0.0 standard drinks   Drug use: No   Sexual activity: Not on file  Other Topics Concern   Not on file  Social History Narrative   -She works at a Pharmacist, community office as an Environmental consultant.    -Married for 30 years    - Has step children, all  live in [place]   - No pets   - Plays games on the computer and enjoys yard work.    Social Determinants of Health   Financial Resource Strain:    Difficulty of Paying Living Expenses: Not on file  Food Insecurity:    Worried About Charity fundraiser in the Last Year: Not on file   [employer] of Food in the Last Year: Not on file  Transportation Needs:    Lack of Transportation (Medical): Not on file   Lack of Transportation (Non-Medical): Not on file  Physical Activity:    Days of Exercise per Week: Not on file   Minutes of Exercise per Session: Not on file  Stress:    Feeling of Stress : Not on file  Social Connections:    Frequency of Communication with Friends and Family: Not on file   Frequency of Social Gatherings with Friends and Family: Not on file   Attends Religious Services: Not on file   Active Member of Clubs or Organizations: Not on file   Attends Archivist Meetings: Not on file   Marital Status: Not on file  Intimate Partner Violence:    Fear of Current or Ex-Partner: Not on file   Emotionally Abused: Not on file   Physically Abused: Not on file   Sexually Abused: Not on file    No past surgical history on file.  Family History  Problem Relation Age of Onset   Congestive Heart Failure Mother    Heart attack Father    Heart disease Father     No Known Allergies  Current Outpatient Medications on File Prior to Visit  Medication Sig Dispense Refill   atorvastatin (LIPITOR) 10 MG tablet Take 1 tablet (10 mg total) by mouth daily. 90 tablet 0   calcium carbonate (OS-CAL) 600 MG TABS tablet Take 1,200 mg by mouth daily.     Cholecalciferol 3000 units TABS Take 3,000 Units by mouth daily. 90 tablet 3   ferrous sulfate 324 MG TBEC Take 324 mg by mouth.     levothyroxine (SYNTHROID) 25 MCG tablet Take 1 tablet (25 mcg total) by mouth daily before breakfast. 90 tablet 3   risedronate (ACTONEL) 35 MG tablet Take 35 mg by mouth  every 7 (seven) days.      No current facility-administered medications on file prior to visit.    There were no vitals taken for this visit.      Objective:   Physical Exam Vitals and nursing note reviewed.  Constitutional:      General: She is not in acute distress.    Appearance: Normal appearance. She is well-developed. She is not ill-appearing.  HENT:     Head: Normocephalic and atraumatic.     Right Ear: Tympanic membrane, ear canal and external ear normal. There is no impacted cerumen.     Left Ear: Tympanic membrane, ear canal and external ear normal. There is  no impacted cerumen.     Nose: Nose normal. No congestion or rhinorrhea.     Mouth/Throat:     Mouth: Mucous membranes are moist.     Pharynx: Oropharynx is clear. No oropharyngeal exudate or posterior oropharyngeal erythema.  Eyes:     General:        Right eye: No discharge.        Left eye: No discharge.     Extraocular Movements: Extraocular movements intact.     Conjunctiva/sclera: Conjunctivae normal.     Pupils: Pupils are equal, round, and reactive to light.  Neck:     Thyroid: No thyromegaly.     Vascular: No carotid bruit.     Trachea: No tracheal deviation.  Cardiovascular:     Rate and Rhythm: Normal rate and regular rhythm.     Pulses: Normal pulses.     Heart sounds: Normal heart sounds. No murmur heard.  No friction rub. No gallop.   Pulmonary:     Effort: Pulmonary effort is normal. No respiratory distress.     Breath sounds: Normal breath sounds. No stridor. No wheezing, rhonchi or rales.  Chest:     Chest wall: No tenderness.  Abdominal:     General: Abdomen is flat. Bowel sounds are normal. There is no distension.     Palpations: Abdomen is soft. There is no mass.     Tenderness: There is no abdominal tenderness. There is no right CVA tenderness, left CVA tenderness, guarding or rebound.     Hernia: No hernia is present.  Musculoskeletal:        General: No swelling, tenderness,  deformity or signs of injury. Normal range of motion.     Cervical back: Normal range of motion and neck supple.     Right lower leg: No edema.     Left lower leg: No edema.  Lymphadenopathy:     Cervical: No cervical adenopathy.  Skin:    General: Skin is warm and dry.     Coloration: Skin is not jaundiced or pale.     Findings: No bruising, erythema, lesion or rash.  Neurological:     General: No focal deficit present.     Mental Status: She is alert and oriented to person, place, and time.     Cranial Nerves: No cranial nerve deficit.     Sensory: No sensory deficit.     Motor: No weakness.     Coordination: Coordination normal.     Gait: Gait normal.     Deep Tendon Reflexes: Reflexes normal.  Psychiatric:        Mood and Affect: Mood normal.        Behavior: Behavior normal.        Thought Content: Thought content normal.        Judgment: Judgment normal.       Assessment & Plan:  1. Routine general medical examination at a health care facility - Follow up in one year or sooner if needed - Encouraged to quit smoking  - Work on diet and exercise  - CBC with Differential/Platelet; Future - Hemoglobin A1c; Future - Lipid panel; Future - TSH; Future - CMP with eGFR([facility]); Future  2. Hypothyroidism, unspecified type - Consider dose change in synthroid  - CBC with Differential/Platelet; Future - Hemoglobin A1c; Future - Lipid panel; Future - TSH; Future - CMP with eGFR([facility]); Future  3. 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 Vitamin D deficiency  - VITAMIN D 25 Hydroxy (Vit-D Deficiency, Fractures); Future  5. Hyperlipidemia, unspecified hyperlipidemia type - Consider increase in statin  - CBC with Differential/Platelet; Future - Hemoglobin A1c; Future - Lipid panel; Future - TSH; Future - CMP with eGFR([facility]); Future  6.  Acute myeloid leukemia in remission ([place])  - CBC with Differential/Platelet; Future - Hemoglobin A1c; Future - Lipid panel; Future - TSH; Future - CMP with eGFR([facility]); Future  7. Tobacco use disorder - Encouraged to quit smoking.  - Had CT lung cancer screen done in Feb 2021   Dorothyann Peng, NP

## 2020-02-10 ENCOUNTER — Ambulatory Visit (INDEPENDENT_AMBULATORY_CARE_PROVIDER_SITE_OTHER): Payer: Medicare PPO | Admitting: Adult Health

## 2020-02-10 ENCOUNTER — Other Ambulatory Visit: Payer: Self-pay

## 2020-02-10 ENCOUNTER — Encounter: Payer: Self-pay | Admitting: Adult Health

## 2020-02-10 VITALS — BP 128/80 | HR 119 | Temp 97.7°F | Ht 63.75 in | Wt 109.6 lb

## 2020-02-10 DIAGNOSIS — Z0000 Encounter for general adult medical examination without abnormal findings: Secondary | ICD-10-CM

## 2020-02-10 DIAGNOSIS — E039 Hypothyroidism, unspecified: Secondary | ICD-10-CM | POA: Diagnosis not present

## 2020-02-10 DIAGNOSIS — E278 Other specified disorders of adrenal gland: Secondary | ICD-10-CM

## 2020-02-10 DIAGNOSIS — E559 Vitamin D deficiency, unspecified: Secondary | ICD-10-CM

## 2020-02-10 DIAGNOSIS — E785 Hyperlipidemia, unspecified: Secondary | ICD-10-CM

## 2020-02-10 DIAGNOSIS — F17200 Nicotine dependence, unspecified, uncomplicated: Secondary | ICD-10-CM

## 2020-02-10 DIAGNOSIS — C9201 Acute myeloblastic leukemia, in remission: Secondary | ICD-10-CM

## 2020-02-11 ENCOUNTER — Other Ambulatory Visit: Payer: Self-pay | Admitting: Adult Health

## 2020-02-11 DIAGNOSIS — E039 Hypothyroidism, unspecified: Secondary | ICD-10-CM

## 2020-02-11 LAB — COMPLETE METABOLIC PANEL WITH GFR
AG Ratio: 1.5 (calc) (ref 1.0–2.5)
ALT: 8 U/L (ref 6–29)
AST: 14 U/L (ref 10–35)
Albumin: 4.2 g/dL (ref 3.6–5.1)
Alkaline phosphatase (APISO): 76 U/L (ref 37–153)
BUN/Creatinine Ratio: 14 (calc) (ref 6–22)
BUN: 14 mg/dL (ref 7–25)
CO2: 27 mmol/L (ref 20–32)
Calcium: 9.8 mg/dL (ref 8.6–10.4)
Chloride: 106 mmol/L (ref 98–110)
Creat: 1.01 mg/dL — ABNORMAL HIGH (ref 0.60–0.93)
GFR, Est African American: 65 mL/min/{1.73_m2} (ref >=60)
GFR, Est Non African American: 56 mL/min/{1.73_m2} — ABNORMAL LOW (ref >=60)
Globulin: 2.8 g/dL (calc) (ref 1.9–3.7)
Glucose, Bld: 103 mg/dL — ABNORMAL HIGH (ref 65–99)
Potassium: 3.5 mmol/L (ref 3.5–5.3)
Sodium: 142 mmol/L (ref 135–146)
Total Bilirubin: 0.5 mg/dL (ref 0.2–1.2)
Total Protein: 7 g/dL (ref 6.1–8.1)

## 2020-02-11 LAB — CBC WITH DIFFERENTIAL/PLATELET
Absolute Monocytes: 570 cells/uL (ref 200–950)
Basophils Absolute: 60 cells/uL (ref 0–200)
Basophils Relative: 0.8 %
Eosinophils Absolute: 53 cells/uL (ref 15–500)
Eosinophils Relative: 0.7 %
HCT: 44.5 % (ref 35.0–45.0)
Hemoglobin: 15.1 g/dL (ref 11.7–15.5)
Lymphs Abs: 2048 cells/uL (ref 850–3900)
MCH: 30.6 pg (ref 27.0–33.0)
MCHC: 33.9 g/dL (ref 32.0–36.0)
MCV: 90.1 fL (ref 80.0–100.0)
MPV: 9.1 fL (ref 7.5–12.5)
Monocytes Relative: 7.6 %
Neutro Abs: 4770 cells/uL (ref 1500–7800)
Neutrophils Relative %: 63.6 %
Platelets: 334 10*3/uL (ref 140–400)
RBC: 4.94 10*6/uL (ref 3.80–5.10)
RDW: 12.2 % (ref 11.0–15.0)
Total Lymphocyte: 27.3 %
WBC: 7.5 10*3/uL (ref 3.8–10.8)

## 2020-02-11 LAB — VITAMIN D 25 HYDROXY (VIT D DEFICIENCY, FRACTURES): Vit D, 25-Hydroxy: 35 ng/mL (ref 30–100)

## 2020-02-11 LAB — HEMOGLOBIN A1C
Hgb A1c MFr Bld: 5.5 % of total Hgb (ref <5.7)
Mean Plasma Glucose: 111 (calc)
eAG (mmol/L): 6.2 (calc)

## 2020-02-11 LAB — LIPID PANEL
Cholesterol: 148 mg/dL (ref <200)
HDL: 53 mg/dL (ref >=50)
LDL Cholesterol (Calc): 76 mg/dL (calc)
Non-HDL Cholesterol (Calc): 95 mg/dL (calc) (ref <130)
Total CHOL/HDL Ratio: 2.8 (calc) (ref <5.0)
Triglycerides: 102 mg/dL (ref <150)

## 2020-02-11 LAB — TSH: TSH: 2.35 mIU/L (ref 0.40–4.50)

## 2020-02-11 MED ORDER — LEVOTHYROXINE SODIUM 25 MCG PO TABS: 25.0000 ug | ORAL_TABLET | Freq: Every day | ORAL | 3 refills | Status: DC

## 2020-02-14 ENCOUNTER — Other Ambulatory Visit: Payer: Self-pay | Admitting: Adult Health

## 2020-02-14 DIAGNOSIS — E039 Hypothyroidism, unspecified: Secondary | ICD-10-CM

## 2020-03-28 ENCOUNTER — Other Ambulatory Visit: Payer: Self-pay | Admitting: Adult Health

## 2020-04-18 ENCOUNTER — Ambulatory Visit (INDEPENDENT_AMBULATORY_CARE_PROVIDER_SITE_OTHER): Payer: Medicare PPO

## 2020-04-18 ENCOUNTER — Other Ambulatory Visit: Payer: Self-pay

## 2020-04-18 VITALS — BP 118/64 | HR 64 | Temp 97.6°F | Resp 20 | Wt 106.4 lb

## 2020-04-18 DIAGNOSIS — Z0000 Encounter for general adult medical examination without abnormal findings: Secondary | ICD-10-CM | POA: Diagnosis not present

## 2020-04-18 NOTE — Progress Notes (Signed)
Subjective:   Emma Schneider is a 69 y.o. female who presents for an Initial Medicare Annual Wellness Visit.  Review of Systems     Cardiac Risk Factors include: advanced age (>32men, >89 women);smoking/ tobacco exposure     Objective:    Today's Vitals   04/18/20 0933  BP: 118/64  Pulse: 64  Resp: 20  Temp: 97.6 F (36.4 C)  SpO2: 100%  Weight: 106 lb 6.4 oz (48.3 kg)   Body mass index is 18.41 kg/m.  Advanced Directives 04/18/2020  Does Patient Have a Medical Advance Directive? Yes  Type of Advance Directive Living will;Healthcare Power of Emma Schneider in Chart? No - copy requested    Current Medications (verified) Outpatient Encounter Medications as of 04/18/2020  Medication Sig  . atorvastatin (LIPITOR) 10 MG tablet Take 1 tablet by mouth once daily  . calcium carbonate (OS-CAL) 600 MG TABS tablet Take 1,200 mg by mouth daily.  . Cholecalciferol 3000 units TABS Take 3,000 Units by mouth daily.  Arna Medici 25 MCG tablet TAKE 1 TABLET BY MOUTH ONCE DAILY BEFORE BREAKFAST  . ferrous sulfate 324 MG TBEC Take 324 mg by mouth.  . risedronate (ACTONEL) 35 MG tablet Take 35 mg by mouth every 7 (seven) days.   . [DISCONTINUED] Calcium Carb-Cholecalciferol 600-200 MG-UNIT TABS 1 tablet with food   No facility-administered encounter medications on file as of 04/18/2020.    Allergies (verified) Patient has no known allergies.   History: Past Medical History:  Diagnosis Date  . 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 Aortic atherosclerosis ([place])   . Blood in urine   . Chicken pox   . Hyperlipidemia   . Leukemia ([place])   . Vitamin D deficiency    History reviewed. No pertinent surgical history. Family History  Problem Relation Age of Onset  . Congestive Heart Failure Mother   . Heart attack Father   . Heart disease Father    Social History   Socioeconomic History  . Marital status: Married    Spouse name: Not on file  . Number  of children: Not on file  . Years of education: Not on file  . Highest education level: Not on file  Occupational History  . Occupation: retired  Tobacco Use  . Smoking status: Current Every Day Smoker    Packs/day: 1.00    Years: 42.00    Pack years: 42.00    Types: Cigarettes  . Smokeless tobacco: Never Used  . Tobacco comment: Is actively trying to cut down number of cigarettes, but not ready to set a quit date.  Substance and Sexual Activity  . Alcohol use: No    Alcohol/week: 0.0 standard drinks  . Drug use: No  . Sexual activity: Not on file  Other Topics Concern  . 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    -Married for 30 years    - Has step children, all live in [place]   - No pets   - Plays games on the computer and enjoys yard work.    Social Determinants of Health   Financial Resource Strain: Low Risk   . Difficulty of Paying Living Expenses: Not hard at all  Food Insecurity: No Food Insecurity  . Worried About Charity fundraiser in the Last Year: Never true  . Ran Out of Food in the Last Year: Never true  Transportation Needs:  No Transportation Needs  . Lack of Transportation (Medical): No  . Lack of Transportation (Non-Medical): No  Physical Activity: Inactive  . Days of Exercise per Week: 0 days  . Minutes of Exercise per Session: 0 min  Stress: No Stress Concern Present  . Feeling of Stress : Not at all  Social Connections: Moderately Integrated  . Frequency of Communication with Friends and Family: More than three times a week  . Frequency of Social Gatherings with Friends and Family: Twice a week  . Attends Religious Services: More than 4 times per year  . Active Member of Clubs or Organizations: No  . Attends Archivist Meetings: Never  . Marital Status: Married    Tobacco Counseling Ready to quit: Not Answered Counseling given: Not Answered Comment: Is actively trying to cut down number of  cigarettes, but not ready to set a quit date.   Clinical Intake:  Pre-visit preparation completed: Yes  Pain : No/denies pain     BMI - recorded: 18.41 Nutritional Status: BMI <19  Underweight Nutritional Risks: None Diabetes: No  How often do you need to have someone help you when you read instructions, pamphlets, or other written materials from your doctor or pharmacy?: 1 - Never  Diabetic?No  Interpreter Needed?: No  Information entered by :: Charlott Rakes, LPN   Activities of Daily Living In your present state of health, do you have any difficulty performing the following activities: 04/18/2020  Hearing? Y  Comment wears hearing aids  Vision? N  Difficulty concentrating or making decisions? N  Walking or climbing stairs? N  Dressing or bathing? N  Doing errands, shopping? N  Preparing Food and eating ? N  Using the Toilet? N  In the past six months, have you accidently leaked urine? N  Do you have problems with loss of bowel control? N  Managing your Medications? N  Managing your Finances? N  Housekeeping or managing your Housekeeping? N  Some recent data might be hidden    Patient Care Team: Dorothyann Peng, NP as PCP - General (Family Medicine) Irine Seal, MD as Attending Physician (Urology) Sabas Sous, MD as Referring Physician (Internal Medicine) Preminger, Adrienne Mocha, MD as Referring Physician (Urology)  Indicate any recent Medical Services you may have received from other than [facility] providers in the past year (date may be approximate).     Assessment:   This is a routine wellness examination for [name].  Hearing/Vision screen  Hearing Screening   125Hz  250Hz  500Hz  1000Hz  2000Hz  3000Hz  4000Hz  6000Hz  8000Hz   Right ear:           Left ear:           Comments: Hearing aids   Vision Screening Comments: Pt follows up with [facility] for annual eye exams  Dietary issues and exercise activities discussed: Current Exercise Habits: The  patient does not participate in regular exercise at present (regular house work)  Goals    . Increase physical activity    . Patient Stated     Maintain health    . Quit smoking / using tobacco      Depression Screen PHQ 2/9 Scores 04/18/2020 02/10/2020 10/02/2016 01/06/2015 09/20/2014  PHQ - 2 Score 0 0 0 0 0    Fall Risk Fall Risk  04/18/2020 02/10/2020 10/02/2016 01/06/2015 09/20/2014  Falls in the past year? 0 0 No No No  Number falls in past yr: 0 0 - - -  Injury with Fall? 0 - - - -  Risk for fall due to : Impaired vision - - - -  Follow up Falls prevention discussed - - - -    FALL RISK PREVENTION PERTAINING TO THE HOME:  Any stairs in or around the home? Yes  If so, are there any without handrails? No  Home free of loose throw rugs in walkways, pet beds, electrical cords, etc? Yes  Adequate lighting in your home to reduce risk of falls? Yes   ASSISTIVE DEVICES UTILIZED TO PREVENT FALLS:  Life alert? No  Use of a cane, walker or w/c? No  Grab bars in the bathroom? Yes  Shower chair or bench in shower? No  Elevated toilet seat or a handicapped toilet? No   TIMED UP AND GO:  Was the test performed? Yes .  Length of time to ambulate 10 feet: 10 sec.   Gait steady and fast without use of assistive device  Cognitive Function:     6CIT Screen 04/18/2020  What Year? 0 points  What month? 0 points  Count back from 20 0 points  Months in reverse 4 points  Repeat phrase 2 points    Immunizations Immunization History  Administered Date(s) Administered  . Fluad Quad(high Dose 65+) 01/19/2019, 01/13/2020  . Hepatitis B 06/27/2008  . Influenza Inj Mdck Quad With Preservative 01/27/2018  . Influenza, High Dose Seasonal PF 02/10/2015, 03/20/2016, 01/21/2017, 02/09/2018  . Influenza-Unspecified 02/28/2016, 12/28/2016  . PFIZER SARS-COV-2 Vaccination 06/05/2019, 06/26/2019, 01/26/2020  . Pneumococcal Conjugate-13 02/10/2015  . Pneumococcal Polysaccharide-23 03/20/2016  .  Pneumococcal-Unspecified 02/28/2016  . Tdap 09/20/2014  . Zoster 01/03/2015    TDAP status: Up to date  Flu Vaccine status: Up to date  Pneumococcal vaccine status: Up to date  Covid-19 vaccine status: Completed vaccines  Qualifies for Shingles Vaccine? Yes   Zostavax completed Yes   Shingrix Completed?: No.    Education has been provided regarding the importance of this vaccine. Patient has been advised to call insurance company to determine out of pocket expense if they have not yet received this vaccine. Advised may also receive vaccine at local pharmacy or Health Dept. Verbalized acceptance and understanding.  Screening Tests Health Maintenance  Topic Date Due  . COVID-19 Vaccine (4 - Booster for Pfizer series) 07/25/2020  . MAMMOGRAM  05/10/2021  . TETANUS/TDAP  09/19/2024  . COLONOSCOPY  10/26/2026  . INFLUENZA VACCINE  Completed  . DEXA SCAN  Completed  . Hepatitis C Screening  Completed  . PNA vac Low Risk Adult  Completed    Health Maintenance  There are no preventive care reminders to display for this patient.  Colorectal cancer screening: Type of screening: Colonoscopy. Completed 02/17/20. Repeat every 10 years  Mammogram status: Completed 05/11/19. Repeat every year  Bone Density status: Completed 04/10/16. Results reflect: Bone density results: NORMAL. Repeat every 2 years.   Additional Screening:  Hepatitis C Screening:  Completed 01/06/15  Vision Screening: Recommended annual ophthalmology exams for early detection of glaucoma and other disorders of the eye. Is the patient up to date with their annual eye exam?  Yes  Who is the provider or what is the name of the office in which the patient attends annual eye exams? [facility]     Dental Screening: Recommended annual dental exams for proper oral hygiene  Community Resource Referral / Chronic Care Management: CRR required this visit?  No   CCM required this visit?  No      Plan:      I have personally  reviewed and noted the following in the patient's chart:   . Medical and social history . Use of alcohol, tobacco or illicit drugs  . Current medications and supplements . Functional ability and status . Nutritional status . Physical activity . Advanced directives . List of other physicians . Hospitalizations, surgeries, and ER visits in previous 12 months . Vitals . Screenings to include cognitive, depression, and falls . Referrals and appointments  In addition, I have reviewed and discussed with patient certain preventive protocols, quality metrics, and best practice recommendations. A written personalized care plan for preventive services as well as general preventive health recommendations were provided to patient.     Willette Brace, LPN   [id number]   Nurse Notes: None

## 2020-04-18 NOTE — Patient Instructions (Addendum)
Emma Schneider , Thank you for taking time to come for your Medicare Wellness Visit. I appreciate your ongoing commitment to your health goals. Please review the following plan we discussed and let me know if I can assist you in the future.   Screening recommendations/referrals: Colonoscopy: Done 10/21/1 Mammogram: Done 05/11/19 Bone Density: Done 04/10/16 Recommended yearly ophthalmology/optometry visit for glaucoma screening and checkup Recommended yearly dental visit for hygiene and checkup  Vaccinations: Influenza vaccine: Done 01/13/20 Up to date Pneumococcal vaccine: Up to date Tdap vaccine: Up to date Shingles vaccine: Shingrix discussed. Please contact your pharmacy for coverage information.    Covid-19:Completed 2/6, 2/27, & 01/26/20  Advanced directives: Please bring a copy of your health care power of attorney and living will to the office at your convenience.  Conditions/risks identified: Maintain health  Next appointment: Follow up in one year for your annual wellness visit    Preventive Care 65 Years and Older, Female Preventive care refers to lifestyle choices and visits with your health care provider that can promote health and wellness. What does preventive care include?  A yearly physical exam. This is also called an annual well check.  Dental exams once or twice a year.  Routine eye exams. Ask your health care provider how often you should have your eyes checked.  Personal lifestyle choices, including:  Daily care of your teeth and gums.  Regular physical activity.  Eating a healthy diet.  Avoiding tobacco and drug use.  Limiting alcohol use.  Practicing safe sex.  Taking low-dose aspirin every day.  Taking vitamin and mineral supplements as recommended by your health care provider. What happens during an annual well check? The services and screenings done by your health care provider during your annual well check will depend on your age, overall  health, lifestyle risk factors, and family history of disease. Counseling  Your health care provider may ask you questions about your:  Alcohol use.  Tobacco use.  Drug use.  Emotional well-being.  Home and relationship well-being.  Sexual activity.  Eating habits.  History of falls.  Memory and ability to understand (cognition).  Work and work Statistician.  Reproductive health. Screening  You may have the following tests or measurements:  Height, weight, and BMI.  Blood pressure.  Lipid and cholesterol levels. These may be checked every 5 years, or more frequently if you are over 47 years old.  Skin check.  Lung cancer screening. You may have this screening every year starting at age 47 if you have a 30-pack-year history of smoking and currently smoke or have quit within the past 15 years.  Fecal occult blood test (FOBT) of the stool. You may have this test every year starting at age 38.  Flexible sigmoidoscopy or colonoscopy. You may have a sigmoidoscopy every 5 years or a colonoscopy every 10 years starting at age 63.  Hepatitis C blood test.  Hepatitis B blood test.  Sexually transmitted disease (STD) testing.  Diabetes screening. This is done by checking your blood sugar (glucose) after you have not eaten for a while (fasting). You may have this done every 1-3 years.  Bone density scan. This is done to screen for osteoporosis. You may have this done starting at age 74.  Mammogram. This may be done every 1-2 years. Talk to your health care provider about how often you should have regular mammograms. Talk with your health care provider about your test results, treatment options, and if necessary, the need for more tests.  Vaccines  Your health care provider may recommend certain vaccines, such as:  Influenza vaccine. This is recommended every year.  Tetanus, diphtheria, and acellular pertussis (Tdap, Td) vaccine. You may need a Td booster every 10  years.  Zoster vaccine. You may need this after age 11.  Pneumococcal 13-valent conjugate (PCV13) vaccine. One dose is recommended after age 54.  Pneumococcal polysaccharide (PPSV23) vaccine. One dose is recommended after age 33. Talk to your health care provider about which screenings and vaccines you need and how often you need them. This information is not intended to replace advice given to you by your health care provider. Make sure you discuss any questions you have with your health care provider. Document Released: 05/12/2015 Document Revised: 01/03/2016 Document Reviewed: 02/14/2015 Elsevier Interactive Patient Education  2017 [employer] Prevention in the Home Falls can cause injuries. They can happen to people of all ages. There are many things you can do to make your home safe and to help prevent falls. What can I do on the outside of my home?  Regularly fix the edges of walkways and driveways and fix any cracks.  Remove anything that might make you trip as you walk through a door, such as a raised step or threshold.  Trim any bushes or trees on the path to your home.  Use bright outdoor lighting.  Clear any walking paths of anything that might make someone trip, such as rocks or tools.  Regularly check to see if handrails are loose or broken. Make sure that both sides of any steps have handrails.  Any raised decks and porches should have guardrails on the edges.  Have any leaves, snow, or ice cleared regularly.  Use sand or salt on walking paths during winter.  Clean up any spills in your garage right away. This includes oil or grease spills. What can I do in the bathroom?  Use night lights.  Install grab bars by the toilet and in the tub and shower. Do not use towel bars as grab bars.  Use non-skid mats or decals in the tub or shower.  If you need to sit down in the shower, use a plastic, non-slip stool.  Keep the floor dry. Clean up any water that  spills on the floor as soon as it happens.  Remove soap buildup in the tub or shower regularly.  Attach bath mats securely with double-sided non-slip rug tape.  Do not have throw rugs and other things on the floor that can make you trip. What can I do in the bedroom?  Use night lights.  Make sure that you have a light by your bed that is easy to reach.  Do not use any sheets or blankets that are too big for your bed. They should not hang down onto the floor.  Have a firm chair that has side arms. You can use this for support while you get dressed.  Do not have throw rugs and other things on the floor that can make you trip. What can I do in the kitchen?  Clean up any spills right away.  Avoid walking on wet floors.  Keep items that you use a lot in easy-to-reach places.  If you need to reach something above you, use a strong step stool that has a grab bar.  Keep electrical cords out of the way.  Do not use floor polish or wax that makes floors slippery. If you must use wax, use non-skid floor wax.  Do not have throw rugs and other things on the floor that can make you trip. What can I do with my stairs?  Do not leave any items on the stairs.  Make sure that there are handrails on both sides of the stairs and use them. Fix handrails that are broken or loose. Make sure that handrails are as long as the stairways.  Check any carpeting to make sure that it is firmly attached to the stairs. Fix any carpet that is loose or worn.  Avoid having throw rugs at the top or bottom of the stairs. If you do have throw rugs, attach them to the floor with carpet tape.  Make sure that you have a light switch at the top of the stairs and the bottom of the stairs. If you do not have them, ask someone to add them for you. What else can I do to help prevent falls?  Wear shoes that:  Do not have high heels.  Have rubber bottoms.  Are comfortable and fit you well.  Are closed at the  toe. Do not wear sandals.  If you use a stepladder:  Make sure that it is fully opened. Do not climb a closed stepladder.  Make sure that both sides of the stepladder are locked into place.  Ask someone to hold it for you, if possible.  Clearly mark and make sure that you can see:  Any grab bars or handrails.  First and last steps.  Where the edge of each step is.  Use tools that help you move around (mobility aids) if they are needed. These include:  Canes.  Walkers.  Scooters.  Crutches.  Turn on the lights when you go into a dark area. Replace any light bulbs as soon as they burn out.  Set up your furniture so you have a clear path. Avoid moving your furniture around.  If any of your floors are uneven, fix them.  If there are any pets around you, be aware of where they are.  Review your medicines with your doctor. Some medicines can make you feel dizzy. This can increase your chance of falling. Ask your doctor what other things that you can do to help prevent falls. This information is not intended to replace advice given to you by your health care provider. Make sure you discuss any questions you have with your health care provider. Document Released: 02/09/2009 Document Revised: 09/21/2015 Document Reviewed: 05/20/2014 Elsevier Interactive Patient Education  2017 [employer].

## 2020-05-31 DIAGNOSIS — Z01419 Encounter for gynecological examination (general) (routine) without abnormal findings: Secondary | ICD-10-CM | POA: Diagnosis not present

## 2020-05-31 DIAGNOSIS — Z1231 Encounter for screening mammogram for malignant neoplasm of breast: Secondary | ICD-10-CM | POA: Diagnosis not present

## 2020-06-02 ENCOUNTER — Other Ambulatory Visit: Payer: Self-pay | Admitting: Obstetrics and Gynecology

## 2020-06-02 DIAGNOSIS — E2839 Other primary ovarian failure: Secondary | ICD-10-CM

## 2020-06-09 ENCOUNTER — Other Ambulatory Visit: Payer: Self-pay | Admitting: Family Medicine

## 2020-06-09 ENCOUNTER — Encounter: Payer: Self-pay | Admitting: Family Medicine

## 2020-06-27 ENCOUNTER — Other Ambulatory Visit: Payer: Self-pay

## 2020-06-27 ENCOUNTER — Ambulatory Visit (INDEPENDENT_AMBULATORY_CARE_PROVIDER_SITE_OTHER)
Admission: RE | Admit: 2020-06-27 | Discharge: 2020-06-27 | Disposition: A | Discharge status: Home / Self Care | Payer: Medicare PPO | Source: Ambulatory Visit

## 2020-06-27 DIAGNOSIS — F17210 Nicotine dependence, cigarettes, uncomplicated: Secondary | ICD-10-CM | POA: Diagnosis not present

## 2020-06-27 DIAGNOSIS — Z87891 Personal history of nicotine dependence: Secondary | ICD-10-CM

## 2020-07-04 NOTE — Progress Notes (Signed)

## 2020-07-06 ENCOUNTER — Other Ambulatory Visit: Payer: Self-pay | Admitting: *Deleted

## 2020-07-06 DIAGNOSIS — F17210 Nicotine dependence, cigarettes, uncomplicated: Secondary | ICD-10-CM

## 2020-07-06 DIAGNOSIS — Z87891 Personal history of nicotine dependence: Secondary | ICD-10-CM

## 2020-07-24 ENCOUNTER — Telehealth: Payer: Self-pay | Admitting: Adult Health

## 2020-07-24 DIAGNOSIS — E039 Hypothyroidism, unspecified: Secondary | ICD-10-CM

## 2020-07-24 MED ORDER — LEVOTHYROXINE SODIUM 25 MCG PO TABS: 25.0000 ug | ORAL_TABLET | Freq: Every day | ORAL | 0 refills | Status: DC

## 2020-07-24 MED ORDER — ATORVASTATIN CALCIUM 10 MG PO TABS: 10.0000 mg | ORAL_TABLET | Freq: Every day | ORAL | 0 refills | Status: DC

## 2020-07-24 NOTE — Addendum Note (Signed)
Addended by: Rodrigo Ran on: 07/24/2020 03:20 PM   Modules accepted: Orders

## 2020-07-24 NOTE — Telephone Encounter (Signed)
Rx's sent in. °

## 2020-07-24 NOTE — Telephone Encounter (Signed)
EUTHYROX 25 MCG tablet  atorvastatin (LIPITOR) 10 MG tablet  [place], [place] [address] Phone:  [phone]  Fax:  [phone]

## 2020-10-26 DIAGNOSIS — H02831 Dermatochalasis of right upper eyelid: Secondary | ICD-10-CM | POA: Diagnosis not present

## 2020-10-26 DIAGNOSIS — H2513 Age-related nuclear cataract, bilateral: Secondary | ICD-10-CM | POA: Diagnosis not present

## 2020-10-26 DIAGNOSIS — H52203 Unspecified astigmatism, bilateral: Secondary | ICD-10-CM | POA: Diagnosis not present

## 2020-10-26 DIAGNOSIS — H02834 Dermatochalasis of left upper eyelid: Secondary | ICD-10-CM | POA: Diagnosis not present

## 2020-10-27 ENCOUNTER — Ambulatory Visit
Admission: RE | Admit: 2020-10-27 | Discharge: 2020-10-27 | Disposition: A | Discharge status: Home / Self Care | Payer: Medicare PPO | Source: Ambulatory Visit | Attending: Obstetrics and Gynecology | Admitting: Obstetrics and Gynecology

## 2020-10-27 ENCOUNTER — Other Ambulatory Visit: Payer: Self-pay

## 2020-10-27 DIAGNOSIS — E2839 Other primary ovarian failure: Secondary | ICD-10-CM

## 2020-10-27 DIAGNOSIS — Z780 Asymptomatic menopausal state: Secondary | ICD-10-CM | POA: Diagnosis not present

## 2020-10-27 DIAGNOSIS — M810 Age-related osteoporosis without current pathological fracture: Secondary | ICD-10-CM | POA: Diagnosis not present

## 2020-10-27 DIAGNOSIS — M85851 Other specified disorders of bone density and structure, right thigh: Secondary | ICD-10-CM | POA: Diagnosis not present

## 2020-11-08 ENCOUNTER — Other Ambulatory Visit: Payer: Self-pay | Admitting: Adult Health

## 2020-11-09 DIAGNOSIS — Z681 Body mass index (BMI) 19 or less, adult: Secondary | ICD-10-CM | POA: Diagnosis not present

## 2020-11-09 DIAGNOSIS — M810 Age-related osteoporosis without current pathological fracture: Secondary | ICD-10-CM | POA: Diagnosis not present

## 2020-11-14 DIAGNOSIS — M810 Age-related osteoporosis without current pathological fracture: Secondary | ICD-10-CM | POA: Diagnosis not present

## 2020-11-20 ENCOUNTER — Telehealth: Payer: Self-pay | Admitting: Adult Health

## 2020-11-20 DIAGNOSIS — E039 Hypothyroidism, unspecified: Secondary | ICD-10-CM

## 2020-11-20 NOTE — Telephone Encounter (Signed)
levothyroxine (EUTHYROX) 25 MCG tablet  [place], [place] [id number] Phone:  [phone]  Fax:  [phone]

## 2020-11-21 MED ORDER — LEVOTHYROXINE SODIUM 25 MCG PO TABS: 25.0000 ug | ORAL_TABLET | Freq: Every day | ORAL | 0 refills | Status: DC

## 2020-11-21 NOTE — Addendum Note (Signed)
Addended by: Gwenyth Ober R on: 11/21/2020 11:28 AM   Modules accepted: Orders

## 2020-11-21 NOTE — Telephone Encounter (Signed)
Rx refilled electronically °

## 2021-02-12 ENCOUNTER — Other Ambulatory Visit: Payer: Self-pay

## 2021-02-13 ENCOUNTER — Ambulatory Visit (INDEPENDENT_AMBULATORY_CARE_PROVIDER_SITE_OTHER): Payer: Medicare PPO | Admitting: Adult Health

## 2021-02-13 ENCOUNTER — Encounter: Payer: Self-pay | Admitting: Adult Health

## 2021-02-13 VITALS — BP 120/60 | HR 109 | Temp 97.9°F | Ht 63.5 in | Wt 93.0 lb

## 2021-02-13 DIAGNOSIS — C9201 Acute myeloblastic leukemia, in remission: Secondary | ICD-10-CM

## 2021-02-13 DIAGNOSIS — E039 Hypothyroidism, unspecified: Secondary | ICD-10-CM | POA: Diagnosis not present

## 2021-02-13 DIAGNOSIS — F419 Anxiety disorder, unspecified: Secondary | ICD-10-CM

## 2021-02-13 DIAGNOSIS — E785 Hyperlipidemia, unspecified: Secondary | ICD-10-CM

## 2021-02-13 DIAGNOSIS — F17200 Nicotine dependence, unspecified, uncomplicated: Secondary | ICD-10-CM

## 2021-02-13 DIAGNOSIS — Z0000 Encounter for general adult medical examination without abnormal findings: Secondary | ICD-10-CM

## 2021-02-13 DIAGNOSIS — E278 Other specified disorders of adrenal gland: Secondary | ICD-10-CM

## 2021-02-13 DIAGNOSIS — Z23 Encounter for immunization: Secondary | ICD-10-CM | POA: Diagnosis not present

## 2021-02-13 DIAGNOSIS — E559 Vitamin D deficiency, unspecified: Secondary | ICD-10-CM

## 2021-02-13 LAB — LIPID PANEL
Cholesterol: 124 mg/dL (ref 0–200)
HDL: 55.2 mg/dL (ref >39.00)
LDL Cholesterol: 51 mg/dL (ref 0–99)
NonHDL: 68.5
Total CHOL/HDL Ratio: 2
Triglycerides: 87 mg/dL (ref 0.0–149.0)
VLDL: 17.4 mg/dL (ref 0.0–40.0)

## 2021-02-13 LAB — CBC WITH DIFFERENTIAL/PLATELET
Basophils Absolute: 0 10*3/uL (ref 0.0–0.1)
Basophils Relative: 0.5 % (ref 0.0–3.0)
Eosinophils Absolute: 0 10*3/uL (ref 0.0–0.7)
Eosinophils Relative: 0.3 % (ref 0.0–5.0)
HCT: 40.8 % (ref 36.0–46.0)
Hemoglobin: 13.8 g/dL (ref 12.0–15.0)
Lymphocytes Relative: 19.7 % (ref 12.0–46.0)
Lymphs Abs: 1.4 10*3/uL (ref 0.7–4.0)
MCHC: 33.8 g/dL (ref 30.0–36.0)
MCV: 89.9 fl (ref 78.0–100.0)
Monocytes Absolute: 0.4 10*3/uL (ref 0.1–1.0)
Monocytes Relative: 6.3 % (ref 3.0–12.0)
Neutro Abs: 5.1 10*3/uL (ref 1.4–7.7)
Neutrophils Relative %: 73.2 % (ref 43.0–77.0)
Platelets: 286 10*3/uL (ref 150.0–400.0)
RBC: 4.54 Mil/uL (ref 3.87–5.11)
RDW: 13.3 % (ref 11.5–15.5)
WBC: 6.9 10*3/uL (ref 4.0–10.5)

## 2021-02-13 LAB — COMPREHENSIVE METABOLIC PANEL
ALT: 13 U/L (ref 0–35)
AST: 18 U/L (ref 0–37)
Albumin: 4.1 g/dL (ref 3.5–5.2)
Alkaline Phosphatase: 75 U/L (ref 39–117)
BUN: 15 mg/dL (ref 6–23)
CO2: 29 mEq/L (ref 19–32)
Calcium: 9.5 mg/dL (ref 8.4–10.5)
Chloride: 102 mEq/L (ref 96–112)
Creatinine, Ser: 0.93 mg/dL (ref 0.40–1.20)
GFR: 62 mL/min (ref >60.00)
Glucose, Bld: 104 mg/dL — ABNORMAL HIGH (ref 70–99)
Potassium: 3.2 mEq/L — ABNORMAL LOW (ref 3.5–5.1)
Sodium: 140 mEq/L (ref 135–145)
Total Bilirubin: 0.8 mg/dL (ref 0.2–1.2)
Total Protein: 7.1 g/dL (ref 6.0–8.3)

## 2021-02-13 LAB — TSH: TSH: 1.95 u[IU]/mL (ref 0.35–5.50)

## 2021-02-13 LAB — VITAMIN D 25 HYDROXY (VIT D DEFICIENCY, FRACTURES): VITD: 43.79 ng/mL (ref 30.00–100.00)

## 2021-02-13 MED ORDER — ATORVASTATIN CALCIUM 10 MG PO TABS: 10.0000 mg | ORAL_TABLET | Freq: Every day | ORAL | 3 refills | Status: DC

## 2021-02-13 MED ORDER — BUPROPION HCL ER (XL) 150 MG PO TB24: 150.0000 mg | ORAL_TABLET | Freq: Every day | ORAL | 0 refills | Status: DC

## 2021-02-13 NOTE — Progress Notes (Signed)
Subjective:    Patient ID: Emma Schneider, female    DOB: 1950/11/10, 70 y.o.   MRN: [id number]  HPI Patient presents for yearly preventative medicine examination. 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  Hyperlipidemia -prescribe Lipitor 10 mg daily.  She denies fatigue, chest pain, or shortness of breath. Lab Results  Component Value Date   CHOL 148 02/10/2020   HDL 53 02/10/2020   LDLCALC 76 02/10/2020   TRIG 102 02/10/2020   CHOLHDL 2.8 02/10/2020   Osteopenia-currently prescribed Actonel 35 mg IV yearly. This is managed by GYN. Her last bone density was 11/15/2020. 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  In the past she has been managed by [facility] hematology/oncology on a yearly basis.  Her last visit with [facility] was in September 2020 at which time they thought that she could be managed by her PCP with yearly CBCs as the risk for relapse at this time was incredibly low.  She denies fevers, chills, anorexia, weight loss, nausea, vomiting, or diarrhea. Lab Results  Component Value Date   WBC 7.5 02/10/2020   HGB 15.1 02/10/2020   HCT 44.5 02/10/2020   MCV 90.1 02/10/2020   PLT 334 02/10/2020   Right adrenal adenoma-is followed by [facility] endocrinology  Vitamin D deficiency- takes Vitamin D 3000 units.  and calcium  Hypothyroidism-currently prescribed Synthroid 25 mcg. Lab Results  Component Value Date   TSH 2.35 02/10/2020   Tobacco use-continues to smoke less than half a pack a day.  Denies shortness of breath or chest pain.  Anxiety - in May 2022 she found out that her husband was having an affair with multiple men. She reports that she is no longer depressed but does feel on edge and anxious. Her husband will not give  her a divorce. They continue to live together and the living situation is not idea. She is open to medication to help with her symptoms.  All immunizations and health maintenance protocols were reviewed with the patient and needed orders were placed.  Appropriate screening laboratory values were ordered for the patient including screening of hyperlipidemia, renal function and hepatic function.  Medication reconciliation,  past medical history, social history, problem list and allergies were reviewed in detail with the patient  Goals were established with regard to weight loss, exercise, and  diet in compliance with medications  She is up to date on routine colon cancer screening  Review of Systems  Constitutional: Negative.   HENT: Negative.    Eyes: Negative.   Respiratory: Negative.    Cardiovascular: Negative.   Gastrointestinal: Negative.   Endocrine: Negative.   Genitourinary: Negative.   Musculoskeletal: Negative.   Skin: Negative.   Allergic/Immunologic: Negative.   Neurological: Negative.   Hematological: Negative.   Psychiatric/Behavioral:  Negative for sleep disturbance. The patient is nervous/anxious.    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[place])    Aortic atherosclerosis ([place])    Blood in urine    Chicken pox    Hyperlipidemia    Leukemia ([place])    Vitamin D deficiency     Social History   Socioeconomic History   Marital status: Married    Spouse name: Not on file   Number of children: Not on  file   Years of education: Not on file   Highest education level: Not on file  Occupational History   Occupation: retired  Tobacco Use   Smoking status: Every Day    Packs/day: 1.00    Years: 42.00    Pack years: 42.00    Types: Cigarettes   Smokeless tobacco: Never   Tobacco comments:    Is actively trying to cut down number of cigarettes, but not ready to set a quit date.  Substance and Sexual Activity   Alcohol use: No     Alcohol/week: 0.0 standard drinks   Drug use: No   Sexual activity: Not on file  Other Topics Concern   Not on file  Social History Narrative   -She works at a Pharmacist, community office as an Environmental consultant.    -Married for 30 years    - Has step children, all live in [place]   - No pets   - Plays games on the computer and enjoys yard work.    Social Determinants of Health   Financial Resource Strain: Low Risk    Difficulty of Paying Living Expenses: Not hard at all  Food Insecurity: No Food Insecurity   Worried About Charity fundraiser in the Last Year: Never true   [place] in the Last Year: Never true  Transportation Needs: No Transportation Needs   Lack of Transportation (Medical): No   Lack of Transportation (Non-Medical): No  Physical Activity: Inactive   Days of Exercise per Week: 0 days   Minutes of Exercise per Session: 0 min  Stress: No Stress Concern Present   Feeling of Stress : Not at all  Social Connections: Moderately Integrated   Frequency of Communication with Friends and Family: More than three times a week   Frequency of Social Gatherings with Friends and Family: Twice a week   Attends Religious Services: More than 4 times per year   Active Member of [employer] or Organizations: No   Attends Archivist Meetings: Never   Marital Status: Married  Human resources officer Violence: Not At Risk   Fear of Current or Ex-Partner: No   Emotionally Abused: No   Physically Abused: No   Sexually Abused: No    History reviewed. No pertinent surgical history.  Family History  Problem Relation Age of Onset   Congestive Heart Failure Mother    Heart attack Father    Heart disease Father     No Known Allergies  Current Outpatient Medications on File Prior to Visit  Medication Sig Dispense Refill   atorvastatin (LIPITOR) 10 MG tablet Take 1 tablet by mouth once daily 90 tablet 0   calcium carbonate (OS-CAL) 600 MG TABS tablet Take 1,200 mg by mouth daily.     Cholecalciferol  (VITAMIN D3) 25 MCG (1000 UT) CAPS 1 capsule     Cholecalciferol 3000 units TABS Take 3,000 Units by mouth daily. 90 tablet 3   ferrous sulfate 324 MG TBEC Take 324 mg by mouth.     Ferrous Sulfate Dried (FEOSOL) 200 (65 Fe) MG TABS See admin instructions.     levothyroxine (EUTHYROX) 25 MCG tablet Take 1 tablet (25 mcg total) by mouth daily before breakfast. 90 tablet 0   Zoledronic Acid (ZOMETA) 4 MG/100ML IVPB 100 ml over 15 minutes     No current facility-administered medications on file prior to visit.    BP 120/60   Pulse (!) 109   Temp 97.9 F (36.6 C) (Oral)  Ht 5' 3.5" (1.613 m)   Wt 93 lb (42.2 kg)   SpO2 96%   BMI 16.22 kg/m        Objective:   Physical Exam Vitals and nursing note reviewed.  Constitutional:      General: She is not in acute distress.    Appearance: Normal appearance. She is well-developed and underweight. She is not ill-appearing.  HENT:     Head: Normocephalic and atraumatic.     Right Ear: Tympanic membrane, ear canal and external ear normal. There is no impacted cerumen.     Left Ear: Tympanic membrane, ear canal and external ear normal. There is no impacted cerumen.     Nose: Nose normal. No congestion or rhinorrhea.     Mouth/Throat:     Mouth: Mucous membranes are moist.     Pharynx: Oropharynx is clear. No oropharyngeal exudate or posterior oropharyngeal erythema.  Eyes:     General:        Right eye: No discharge.        Left eye: No discharge.     Extraocular Movements: Extraocular movements intact.     Conjunctiva/sclera: Conjunctivae normal.     Pupils: Pupils are equal, round, and reactive to light.  Neck:     Thyroid: No thyromegaly.     Vascular: No carotid bruit.     Trachea: No tracheal deviation.  Cardiovascular:     Rate and Rhythm: Normal rate and regular rhythm.     Pulses: Normal pulses.     Heart sounds: Normal heart sounds. No murmur heard.   No friction rub. No gallop.  Pulmonary:     Effort: Pulmonary effort  is normal. No respiratory distress.     Breath sounds: Normal breath sounds. No stridor. No wheezing, rhonchi or rales.  Chest:     Chest wall: No tenderness.  Abdominal:     General: Abdomen is flat. Bowel sounds are normal. There is no distension.     Palpations: Abdomen is soft. There is no mass.     Tenderness: There is no abdominal tenderness. There is no right CVA tenderness, left CVA tenderness, guarding or rebound.     Hernia: No hernia is present.  Musculoskeletal:        General: No swelling, tenderness, deformity or signs of injury. Normal range of motion.     Cervical back: Normal range of motion and neck supple.     Right lower leg: No edema.     Left lower leg: No edema.  Lymphadenopathy:     Cervical: No cervical adenopathy.  Skin:    General: Skin is warm and dry.     Coloration: Skin is not jaundiced or pale.     Findings: No bruising, erythema, lesion or rash.  Neurological:     General: No focal deficit present.     Mental Status: She is alert and oriented to person, place, and time.     Cranial Nerves: No cranial nerve deficit.     Sensory: No sensory deficit.     Motor: No weakness.     Coordination: Coordination normal.     Gait: Gait normal.     Deep Tendon Reflexes: Reflexes normal.  Psychiatric:        Mood and Affect: Mood normal.        Behavior: Behavior normal.        Thought Content: Thought content normal.        Judgment: Judgment normal.  Assessment & Plan:   1. Routine general medical examination at a health care facility - Follow up in one year or sooner if needed - CBC with Differential/Platelet; Future - Comprehensive metabolic panel; Future - Lipid panel; Future - TSH; Future  2. Hypothyroidism, unspecified type - Consider dose increase in synthroid  - CBC with Differential/Platelet; Future - Comprehensive metabolic panel; Future - Lipid panel; Future - TSH; Future  3. 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 Vitamin D deficiency  - Vitamin D, 25-hydroxy; Future  5. Hyperlipidemia, unspecified hyperlipidemia type  - CBC with Differential/Platelet; Future - Comprehensive metabolic panel; Future - Lipid panel; Future - TSH; Future - atorvastatin (LIPITOR) 10 MG tablet; Take 1 tablet (10 mg total) by mouth daily.  Dispense: 90 tablet; Refill: 3  6. Acute myeloid leukemia in remission ([place])  - CBC with Differential/Platelet; Future - Comprehensive metabolic panel; Future - Lipid panel; Future - TSH; Future  7. Tobacco use disorder - encouraged to quit smoking. Hopefull wellbutrin will help with this   8. Need for immunization against influenza  - Flu Vaccine QUAD High Dose(Fluad)  9. Anxiety  - buPROPion (WELLBUTRIN XL) 150 MG 24 hr tablet; Take 1 tablet (150 mg total) by mouth daily.  Dispense: 90 tablet; Refill: 0 - Follow up in one month or sooner if needed  Dorothyann Peng, NP

## 2021-02-15 ENCOUNTER — Telehealth: Payer: Self-pay | Admitting: Adult Health

## 2021-02-15 NOTE — Telephone Encounter (Signed)
Patient called again for follow up on missed phone call yesterday. I let patient know it looked like they were calling to give lab results and I would leave a message for them to call back.    Good callback number is [phone]     Please Advise

## 2021-02-15 NOTE — Telephone Encounter (Signed)
Caller returning missed call to the office.

## 2021-02-20 NOTE — Telephone Encounter (Signed)
Pt notified of update and verbalized understanding.  

## 2021-03-01 ENCOUNTER — Other Ambulatory Visit: Payer: Self-pay | Admitting: Adult Health

## 2021-03-01 DIAGNOSIS — E785 Hyperlipidemia, unspecified: Secondary | ICD-10-CM

## 2021-03-01 DIAGNOSIS — E039 Hypothyroidism, unspecified: Secondary | ICD-10-CM

## 2021-03-16 ENCOUNTER — Encounter: Payer: Self-pay | Admitting: Adult Health

## 2021-03-16 ENCOUNTER — Ambulatory Visit: Payer: Medicare PPO | Admitting: Adult Health

## 2021-03-16 VITALS — BP 110/60 | HR 106 | Temp 97.0°F | Ht 63.5 in | Wt 91.6 lb

## 2021-03-16 DIAGNOSIS — F419 Anxiety disorder, unspecified: Secondary | ICD-10-CM

## 2021-03-16 NOTE — Progress Notes (Signed)
Subjective:    Patient ID: Emma Schneider, female    DOB: 05-14-1950, 70 y.o.   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  She presents to the office today for 1 month follow-up regarding anxiety.  When she was last seen in October 2022 she was started on Butrans 150 mg extended release daily.  Her anxiety stems from finding out in May 2022 that her husband was having an affair with multiple different men.  Husband would not give her a divorce and they continue to live together.  She is no longer feeling depressed but felt on edge and anxious.  Today she reports that she is starting to feel more calm and less on edge every day. She has been taking the medication every day and not having any side effects such as n/v/d/anorexia. She would like to stay on the medication for the time being.    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[place])    Aortic atherosclerosis ([place])    Blood in urine    Chicken pox    Hyperlipidemia    Leukemia ([place])    Vitamin D deficiency     Social History   Socioeconomic History   Marital status: Married    Spouse name: Not on file   Number of children: Not on file   Years of education: Not on file   Highest education level: Not on file  Occupational History   Occupation: retired  Tobacco Use   Smoking status: Every Day    Packs/day: 1.00    Years: 42.00    Pack years: 42.00    Types: Cigarettes   Smokeless tobacco: Never   Tobacco comments:    Is actively trying to cut down number of cigarettes, but not ready to set a quit date.  Substance and Sexual Activity   Alcohol use: No    Alcohol/week: 0.0 standard drinks   Drug use: No   Sexual activity: Not on file  Other Topics Concern   Not on file  Social History  Narrative   -She works at a Pharmac[employer], community office as an Environmental consultant.    -Married for 30 years    - Has step children, all live in [place]   - No pets   - Plays games on the computer and enjoys yard work.    Social Determinants of Health   Financial Resource Strain: Low Risk    Difficulty of Paying Living Expenses: Not hard at all  Food Insecurity: No Food Insecurity   Worried About Charity fundraiser in the Last Year: Never true   [place] in the Last Year: Never true  Transportation Needs: No Transportation Needs   Lack of Transportation (Medical): No   Lack of Transportation (Non-Medical): No  Physical Activity: Inactive   Days of Exercise per Week: 0 days   Minutes of Exercise per Session: 0 min  Stress: No Stress Concern Present   Feeling of Stress : Not at all  Social Connections: Moderately Integrated   Frequency of Communication with Friends and Family: More than three times a week   Frequency of Social Gatherings with Friends and Family: Twice a week   Attends Religious Services: More than 4 times per year   Active Member of  Clubs or Organizations: No   Attends Archivist Meetings: Never   Marital Status: Married  Human resources officer Violence: Not At Risk   Fear of Current or Ex-Partner: No   Emotionally Abused: No   Physically Abused: No   Sexually Abused: No    History reviewed. No pertinent surgical history.  Family History  Problem Relation Age of Onset   Congestive Heart Failure Mother    Heart attack Father    Heart disease Father     No Known Allergies  Current Outpatient Medications on File Prior to Visit  Medication Sig Dispense Refill   atorvastatin (LIPITOR) 10 MG tablet Take 1 tablet by mouth once daily 90 tablet 0   buPROPion (WELLBUTRIN XL) 150 MG 24 hr tablet Take 1 tablet (150 mg total) by mouth daily. 90 tablet 0   calcium carbonate (OS-CAL) 600 MG TABS tablet Take 1,200 mg by mouth daily.     Cholecalciferol (VITAMIN D3) 25 MCG (1000 UT)  CAPS 1 capsule     Cholecalciferol 3000 units TABS Take 3,000 Units by mouth daily. 90 tablet 3   ferrous sulfate 324 MG TBEC Take 324 mg by mouth.     Ferrous Sulfate Dried (FEOSOL) 200 (65 Fe) MG TABS See admin instructions.     levothyroxine (SYNTHROID) 25 MCG tablet TAKE 1 TABLET BY MOUTH ONCE DAILY BEFORE BREAKFAST 90 tablet 0   Zoledronic Acid (ZOMETA) 4 MG/100ML IVPB 100 ml over 15 minutes     No current facility-administered medications on file prior to visit.    BP 110/60   Pulse (!) 106   Temp (!) 97 F (36.1 C) (Oral)   Ht 5' 3.5" (1.613 m)   Wt 91 lb 9.6 oz (41.5 kg)   SpO2 100%   BMI 15.97 kg/m       Objective:   Physical Exam Vitals and nursing note reviewed.  Constitutional:      Appearance: Normal appearance.  Cardiovascular:     Rate and Rhythm: Normal rate and regular rhythm.     Pulses: Normal pulses.     Heart sounds: Normal heart sounds.  Pulmonary:     Effort: Pulmonary effort is normal.     Breath sounds: Normal breath sounds.  Musculoskeletal:        General: Normal range of motion.  Skin:    General: Skin is warm and dry.     Capillary Refill: Capillary refill takes less than 2 seconds.  Neurological:     General: No focal deficit present.     Mental Status: She is alert and oriented to person, place, and time.  Psychiatric:        Mood and Affect: Mood normal.        Behavior: Behavior normal.        Thought Content: Thought content normal.        Judgment: Judgment normal.      Assessment & Plan:  1. Anxiety - Much better controlled and she seems to be in a better place.  - Advised follow up as needed for anxiety   Dorothyann Peng, NP

## 2021-04-02 ENCOUNTER — Telehealth: Payer: Self-pay

## 2021-04-02 ENCOUNTER — Ambulatory Visit: Payer: Medicare PPO | Admitting: Family Medicine

## 2021-04-02 ENCOUNTER — Encounter: Payer: Self-pay | Admitting: Family Medicine

## 2021-04-02 VITALS — BP 110/60 | HR 59 | Temp 98.5°F | Wt 91.4 lb

## 2021-04-02 DIAGNOSIS — F419 Anxiety disorder, unspecified: Secondary | ICD-10-CM | POA: Diagnosis not present

## 2021-04-02 DIAGNOSIS — R441 Visual hallucinations: Secondary | ICD-10-CM

## 2021-04-02 NOTE — Telephone Encounter (Signed)
Husband called stating that patient is having side effects to rx and would like to discuss a medication change.  Husband has stopped patient from taking the medication until she can be seen  buPROPion (WELLBUTRIN XL) 150 MG 24 hr tablet

## 2021-04-02 NOTE — Progress Notes (Signed)
   Subjective:    Patient ID: Emma Schneider, female    DOB: [birth year], 70 y.o.   MRN: [id number]  HPI Here with her husband and daughter to discuss some visual hallucinations that the patient has been having for the past 4 days. She describes seeing "dogs and cats" in her house under chairs or on her porch, when in fact there are no animals there. She has also seen dogs roaming through her house. She believes these visions to be true even when family members talk to her about them. These hallucinations seem to be peaceful and not threatening to her in any way. Of note she was started on Wellbutrin XL 150 mg daily on 02-13-21 by her by Dorothyann Peng, her PCP. She had been dealing with some anxiety so she asked him for help. This seemed to help somewhat. Her sleep and appetite are intact. She had complete lab work in October, including a thyroid screen, that was normal.    Review of Systems  Constitutional: Negative.   Respiratory: Negative.    Cardiovascular: Negative.   Psychiatric/Behavioral:  Positive for hallucinations. Negative for agitation, behavioral problems, confusion, decreased concentration, dysphoric mood, self-injury and sleep disturbance. The patient is nervous/anxious.       Objective:   Physical Exam Constitutional:      General: She is not in acute distress.    Appearance: Normal appearance.  Cardiovascular:     Rate and Rhythm: Normal rate and regular rhythm.     Pulses: Normal pulses.     Heart sounds: Normal heart sounds.  Pulmonary:     Effort: Pulmonary effort is normal.     Breath sounds: Normal breath sounds.  Neurological:     General: No focal deficit present.     Mental Status: She is alert and oriented to person, place, and time.  Psychiatric:        Mood and Affect: Mood normal.        Behavior: Behavior normal.        Thought Content: Thought content normal.          Assessment & Plan:  She is having visual hallucinations that are likely side  effects of Wellbutrin. We agreed to stop this and they will observe her closely in the next few days to weeks. Hopefully the hallucinations will disappear. If not, she will follow up with her PCP for further workup.  Alysia Penna, MD

## 2021-04-03 ENCOUNTER — Ambulatory Visit: Payer: Medicare PPO | Admitting: Family Medicine

## 2021-04-04 NOTE — Telephone Encounter (Signed)
FYI  Spoke to pt husband and he stated that pt started the antidepressant and he stated that the medication is not helping. Pt husband stated that the pt was hallucinating. Pt thought she was suppose to take it prn but husband stated it was suppose to be daily. Pt spouse stated that pt went haywire after taking it daily. Pt saw another provider on Monday and was taking off of medication. Pt spouse advised pt  is no longer hallucinating. Short term memory and orientation is getting better.

## 2021-04-10 ENCOUNTER — Encounter: Payer: Self-pay | Admitting: Psychology

## 2021-04-10 ENCOUNTER — Encounter: Payer: Self-pay | Admitting: Adult Health

## 2021-04-10 ENCOUNTER — Ambulatory Visit: Payer: Medicare PPO | Admitting: Adult Health

## 2021-04-10 VITALS — BP 140/68 | HR 97 | Temp 98.1°F | Ht 63.5 in | Wt 91.0 lb

## 2021-04-10 DIAGNOSIS — R4189 Other symptoms and signs involving cognitive functions and awareness: Secondary | ICD-10-CM

## 2021-04-10 DIAGNOSIS — F32A Depression, unspecified: Secondary | ICD-10-CM | POA: Diagnosis not present

## 2021-04-10 DIAGNOSIS — F419 Anxiety disorder, unspecified: Secondary | ICD-10-CM

## 2021-04-10 DIAGNOSIS — R634 Abnormal weight loss: Secondary | ICD-10-CM

## 2021-04-10 MED ORDER — MIRTAZAPINE 7.5 MG PO TABS: 7.5000 mg | ORAL_TABLET | Freq: Every day | ORAL | 0 refills | Status: DC

## 2021-04-10 NOTE — Progress Notes (Signed)
Subjective:    Patient ID: Emma Schneider, female    DOB: August 23, 1950, 70 y.o.   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  She presents to the office today with her husband and daughter.   She presents to the office today for follow up regarding hallucinations. She was seen by another provider in the office about a week ago as she was having visual hallucinations. When she was seen last time she described seeing dogs and cats in her house, under the chairs, and on her porch. I had started her on Wellbutrin XL 150 mg daily on 02-13-2021 as she had been dealing with some anxiety that started in May 2022 after she found out that her husband was having an affair with multiple different.  Wellbutrin was stopped and the hallucinations resolved.   Today her husband feels as though the patient's short-term memory is fading, she cannot remember tasks to do on a daily basis, and is having more difficulty with reading comprehension.  She continues to have anxiety and depression stemming from alleged affairs that the husband and daughter report are not true.  Patient believes that she will wake up in the [place] and hear her husband talking about the affairs that he has had.  She has also had some weight loss over the last year as she does not feel as hungry any longer.    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[place])     Aortic atherosclerosis ([place])    Blood in urine    Chicken pox    Hyperlipidemia    Leukemia ([facility])    Vitamin D deficiency     Social History   Socioeconomic History   Marital status: Married    Spouse name: Not on file   Number of children: Not on file   Years of education: Not on file   Highest education level: Not on file  Occupational History   Occupation: retired  Tobacco Use   Smoking status: Every Day    Packs/day: 1.00    Years: 42.00    Pack years: 42.00    Types: Cigarettes   Smokeless tobacco: Never   Tobacco comments:    Is actively trying to cut down number of cigarettes, but not ready to set a quit date.  Substance and Sexual Activity   Alcohol use: No    Alcohol/week: 0.0 standard drinks   Drug use: No   Sexual activity: Not on file  Other Topics Concern   Not on file  Social History Narrative   -She works at a Pharmacist, community office as an Environmental consultant.    -Married for 30 years    - Has step children, all live in [place]   - No pets   - Plays games on the computer and enjoys yard work.  Social Determinants of Health   Financial Resource Strain: Low Risk    Difficulty of Paying Living Expenses: Not hard at all  Food Insecurity: No Food Insecurity   Worried About Charity fundraiser in the Last Year: Never true   [place] in the Last Year: Never true  Transportation Needs: No Transportation Needs   Lack of Transportation (Medical): No   Lack of Transportation (Non-Medical): No  Physical Activity: Inactive   Days of Exercise per Week: 0 days   Minutes of Exercise per Session: 0 min  Stress: No Stress Concern Present   Feeling of Stress : Not at all  Social Connections: Moderately Integrated   Frequency of Communication with Friends and Family: More than three times a week   Frequency of Social Gatherings with Friends and Family: Twice a week   Attends Religious Services: More than 4 times per year   Active Member of [employer] or Organizations: No   Attends  Archivist Meetings: Never   Marital Status: Married  Human resources officer Violence: Not At Risk   Fear of Current or Ex-Partner: No   Emotionally Abused: No   Physically Abused: No   Sexually Abused: No    History reviewed. No pertinent surgical history.  Family History  Problem Relation Age of Onset   Congestive Heart Failure Mother    Heart attack Father    Heart disease Father     Allergies  Allergen Reactions   Wellbutrin [Bupropion] Other (See Comments)    Hallucinations     Current Outpatient Medications on File Prior to Visit  Medication Sig Dispense Refill   atorvastatin (LIPITOR) 10 MG tablet Take 1 tablet by mouth once daily 90 tablet 0   calcium carbonate (OS-CAL) 600 MG TABS tablet Take 1,200 mg by mouth daily.     Cholecalciferol (VITAMIN D3) 25 MCG (1000 UT) CAPS 1 capsule     Cholecalciferol 3000 units TABS Take 3,000 Units by mouth daily. 90 tablet 3   ferrous sulfate 324 MG TBEC Take 324 mg by mouth.     Ferrous Sulfate Dried (FEOSOL) 200 (65 Fe) MG TABS See admin instructions.     levothyroxine (SYNTHROID) 25 MCG tablet TAKE 1 TABLET BY MOUTH ONCE DAILY BEFORE BREAKFAST 90 tablet 0   Zoledronic Acid (ZOMETA) 4 MG/100ML IVPB 100 ml over 15 minutes     No current facility-administered medications on file prior to visit.    BP 140/68    Pulse 97    Temp 98.1 F (36.7 C) (Oral)    Ht 5' 3.5" (1.613 m)    Wt 91 lb (41.3 kg)    SpO2 99%    BMI 15.87 kg/m       Objective:   Physical Exam Vitals and nursing note reviewed.  Constitutional:      Appearance: Normal appearance.  Cardiovascular:     Rate and Rhythm: Normal rate and regular rhythm.     Pulses: Normal pulses.     Heart sounds: Normal heart sounds.  Pulmonary:     Effort: Pulmonary effort is normal.     Breath sounds: Normal breath sounds.  Skin:    General: Skin is warm and dry.  Neurological:     General: No focal deficit present.     Mental Status: She is alert and oriented to  person, place, and time.  Psychiatric:        Mood and Affect: Mood normal. Affect is tearful.  Behavior: Behavior normal.        Thought Content: Thought content is delusional.        Judgment: Judgment normal.     Comments: Talks about husbands alleged affairs during the exam today      Assessment & Plan:  1. Cognitive impairment -Likely contributing factor to weight loss.  I am wondering if the Wellbutrin actually caused her hallucinations or if this is more of dementia.  Will refer to neurology for neuropsych testing and order MRI brain. - Ambulatory referral to Neurology - MR Brain Wo Contrast; Future  2. Anxiety and depression - Likely contributing factor to weight loss. - Will start on low dose Remeron  - Follow up in one month  - mirtazapine (REMERON) 7.5 MG tablet; Take 1 tablet (7.5 mg total) by mouth at bedtime.  Dispense: 30 tablet; Refill: 0  3. Unintentional weight loss  - mirtazapine (REMERON) 7.5 MG tablet; Take 1 tablet (7.5 mg total) by mouth at bedtime.  Dispense: 30 tablet; Refill: 0   Dorothyann Peng, NP  Time spent on chart review, time with patient; discussion of cognitive impairment, anxiety/depression, and weight loss.  treatment, follow up plan, and documentation 45 minutes

## 2021-04-10 NOTE — Patient Instructions (Signed)
It was great seeing you today   I am going to refer you to Neuropsych for testing and get an MRI of the brain - both will call you to schedule   I am also going to start you on Remeron to help with the depression and weight loss  Please follow up in one month or sooner if needed

## 2021-05-01 ENCOUNTER — Ambulatory Visit (INDEPENDENT_AMBULATORY_CARE_PROVIDER_SITE_OTHER): Payer: Medicare PPO

## 2021-05-01 ENCOUNTER — Other Ambulatory Visit: Payer: Self-pay

## 2021-05-01 VITALS — BP 118/62 | HR 102 | Temp 97.9°F | Ht 64.0 in | Wt 93.3 lb

## 2021-05-01 DIAGNOSIS — Z0000 Encounter for general adult medical examination without abnormal findings: Secondary | ICD-10-CM | POA: Diagnosis not present

## 2021-05-01 NOTE — Patient Instructions (Signed)
Ms. Ramanathan , Thank you for taking time to come for your Medicare Wellness Visit. I appreciate your ongoing commitment to your health goals. Please review the following plan we discussed and let me know if I can assist you in the future.   Screening recommendations/referrals: Colonoscopy: completed 02/17/2020 Mammogram: completed 05/11/2019 Bone Density: completed 10/27/2020 Recommended yearly ophthalmology/optometry visit for glaucoma screening and checkup Recommended yearly dental visit for hygiene and checkup  Vaccinations: Influenza vaccine: completed 02/13/2021 Pneumococcal vaccine: completed 03/20/2016 Tdap vaccine: completed 09/20/2014, due 09/19/2024 Shingles vaccine: discussed   Covid-19: 01/26/2020, 06/26/2019, 06/05/2019  Advanced directives: Please bring a copy of your POA (Power of Attorney) and/or Living Will to your next appointment.   Conditions/risks identified: smoking  Next appointment: Follow up in one year for your annual wellness visit    Preventive Care 72 Years and Older, Female Preventive care refers to lifestyle choices and visits with your health care provider that can promote health and wellness. What does preventive care include? A yearly physical exam. This is also called an annual well check. Dental exams once or twice a year. Routine eye exams. Ask your health care provider how often you should have your eyes checked. Personal lifestyle choices, including: Daily care of your teeth and gums. Regular physical activity. Eating a healthy diet. Avoiding tobacco and drug use. Limiting alcohol use. Practicing safe sex. Taking low-dose aspirin every day. Taking vitamin and mineral supplements as recommended by your health care provider. What happens during an annual well check? The services and screenings done by your health care provider during your annual well check will depend on your age, overall health, lifestyle risk factors, and family history of  disease. Counseling  Your health care provider may ask you questions about your: Alcohol use. Tobacco use. Drug use. Emotional well-being. Home and relationship well-being. Sexual activity. Eating habits. History of falls. Memory and ability to understand (cognition). Work and work Statistician. Reproductive health. Screening  You may have the following tests or measurements: Height, weight, and BMI. Blood pressure. Lipid and cholesterol levels. These may be checked every 5 years, or more frequently if you are over 31 years old. Skin check. Lung cancer screening. You may have this screening every year starting at age 74 if you have a 30-pack-year history of smoking and currently smoke or have quit within the past 15 years. Fecal occult blood test (FOBT) of the stool. You may have this test every year starting at age 18. Flexible sigmoidoscopy or colonoscopy. You may have a sigmoidoscopy every 5 years or a colonoscopy every 10 years starting at age 32. Hepatitis C blood test. Hepatitis B blood test. Sexually transmitted disease (STD) testing. Diabetes screening. This is done by checking your blood sugar (glucose) after you have not eaten for a while (fasting). You may have this done every 1-3 years. Bone density scan. This is done to screen for osteoporosis. You may have this done starting at age 50. Mammogram. This may be done every 1-2 years. Talk to your health care provider about how often you should have regular mammograms. Talk with your health care provider about your test results, treatment options, and if necessary, the need for more tests. Vaccines  Your health care provider may recommend certain vaccines, such as: Influenza vaccine. This is recommended every year. Tetanus, diphtheria, and acellular pertussis (Tdap, Td) vaccine. You may need a Td booster every 10 years. Zoster vaccine. You may need this after age 59. Pneumococcal 13-valent conjugate (PCV13) vaccine. One  dose is recommended after age 44. Pneumococcal polysaccharide (PPSV23) vaccine. One dose is recommended after age 69. Talk to your health care provider about which screenings and vaccines you need and how often you need them. This information is not intended to replace advice given to you by your health care provider. Make sure you discuss any questions you have with your health care provider. Document Released: 05/12/2015 Document Revised: 01/03/2016 Document Reviewed: 02/14/2015 Elsevier Interactive Patient Education  2017 [place] Prevention in the Home Falls can cause injuries. They can happen to people of all ages. There are many things you can do to make your home safe and to help prevent falls. What can I do on the outside of my home? Regularly fix the edges of walkways and driveways and fix any cracks. Remove anything that might make you trip as you walk through a door, such as a raised step or threshold. Trim any bushes or trees on the path to your home. Use bright outdoor lighting. Clear any walking paths of anything that might make someone trip, such as rocks or tools. Regularly check to see if handrails are loose or broken. Make sure that both sides of any steps have handrails. Any raised decks and porches should have guardrails on the edges. Have any leaves, snow, or ice cleared regularly. Use sand or salt on walking paths during winter. Clean up any spills in your garage right away. This includes oil or grease spills. What can I do in the bathroom? Use night lights. Install grab bars by the toilet and in the tub and shower. Do not use towel bars as grab bars. Use non-skid mats or decals in the tub or shower. If you need to sit down in the shower, use a plastic, non-slip stool. Keep the floor dry. Clean up any water that spills on the floor as soon as it happens. Remove soap buildup in the tub or shower regularly. Attach bath mats securely with double-sided  non-slip rug tape. Do not have throw rugs and other things on the floor that can make you trip. What can I do in the bedroom? Use night lights. Make sure that you have a light by your bed that is easy to reach. Do not use any sheets or blankets that are too big for your bed. They should not hang down onto the floor. Have a firm chair that has side arms. You can use this for support while you get dressed. Do not have throw rugs and other things on the floor that can make you trip. What can I do in the kitchen? Clean up any spills right away. Avoid walking on wet floors. Keep items that you use a lot in easy-to-reach places. If you need to reach something above you, use a strong step stool that has a grab bar. Keep electrical cords out of the way. Do not use floor polish or wax that makes floors slippery. If you must use wax, use non-skid floor wax. Do not have throw rugs and other things on the floor that can make you trip. What can I do with my stairs? Do not leave any items on the stairs. Make sure that there are handrails on both sides of the stairs and use them. Fix handrails that are broken or loose. Make sure that handrails are as long as the stairways. Check any carpeting to make sure that it is firmly attached to the stairs. Fix any carpet that is loose or worn. Avoid  having throw rugs at the top or bottom of the stairs. If you do have throw rugs, attach them to the floor with carpet tape. Make sure that you have a light switch at the top of the stairs and the bottom of the stairs. If you do not have them, ask someone to add them for you. What else can I do to help prevent falls? Wear shoes that: Do not have high heels. Have rubber bottoms. Are comfortable and fit you well. Are closed at the toe. Do not wear sandals. If you use a stepladder: Make sure that it is fully opened. Do not climb a closed stepladder. Make sure that both sides of the stepladder are locked into place. Ask  someone to hold it for you, if possible. Clearly mark and make sure that you can see: Any grab bars or handrails. First and last steps. Where the edge of each step is. Use tools that help you move around (mobility aids) if they are needed. These include: Canes. Walkers. Scooters. Crutches. Turn on the lights when you go into a dark area. Replace any light bulbs as soon as they burn out. Set up your furniture so you have a clear path. Avoid moving your furniture around. If any of your floors are uneven, fix them. If there are any pets around you, be aware of where they are. Review your medicines with your doctor. Some medicines can make you feel dizzy. This can increase your chance of falling. Ask your doctor what other things that you can do to help prevent falls. This information is not intended to replace advice given to you by your health care provider. Make sure you discuss any questions you have with your health care provider. Document Released: 02/09/2009 Document Revised: 09/21/2015 Document Reviewed: 05/20/2014 Elsevier Interactive Patient Education  2017 [employer].

## 2021-05-01 NOTE — Progress Notes (Signed)
This visit occurred during the SARS-CoV-2 public health emergency.  Safety protocols were in place, including screening questions prior to the visit, additional usage of staff PPE, and extensive cleaning of exam room while observing appropriate contact time as indicated for disinfecting solutions.  Subjective:   Emma Schneider is a 71 y.o. female who presents for Medicare Annual (Subsequent) preventive examination.  Review of Systems     Cardiac Risk Factors include: advanced age (>53men, >65 women)     Objective:    Today's Vitals   05/01/21 0952  BP: 118/62  Pulse: (!) 102  Temp: 97.9 F (36.6 C)  TempSrc: Oral  SpO2: 99%  Weight: 93 lb 4.8 oz (42.3 kg)  Height: 5\' 4"  (1.626 m)   Body mass index is 16.01 kg/m.  Advanced Directives 05/01/2021 04/18/2020  Does Patient Have a Medical Advance Directive? Yes Yes  Type of Paramedic of [place];Living will Living will;Healthcare Power of [place] in Chart? No - copy requested No - copy requested    Current Medications (verified) Outpatient Encounter Medications as of 05/01/2021  Medication Sig   atorvastatin (LIPITOR) 10 MG tablet Take 1 tablet by mouth once daily   calcium carbonate (OS-CAL) 600 MG TABS tablet Take 1,200 mg by mouth daily.   Cholecalciferol (VITAMIN D3) 25 MCG (1000 UT) CAPS 1 capsule   Cholecalciferol 3000 units TABS Take 3,000 Units by mouth daily.   ferrous sulfate 324 MG TBEC Take 324 mg by mouth.   levothyroxine (SYNTHROID) 25 MCG tablet TAKE 1 TABLET BY MOUTH ONCE DAILY BEFORE BREAKFAST   mirtazapine (REMERON) 7.5 MG tablet Take 1 tablet (7.5 mg total) by mouth at bedtime.   Zoledronic Acid (ZOMETA) 4 MG/100ML IVPB 100 ml over 15 minutes   Ferrous Sulfate Dried (FEOSOL) 200 (65 Fe) MG TABS See admin instructions. (Patient not taking: Reported on 05/01/2021)   No facility-administered encounter medications on file as of 05/01/2021.     Allergies (verified) Wellbutrin [bupropion]   History: 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[place])    Aortic atherosclerosis ([place])    Blood in urine    Chicken pox    Hyperlipidemia    Leukemia ([place])    Vitamin D deficiency    History reviewed. No pertinent surgical history. Family History  Problem Relation Age of Onset   Congestive Heart Failure Mother    Heart attack Father    Heart disease Father    Social History   Socioeconomic History   Marital status: Married    Spouse name: Not on file   Number of children: Not on file   Years of education: Not on file   Highest education level: Not on file  Occupational History   Occupation: retired  Tobacco Use   Smoking status: Every Day    Packs/day: 1.00    Years: 42.00    Pack years: 42.00    Types: Cigarettes   Smokeless tobacco: Never   Tobacco comments:    Is actively trying to cut down number of cigarettes, but not ready to set a quit date.  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    -  Married for 30 years    - Has step children, all live in [place]   - No pets   - Plays games on the computer and enjoys yard work.    Social Determinants of Health   Financial Resource Strain: Low Risk    Difficulty of Paying Living Expenses: Not hard at all  Food Insecurity: No Food Insecurity   Worried About Charity fundraiser in the Last Year: Never true   [place] in the Last Year: Never true  Transportation Needs: No Transportation Needs   Lack of Transportation (Medical): No   Lack of Transportation (Non-Medical): No  Physical Activity: Inactive   Days of Exercise per Week: 0 days   Minutes of Exercise per Session: 0 min  Stress: Stress Concern  Present   Feeling of Stress : To some extent  Social Connections: Not on file    Tobacco Counseling Ready to quit: Not Answered Counseling given: Not Answered Tobacco comments: Is actively trying to cut down number of cigarettes, but not ready to set a quit date.   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 no  Interpreter Needed?: No  Information entered by :: NAllen LPN   Activities of Daily Living In your present state of health, do you have any difficulty performing the following activities: 05/01/2021  Hearing? N  Vision? N  Difficulty concentrating or making decisions? Y  Comment short term memory  Walking or climbing stairs? N  Dressing or bathing? N  Doing errands, shopping? N  Preparing Food and eating ? N  Using the Toilet? N  In the past six months, have you accidently leaked urine? N  Do you have problems with loss of bowel control? N  Managing your Medications? N  Managing your Finances? N  Housekeeping or managing your Housekeeping? N  Some recent data might be hidden    Patient Care Team: Dorothyann Peng, NP as PCP - General (Family Medicine) Irine Seal, MD as Attending Physician (Urology) Sabas Sous, MD as Referring Physician (Internal Medicine) Preminger, Adrienne Mocha, MD as Referring Physician (Urology) Allyn Kenner, DO as Consulting Physician (Obstetrics and Gynecology)  Indicate any recent Medical Services you may have received from other than [facility] providers in the past year (date may be approximate).     Assessment:   This is a routine wellness examination for Emma Schneider.  Hearing/Vision  screen Vision Screening - Comments:: Regular eye exams, [facility]  Dietary issues and exercise activities discussed: Current Exercise Habits: The patient does not participate in regular exercise at present   Goals Addressed             This Visit's Progress    Patient Stated       05/01/2021, no goals       Depression Screen PHQ 2/9 Scores 05/01/2021 04/18/2020 02/10/2020 10/02/2016 01/06/2015 09/20/2014  PHQ - 2 Score 0 0 0 0 0 0    Fall Risk Fall Risk  05/01/2021 04/18/2020 02/10/2020 10/02/2016 01/06/2015  Falls in the past year? 0 0 0 No No  Number falls in past yr: - 0 0 - -  Injury  with Fall? - 0 - - -  Risk for fall due to : Medication side effect Impaired vision - - -  Follow up Falls evaluation completed;Education provided;Falls prevention discussed Falls prevention discussed - - -    FALL RISK PREVENTION PERTAINING TO THE HOME:  Any stairs in or around the home? Yes  If so, are there any without handrails? No  Home free of loose throw rugs in walkways, pet beds, electrical cords, etc? Yes  Adequate lighting in your home to reduce risk of falls? Yes   ASSISTIVE DEVICES UTILIZED TO PREVENT FALLS:  Life alert? No  Use of a cane, walker or w/c? No  Grab bars in the bathroom? Yes  Shower chair or bench in shower? No  Elevated toilet seat or a handicapped toilet? No   TIMED UP AND GO:  Was the test performed? No .    Gait steady and fast without use of assistive device  Cognitive Function:     6CIT Screen 05/01/2021 04/18/2020  What Year? 0 points 0 points  What month? 0 points 0 points  What time? 0 points -  Count back from 20 0 points 0 points  Months in reverse 2 points 4 points  Repeat phrase 6 points 2 points  Total Score 8 -    Immunizations Immunization History  Administered Date(s) Administered   Fluad Quad(high Dose 65+) 01/19/2019, 01/13/2020, 02/13/2021   Hepatitis B 06/27/2008   Influenza Inj Mdck Quad With Preservative 01/27/2018,  02/28/2019   Influenza, High Dose Seasonal PF 02/10/2015, 03/20/2016, 01/21/2017, 02/09/2018   Influenza-Unspecified 02/28/2016, 12/28/2016   PFIZER(Purple Top)SARS-COV-2 Vaccination 06/05/2019, 06/26/2019, 01/26/2020   Pneumococcal Conjugate-13 02/10/2015   Pneumococcal Polysaccharide-23 03/20/2016   Pneumococcal-Unspecified 02/28/2016   Tdap 09/20/2014   Zoster, Live 01/03/2015    TDAP status: Up to date  Flu Vaccine status: Up to date  Pneumococcal vaccine status: Up to date  Covid-19 vaccine status: Completed vaccines  Qualifies for Shingles Vaccine? Yes   Zostavax completed Yes   Shingrix Completed?: No.    Education has been provided regarding the importance of this vaccine. Patient has been advised to call insurance company to determine out of pocket expense if they have not yet received this vaccine. Advised may also receive vaccine at local pharmacy or Health Dept. Verbalized acceptance and understanding.  Screening Tests Health Maintenance  Topic Date Due   Zoster Vaccines- Shingrix (1 of 2) Never done   COVID-19 Vaccine (4 - Booster for Pfizer series) 03/22/2020   MAMMOGRAM  05/10/2021   TETANUS/TDAP  09/19/2024   COLONOSCOPY (Pts 45-36yrs Insurance coverage will need to be confirmed)  10/26/2026   Pneumonia Vaccine 37+ Years old  Completed   INFLUENZA VACCINE  Completed   DEXA SCAN  Completed   Hepatitis C Screening  Completed   HPV VACCINES  Aged Out    Health Maintenance  Health Maintenance Due  Topic Date Due   Zoster Vaccines- Shingrix (1 of 2) Never done   COVID-19 Vaccine (4 - Booster for Pfizer series) 03/22/2020    Colorectal cancer screening: Type of screening: Colonoscopy. Completed 02/17/2020. Repeat every 10 years  Mammogram status: Completed 05/11/2019. Repeat every year  Bone Density status: Completed 10/27/2020. Results reflect: Bone density results: OSTEOPOROSIS. Repeat every 2 years.  Lung Cancer Screening: (Low Dose CT Chest recommended  if Age 9-80 years, 30 pack-year currently smoking OR have quit w/in 15years.) does qualify.   Lung Cancer Screening Referral: CT Scan 06/27/2020  Additional Screening:  Hepatitis  C Screening: does qualify; Completed 01/06/2015  Vision Screening: Recommended annual ophthalmology exams for early detection of glaucoma and other disorders of the eye. Is the patient up to date with their annual eye exam?  Yes  Who is the provider or what is the name of the office in which the patient attends annual eye exams? [facility] If pt is not established with a provider, would they like to be referred to a provider to establish care? No .   Dental Screening: Recommended annual dental exams for proper oral hygiene  Community Resource Referral / Chronic Care Management: CRR required this visit?  No   CCM required this visit?  No      Plan:     I have personally reviewed and noted the following in the patients chart:   Medical and social history Use of alcohol, tobacco or illicit drugs  Current medications and supplements including opioid prescriptions.  Functional ability and status Nutritional status Physical activity Advanced directives List of other physicians Hospitalizations, surgeries, and ER visits in previous 12 months Vitals Screenings to include cognitive, depression, and falls Referrals and appointments  In addition, I have reviewed and discussed with patient certain preventive protocols, quality metrics, and best practice recommendations. A written personalized care plan for preventive services as well as general preventive health recommendations were provided to patient.     Kellie Simmering, LPN   [id number]   Nurse Notes: none

## 2021-05-08 ENCOUNTER — Other Ambulatory Visit: Payer: Self-pay

## 2021-05-08 ENCOUNTER — Other Ambulatory Visit: Payer: Self-pay | Admitting: Adult Health

## 2021-05-08 ENCOUNTER — Telehealth: Payer: Self-pay | Admitting: Adult Health

## 2021-05-08 DIAGNOSIS — R634 Abnormal weight loss: Secondary | ICD-10-CM

## 2021-05-08 DIAGNOSIS — F419 Anxiety disorder, unspecified: Secondary | ICD-10-CM

## 2021-05-08 MED ORDER — MIRTAZAPINE 7.5 MG PO TABS: 7.5000 mg | ORAL_TABLET | Freq: Every day | ORAL | 0 refills | Status: DC

## 2021-05-08 NOTE — Telephone Encounter (Signed)
Rx refilled ectronically

## 2021-05-08 NOTE — Telephone Encounter (Signed)
Patient called to get refill on mirtazapine (REMERON) 7.5 MG tablet      Please send to    [place], [address] Phone:  [phone]  Fax:  [phone]       Please advise

## 2021-05-14 DIAGNOSIS — M810 Age-related osteoporosis without current pathological fracture: Secondary | ICD-10-CM | POA: Diagnosis not present

## 2021-05-14 DIAGNOSIS — Z8639 Personal history of other endocrine, nutritional and metabolic disease: Secondary | ICD-10-CM | POA: Diagnosis not present

## 2021-05-14 DIAGNOSIS — Z681 Body mass index (BMI) 19 or less, adult: Secondary | ICD-10-CM | POA: Diagnosis not present

## 2021-05-28 ENCOUNTER — Ambulatory Visit
Admission: RE | Admit: 2021-05-28 | Discharge: 2021-05-28 | Disposition: A | Discharge status: Home / Self Care | Payer: Medicare PPO | Source: Ambulatory Visit | Attending: Adult Health | Admitting: Adult Health

## 2021-05-28 ENCOUNTER — Other Ambulatory Visit: Payer: Self-pay | Admitting: Adult Health

## 2021-05-28 ENCOUNTER — Other Ambulatory Visit: Payer: Self-pay

## 2021-05-28 DIAGNOSIS — R4189 Other symptoms and signs involving cognitive functions and awareness: Secondary | ICD-10-CM

## 2021-05-28 DIAGNOSIS — E039 Hypothyroidism, unspecified: Secondary | ICD-10-CM

## 2021-05-28 DIAGNOSIS — G319 Degenerative disease of nervous system, unspecified: Secondary | ICD-10-CM | POA: Diagnosis not present

## 2021-05-28 DIAGNOSIS — F22 Delusional disorders: Secondary | ICD-10-CM | POA: Diagnosis not present

## 2021-05-28 DIAGNOSIS — R413 Other amnesia: Secondary | ICD-10-CM | POA: Diagnosis not present

## 2021-05-30 ENCOUNTER — Telehealth: Payer: Self-pay | Admitting: Adult Health

## 2021-05-30 DIAGNOSIS — F419 Anxiety disorder, unspecified: Secondary | ICD-10-CM

## 2021-05-30 DIAGNOSIS — R634 Abnormal weight loss: Secondary | ICD-10-CM

## 2021-05-30 MED ORDER — DONEPEZIL HCL 5 MG PO TABS: 5.0000 mg | ORAL_TABLET | Freq: Every day | ORAL | 1 refills | Status: DC

## 2021-05-30 MED ORDER — MIRTAZAPINE 15 MG PO TABS: 15.0000 mg | ORAL_TABLET | Freq: Every day | ORAL | 0 refills | Status: DC

## 2021-05-30 NOTE — Telephone Encounter (Signed)
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  Her husband reports that the patient is not sleeping well., her short term memory is " shot". She cannot focus on movies, she becomes upset at friends and family.   Has an appointment with Neuropsych in July   Will increase Remeron to 15 mg and start on Aricept 5 mg. Family will follow up in 2 months or sooner if needed

## 2021-06-05 DIAGNOSIS — Z01419 Encounter for gynecological examination (general) (routine) without abnormal findings: Secondary | ICD-10-CM | POA: Diagnosis not present

## 2021-06-05 DIAGNOSIS — Z1231 Encounter for screening mammogram for malignant neoplasm of breast: Secondary | ICD-10-CM | POA: Diagnosis not present

## 2021-06-06 ENCOUNTER — Encounter: Payer: Self-pay | Admitting: Adult Health

## 2021-07-28 ENCOUNTER — Other Ambulatory Visit: Payer: Self-pay | Admitting: *Deleted

## 2021-07-28 DIAGNOSIS — Z87891 Personal history of nicotine dependence: Secondary | ICD-10-CM

## 2021-07-28 DIAGNOSIS — F17210 Nicotine dependence, cigarettes, uncomplicated: Secondary | ICD-10-CM

## 2021-08-01 ENCOUNTER — Ambulatory Visit (INDEPENDENT_AMBULATORY_CARE_PROVIDER_SITE_OTHER)
Admission: RE | Admit: 2021-08-01 | Discharge: 2021-08-01 | Disposition: A | Discharge status: Home / Self Care | Payer: Medicare PPO | Source: Ambulatory Visit | Attending: Acute Care | Admitting: Acute Care

## 2021-08-01 DIAGNOSIS — F17210 Nicotine dependence, cigarettes, uncomplicated: Secondary | ICD-10-CM

## 2021-08-01 DIAGNOSIS — Z87891 Personal history of nicotine dependence: Secondary | ICD-10-CM | POA: Diagnosis not present

## 2021-08-08 ENCOUNTER — Telehealth: Payer: Self-pay | Admitting: Acute Care

## 2021-08-08 NOTE — Telephone Encounter (Signed)
I have called the patient with the results of her low-dose CT.  I explained that her scan was read as a lung RADS 4A, suspicious.There is a new solid pulmonary nodule of the left lower lobe measuring 6.6 mm, potentially sequela of infection or aspiration . Per the patient she has been sick with chest congestion and allergies. She has not had fever of shortness of breath. I did ask if she gets choked on her food, and she stated that she does not.  ?She is in agreement with 3 month follow up low dose CT Chest.  ?Langley Gauss, please place order for follow up Low Dose CT Chest in 3 months, fax PCP with results, and let him know plan is for a 3 month follow up scan to re-evaluate area of concern. Thanks so much. ?

## 2021-08-09 ENCOUNTER — Other Ambulatory Visit: Payer: Self-pay

## 2021-08-09 DIAGNOSIS — Z87891 Personal history of nicotine dependence: Secondary | ICD-10-CM

## 2021-08-09 DIAGNOSIS — R911 Solitary pulmonary nodule: Secondary | ICD-10-CM

## 2021-08-09 DIAGNOSIS — Z122 Encounter for screening for malignant neoplasm of respiratory organs: Secondary | ICD-10-CM

## 2021-08-09 NOTE — Telephone Encounter (Signed)
Results/plan faxed to PCP and order placed for 3 month follow up CT chest ?

## 2021-08-26 ENCOUNTER — Other Ambulatory Visit: Payer: Self-pay | Admitting: Adult Health

## 2021-08-26 DIAGNOSIS — E039 Hypothyroidism, unspecified: Secondary | ICD-10-CM

## 2021-09-02 ENCOUNTER — Other Ambulatory Visit: Payer: Self-pay | Admitting: Adult Health

## 2021-09-02 DIAGNOSIS — R634 Abnormal weight loss: Secondary | ICD-10-CM

## 2021-09-02 DIAGNOSIS — F419 Anxiety disorder, unspecified: Secondary | ICD-10-CM

## 2021-10-31 DIAGNOSIS — H2513 Age-related nuclear cataract, bilateral: Secondary | ICD-10-CM | POA: Diagnosis not present

## 2021-10-31 DIAGNOSIS — H353111 Nonexudative age-related macular degeneration, right eye, early dry stage: Secondary | ICD-10-CM | POA: Diagnosis not present

## 2021-10-31 DIAGNOSIS — H52203 Unspecified astigmatism, bilateral: Secondary | ICD-10-CM | POA: Diagnosis not present

## 2021-10-31 DIAGNOSIS — H02831 Dermatochalasis of right upper eyelid: Secondary | ICD-10-CM | POA: Diagnosis not present

## 2021-11-06 ENCOUNTER — Ambulatory Visit: Payer: Medicare PPO | Admitting: Psychology

## 2021-11-06 ENCOUNTER — Encounter: Payer: Self-pay | Admitting: Psychology

## 2021-11-06 DIAGNOSIS — F0280 Dementia in other diseases classified elsewhere without behavioral disturbance: Secondary | ICD-10-CM

## 2021-11-06 DIAGNOSIS — R4189 Other symptoms and signs involving cognitive functions and awareness: Secondary | ICD-10-CM

## 2021-11-06 DIAGNOSIS — I700 Atherosclerosis of aorta: Secondary | ICD-10-CM | POA: Insufficient documentation

## 2021-11-06 DIAGNOSIS — G309 Alzheimer's disease, unspecified: Secondary | ICD-10-CM

## 2021-11-06 DIAGNOSIS — M8580 Other specified disorders of bone density and structure, unspecified site: Secondary | ICD-10-CM | POA: Insufficient documentation

## 2021-11-06 DIAGNOSIS — E785 Hyperlipidemia, unspecified: Secondary | ICD-10-CM | POA: Insufficient documentation

## 2021-11-06 DIAGNOSIS — E559 Vitamin D deficiency, unspecified: Secondary | ICD-10-CM | POA: Insufficient documentation

## 2021-11-06 HISTORY — DX: Dementia in other diseases classified elsewhere, unspecified severity, without behavioral disturbance, psychotic disturbance, mood disturbance, and anxiety: F02.80

## 2021-11-06 NOTE — Progress Notes (Unsigned)
NEUROPSYCHOLOGICAL EVALUATION Emma Schneider. [facility] Department of Neurology  Date of Evaluation: November 06, 2021  Reason for Referral:   Emma Schneider is a 71 y.o. right-handed Caucasian female referred by  Emma Peng, NP , to characterize her current cognitive functioning and assist with diagnostic clarity and treatment planning in the context of subjective cognitive decline.   Assessment and Plan:   Clinical Impression(s): Emma Schneider pattern of performance is suggestive of primary impairments surrounding semantic fluency, confrontation naming, and essentially all aspects of learning and memory. Additional weaknesses were exhibited across complex attention, receptive language, and visuospatial abilities, while variability was exhibited across processing speed, basic attention, and executive functioning. Performances were appropriate across safety/judgment and phonemic fluency. Regarding ADLs, Mr. Ney husband has fully taken over medication management, financial management, and bill paying responsibilities. He also does not allow her to drive alone and has greatly reduced driving to nearby locations. Given evidence for both significant cognitive and functional impairment, Emma Schneider meets criteria for a Major Neurocognitive Disorder ("dementia") at the present time.  Regarding etiology, I unfortunately have concerns surrounding underlying Alzheimer's disease. Ms. Bour did not benefit from repeated exposure to information across learning trials, performed poorly across delayed recall trials, and performed poorly across yes/no recognition trials. Taken together, this suggests evidence for rapid forgetting and an evolving and already quite significant memory storage impairment, both of which are hallmark characteristics of this illness. Additional impairments across confrontation naming and semantic fluency align very well with this illness as they are the  expected domains to exhibit impairment following early memory dysfunction. Visuospatial weakness also aligns well with typical disease progression. While I am certainly not able to ascertain the validity of her allegations surrounding her husband's behaviors, both her husband and their daughter denied these actions and the emergence of suspicion and delusional thinking is certainly seen in individuals with Alzheimer's disease.   There could be a vascular contribution given that recent neuroimaging revealed mild to moderate microvascular ischemic disease. However, this would certainly not cause significant memory impairment or patterns that align reasonably well with Alzheimer's disease by itself. Hallucinations appear to be related to Wellbutrin side effects and testing patterns are not more suggestive of Lewy body disease relative to Alzheimer's disease. I do not see compelling evidence for frontotemporal dementia, Parkinson's disease, or another more rare parkinsonian presentation. Continued medical monitoring will be important moving forward.  Recommendations: It does not seem that Emma Schneider is currently being followed by a neurologist. As such, I will place a referral for her to assist with ongoing neurological care.   When meeting with her neurologist, I would recommend that she discuss medications aimed to address memory loss and concerns surrounding Alzheimer's disease. It is important to highlight that these medications have been shown to slow functional decline in some individuals. There is no current treatment which can stop or reverse cognitive decline when caused by a neurodegenerative illness.   Performance across neurocognitive testing is admittedly not a strong predictor of an individual's safety operating a motor vehicle. However, given fairly widespread impairment across testing, I agree with her husband's decisions to greatly limit her driving. I would actually recommend that she  abstain from all driving pursuits. Should her family wish to pursue a formalized driving evaluation, they could reach out to the following agencies: [employer] in [place]: [phone] [facility]: [facility]: [place]: [phone] or [phone]  It will be important for  her to have another person with her when in situations where she may need to process information, weigh the pros and cons of different options, and make decisions, in order to ensure that she fully understands and recalls all information to be considered.  If not already done, Emma Schneider and her family may want to discuss her wishes regarding durable power of attorney and medical decision making, so that she can have input into these choices. Additionally, they may wish to discuss future plans for caretaking and seek out community options for in home/residential care should they become necessary.  Emma Schneider is encouraged to attend to lifestyle factors for brain health (e.g., regular physical exercise, good nutrition habits, regular participation in cognitively-stimulating activities, and general stress management techniques), which are likely to have benefits for both emotional adjustment and cognition. Optimal control of vascular risk factors (including safe cardiovascular exercise and adherence to dietary recommendations) is encouraged. Continued participation in activities which provide mental stimulation and social interaction is also recommended.   Important information should be provided to Emma Schneider in written format in all instances. This information should be placed in a highly frequented and easily visible location within her home to promote recall. External strategies such as written notes in a consistently used memory journal, visual and nonverbal auditory cues such as a calendar on the refrigerator or appointments with alarm, such as  on a cell phone, can also help maximize recall.  Review of Records:   Emma Schneider was seen at her PCP's office Emma Peng, NP) on 02/13/2021 for her annual wellness exam. Within this note, there is mention that, starting in May 2022, she reported feeling on edge and anxious stemming from her perception that her husband has had several ongoing affairs. Wellbutrin was prescribed in response. She was seen for a 56-monthfollow-up on 03/16/2021 and reported feeling more calm and less on edge. Side effects were denied at that time and the medication was continued.  She was seen at her PCP's office (Alysia Penna M.D.) on 04/02/2021 due to the emergence of visual hallucinations. For the prior four days, she described seeing "dogs and cats" under chairs on her porch. She also reported seeing dogs roaming through her house. She believes these visions to be true even when family members talk to her about them. Lab work in October was normal. Hallucinations were thought to be a side effect of Wellbutrin and this medication was discontinued.   She met with Mr. NCarlisle Caterfor follow-up on 04/10/2021 and reported a complete resolution of visual hallucinations. During this appointment, Ms. Aber's husband reported cognitive decline, particularly surrounding short-term memory. He noted that she is unable to perform tasks on a daily basis and seems to have greater difficulty with reading comprehension. Both her husband and their daughter noted that her report of affairs are untrue. Ms. HChamcontinued to believe these occurrences, stating that her husband will talk and profess his actions in his sleep. Ultimately, Ms. Zachow was referred for a comprehensive neuropsychological evaluation to characterize her cognitive abilities and to assist with diagnostic clarity and treatment planning.   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 Will repeat dexa this year and Rx risidronate in the mean time. Asked pt to find out from PCP if they had prescribed Actonel prior so we can determine how many year   Personal history of colonic polyps 09/27/2016   3 polyps removed, repeated 02/2020, due 3 yrs   Thyroid dysfunction 05/11/2019   dx 02/2019, started on Euthyrox   Tobacco use 01/06/2015   Vitamin D deficiency     No past surgical history on file.   Current Outpatient Medications:    atorvastatin (LIPITOR) 10 MG tablet, Take 1 tablet by mouth once daily, Disp: 90 tablet, Rfl: 0   calcium carbonate (OS-CAL) 600 MG TABS tablet, Take 1,200 mg by mouth daily., Disp: , Rfl:    Cholecalciferol (VITAMIN D3) 25 MCG (1000 UT) CAPS, 1 capsule, Disp: , Rfl:    Cholecalciferol 3000 units TABS, Take 3,000 Units by mouth daily., Disp: 90 tablet, Rfl: 3   donepezil (ARICEPT) 5 MG tablet, Take 1 tablet (5 mg total) by mouth at bedtime., Disp: 90 tablet, Rfl: 1   ferrous sulfate 324 MG TBEC, Take 324 mg by mouth., Disp: , Rfl:    Ferrous Sulfate Dried (FEOSOL) 200 (65 Fe) MG TABS, See admin instructions. (Patient not taking: Reported on 05/01/2021), Disp: , Rfl:    levothyroxine (SYNTHROID) 25 MCG tablet, TAKE 1 TABLET BY MOUTH ONCE DAILY BEFORE BREAKFAST, Disp: 90 tablet, Rfl: 0    mirtazapine (REMERON) 15 MG tablet, TAKE 1 TABLET BY MOUTH AT BEDTIME, Disp: 90 tablet, Rfl: 0   Zoledronic Acid (ZOMETA) 4 MG/100ML IVPB, 100 ml over 15 minutes, Disp: , Rfl:   Clinical Interview:   The following information was obtained during a clinical interview with Ms. Ginsberg and her husband prior to cognitive testing.  Cognitive Symptoms: Decreased short-term memory: Endorsed. However, Ms. Dart generally minimized concerns, acknowledging some trouble recalling names and more mild difficulties recalling past conversations. Her husband reported far greater memory concerns, stating that she seems to forget information quite quickly and will repeat herself or ask repetitive questions. Per his report, difficulties have been present and gradually worsening for the past couple of years.  Decreased long-term memory: Denied. Her husband reported trouble with long-term memory. However, provided examples surrounded details such as her brother's age rather than events and experiences from earlier in her life.  Decreased attention/concentration: Denied. Her husband reported ongoing difficulty with sustained attention and increased distractibility.  Reduced processing speed: Denied. Her husband reported some diminished processing speed.  Difficulties with executive functions: Endorsed. She reported emerging difficulties with multi-tasking, organization, and impulsivity. Personality changes, in that she seems to have a short-fuse and can become agitated and irritable quite quickly, were also reported by both she and her husband.  Difficulties with emotion regulation: Denied outside what is described above.  Difficulties with receptive language: Denied. Difficulties with word finding: Denied. Decreased visuoperceptual ability: Denied.  Difficulties completing ADLs: Endorsed. Her husband has fully taken over medication management, financial management, and bill paying responsibilities. He noted that he  will allow her the opportunity to organize  her pillbox at the beginning of each week; however, he always has to correct and intervene. Her husband does not allow her to drive alone and has restricted driving behaviors to nearby and familiar locations away from busy roads or interchanges.   Additional Medical History: History of traumatic brain injury/concussion: Denied. History of stroke: Denied. History of seizure activity: Denied. History of known exposure to toxins: Denied. Symptoms of chronic pain: Denied. Experience of frequent headaches/migraines: Endorsed. She reported headache symptoms increasing in both severity and frequency lately. She described pain starting on the top of her head and radiating down. Her husband noted that these headaches seem to be triggered by stress.  Frequent instances of dizziness/vertigo: Denied.  Sensory changes: She wears glasses and hearing aids with benefit. Other sensory changes/difficulties (e.g., taste or smell) were denied.  Balance/coordination difficulties: Endorsed. She reported ongoing instability with one side of the body not noticeably different from the other. She reported prior falls but none occurring recently.  Other motor difficulties: Denied.  Other medical conditions: She underwent chemotherapy to treat leukemia which was completed in June 2001.   Sleep History: Estimated hours obtained each night: 6-8 hours.  Difficulties falling asleep: Denied. Difficulties staying asleep: Endorsed. Feels rested and refreshed upon awakening: Endorsed.  History of snoring: Endorsed. Symptoms were said to be mild. History of waking up gasping for air: Denied. Witnessed breath cessation while asleep: Denied.  History of vivid dreaming: Denied. Excessive movement while asleep: Denied. Instances of acting out her dreams: Denied.  Psychiatric/Behavioral Health History: Depression: She described her current mood as "pretty good" and denied to her  knowledge any prior mental health concerns or diagnoses. Despite this, there is documentation in her medical records to suggest that she had expressed depressed mood to her PCP in the past. Current or remote suicidal ideation, intent, or plan was denied.  Anxiety: Denied. However, there is documentation in her medical records to suggest that she had expressed ongoing anxiety to her PCP in the past. Per her husband, this is what prompted her being prescribed Wellbutrin, which in turn created visual hallucinations.  Mania: Denied. Trauma History: Denied. Visual/auditory hallucinations: Endorsed (see above). Both she and her husband noted that she has not experienced any hallucinations since discontinuing Wellbutrin. They did not report any prior experiences before starting this medication last October.  Delusional thoughts: Endorsed. Per her husband, Ms. Oloughlin has accused him of having multiple affairs with both men and women, starting this past October. This has created ongoing stress and anxiety for Ms. Wires and she has disallowed the two of them to sleep in the same bed. Her husband denied all of what she has described, stating that she claims to have heard him profess these actions while asleep. Attempted recordings to capture him talking while asleep have not yielded any evidence. As her husband was describing this situation, Ms. Helle interjected to claim the truth of her beliefs. However, minutes later, she stated she no longer believed this.   Tobacco: Endorsed. She smokes about one pack of cigarettes daily.  Alcohol: She denied current alcohol consumption as well as a history of problematic alcohol abuse or dependence.  Recreational drugs: Denied.  Family History: Problem Relation Age of Onset   Congestive Heart Failure Mother    Dementia Mother        late in life   Heart attack Father    Heart disease Father    This information was confirmed by Ms.  Marlowe.  Academic/Vocational History: Highest  level of educational attainment: 13 years. 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 She described herself as an average (B/C, few D) student in academic settings. History was noted as a likely relative weakness.  History of developmental delay: Denied. History of grade repetition: Denied. Enrollment in special education courses: Denied. History of LD/ADHD: Denied.  Employment: Retired. 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   Evaluation Results:   Behavioral Observations: Ms. Bawa was accompanied by her husband, arrived to her appointment on time, and was appropriately dressed and groomed. She appeared alert. Observed gait and station were within normal limits. Gross motor functioning appeared intact upon informal observation and no abnormal movements (e.g., tremors) were noted. Her affect ranged given the subject being discussed during the clinical interview. At the end of the interview and prior to the start of testing, she became tearful, expressing anxiety surrounding upcoming testing procedures. Spontaneous speech was fluent and word finding difficulties were not observed during the clinical interview. Thought processes were coherent, organized, and normal in content. Insight into her cognitive difficulties appeared poor as she largely denied or minimized cognitive concerns despite objective testing revealing significant impairment across several domains.   During testing, she was noted to be tearful at times. She made several statements surrounding tasks making her feel "stupid" and that she was "going to fail." Sustained attention was adequate. Task engagement was adequate and she persisted when challenged. Overall, Ms. Ellery was cooperative with the clinical interview and subsequent testing procedures.   Adequacy of Effort: The validity of  neuropsychological testing is limited by the extent to which the individual being tested may be assumed to have exerted adequate effort during testing. Ms. Sykora expressed her intention to perform to the best of her abilities and exhibited adequate task engagement and persistence. Scores across stand-alone and embedded performance validity measures were variable but often below expectation. This most likely reflects true and significant ongoing cognitive impairment as opposed to Ms. Hamblin not engaging in testing or purposefully performing poorly. Some of this could also be caused by acute testing anxiety. Overall, I feel that the results of the current evaluation are a generally valid representation of Ms. Alabi's current cognitive functioning.  Test Results: Ms. Pieri was somewhat disoriented at the time of the current evaluation. She incorrectly stated the current year ("2001") and date. She was also unable to name the current clinic; however, she was able to identify the main road it is adjacent to.   Intellectual abilities based upon educational and vocational attainment were estimated to be in the below average to average range. Premorbid abilities were estimated to be within the below average range based upon a single-word reading test.   Processing speed was variable, ranging from the exceptionally low to average normative ranges. Basic attention was variable, ranging from the well below average to average normative ranges. More complex attention (e.g., working memory) was well below average. Executive functioning was also variable, ranging from the exceptionally low to average normative ranges. She did perform in the average range across a task assessing safety and judgment.  Assessed receptive language abilities were well below average. However, Ms. Chiaramonte only missed a few items across this task and did not exhibit any difficulties comprehending task instructions. She also and  answered questions asked of her adequately during interview. Assessed expressive language was mildly variable. Phonemic fluency was below average to average, semantic fluency  was exceptionally low, and confrontation naming was well below average.    Assessed visuospatial/visuoconstructional abilities were exceptionally low to below average. Points were lost on her drawing of a clock due to mild spatial abnormalities in numerical placement, as well as confusion and her being unable to attempt to place clock hands. Her copy of a complex figure was appropriate.    Learning (i.e., encoding) of novel verbal information was exceptionally low. Spontaneous delayed recall (i.e., retrieval) of previously learned information was below average across a list learning task but exceptionally low across story and figure tasks. Performance across recognition tasks was below average across a story task but exceptionally low to well below average across list and figure tasks, suggesting very limited evidence for information consolidation.   Results of emotional screening instruments suggested that recent symptoms of generalized anxiety were in the minimal range, while symptoms of depression were within normal limits. A screening instrument assessing recent sleep quality suggested the presence of minimal sleep dysfunction.  Tables of Scores:   Note: This summary of test scores accompanies the interpretive report and should not be considered in isolation without reference to the appropriate sections in the text. Descriptors are based on appropriate normative data and may be adjusted based on clinical judgment. Terms such as "Within Normal Limits" and "Outside Normal Limits" are used when a more specific description of the test score cannot be determined.       Percentile - Normative Descriptor > 98 - Exceptionally High 91-97 - Well Above Average 75-90 - Above Average 25-74 - Average 9-24 - Below Average 2-8 - Well  Below Average < 2 - Exceptionally Low       Validity:   DESCRIPTOR       Dot Counting Test: --- --- Within Normal Limits  RBANS Effort Index: --- --- Outside Normal Limits  WAIS-IV Reliable Digit Span: --- --- Outside Normal Limits       Orientation:      Raw Score Percentile   NAB Orientation, Form 1 24/29 --- ---       Cognitive Screening:      Raw Score Percentile   SLUMS: 15/30 --- ---       RBANS, Form A: Standard Score/ Scaled Score Percentile   Total Score 50 <1 Exceptionally Low  Immediate Memory 44 <1 Exceptionally Low    List Learning 1 <1 Exceptionally Low    Story Memory 2 <1 Exceptionally Low  Visuospatial/Constructional 72 3 Well Below Average    Figure Copy 9 37 Average    Line Orientation 6/20 <2 Exceptionally Low  Language 64 1 Exceptionally Low    Picture Naming 8/10 3-9 Well Below Average    Semantic Fluency 3 1 Exceptionally Low  Attention 64 1 Exceptionally Low    Digit Span 4 2 Well Below Average    Coding 5 5 Well Below Average  Delayed Memory 40 <1 Exceptionally Low    List Recall 2/10 10-16 Below Average    List Recognition 12/20 <2 Exceptionally Low    Story Recall 2 <1 Exceptionally Low    Story Recognition 9/12 16-26 Below Average    Figure Recall 1 <1 Exceptionally Low    Figure Recognition 3/8 4-8 Well Below Average        Intellectual Functioning:      Standard Score Percentile   Test of Premorbid Functioning: 81 10 Below Average       Attention/Executive Function:     Trail Making Test (TMT): Raw Score (  T Score) Percentile     Part A 73 secs.,  0 errors (26) 1 Exceptionally Low    Part B Discontinued --- Impaired         Scaled Score Percentile   WAIS-IV Digit Span: 4 2 Well Below Average    Forward 8 25 Average    Backward 5 5 Well Below Average    Sequencing 4 2 Well Below Average        Scaled Score Percentile   WAIS-IV Similarities: 4 2 Well Below Average       D-KEFS Color-Word Interference Test: Raw Score (Scaled  Score) Percentile     Color Naming 46 secs. (5) 5 Well Below Average    Word Reading 24 secs. (11) 63 Average    Inhibition 73 secs. (10) 50 Average      Total Errors 24 errors (1) <1 Exceptionally Low    Inhibition/Switching 98 secs. (8) 25 Average      Total Errors 14 errors (1) <1 Exceptionally Low       D-KEFS Verbal Fluency Test: Raw Score (Scaled Score) Percentile     Letter Total Correct 32 (9) 37 Average    Category Total Correct 18 (4) 2 Well Below Average    Category Switching Total Correct 8 (5) 5 Well Below Average    Category Switching Accuracy 7 (6) 9 Below Average      Total Set Loss Errors 2 (10) 50 Average      Total Repetition Errors 9 (4) 2 Well Below Average       NAB Executive Functions Module, Form 1: T Score Percentile     Judgment 47 38 Average       Language:      Raw Score Percentile   Sentence Repetition: 9/22 <1 Exceptionally Low       Verbal Fluency Test: Raw Score (T Score) Percentile     Phonemic Fluency (FAS) 32 (42) 21 Below Average    Animal Fluency 10 (26) 1 Exceptionally Low        NAB Language Module, Form 1: T Score Percentile     Auditory Comprehension 36 8 Well Below Average    Naming 27/31 (36) 8 Well Below Average       Visuospatial/Visuoconstruction:      Raw Score Percentile   Clock Drawing: 5/10 --- Impaired        Scaled Score Percentile   WAIS-IV Block Design: 7 16 Below Average       Mood and Personality:      Raw Score Percentile   Beck Depression Inventory - II: 7 --- Within Normal Limits  PROMIS Anxiety Questionnaire: 13 --- None to Slight       Additional Questionnaires:      Raw Score Percentile   PROMIS Sleep Disturbance Questionnaire: 19 --- None to Slight   Informed Consent and Coding/Compliance:   The current evaluation represents a clinical evaluation for the purposes previously outlined by the referral source and is in no way reflective of a forensic evaluation.   Ms. Meixner was provided with a verbal  description of the nature and purpose of the present neuropsychological evaluation. Also reviewed were the foreseeable risks and/or discomforts and benefits of the procedure, limits of confidentiality, and mandatory reporting requirements of this provider. The patient was given the opportunity to ask questions and receive answers about the evaluation. Oral consent to participate was provided by the patient.   This evaluation was conducted by Christia Reading, Ph.D.,  ABPP-CN, board certified clinical neuropsychologist. Ms. Agnes completed a clinical interview with Dr. Melvyn Novas, billed as one unit [phone], and 140 minutes of cognitive testing and scoring, billed as one unit [phone] and four additional units 96139. Psychometrist Milana Kidney, B.S., assisted Dr. Melvyn Novas with test administration and scoring procedures. As a separate and discrete service, Dr. Melvyn Novas spent a total of 160 minutes in interpretation and report writing billed as one unit [phone] and two units 96133.

## 2021-11-06 NOTE — Progress Notes (Signed)
   Psychometrician Note   Cognitive testing was administered to [employer] by Milana Kidney, B.S. (psychometrist) under the supervision of Dr. Christia Reading, Ph.D., licensed psychologist on 11/06/2021. Ms. Scollard did not appear overtly distressed by the testing session per behavioral observation or responses across self-report questionnaires. Rest breaks were offered.    The battery of tests administered was selected by Dr. Christia Reading, Ph.D. with consideration to Ms. Munger's current level of functioning, the nature of her symptoms, emotional and behavioral responses during interview, level of literacy, observed level of motivation/effort, and the nature of the referral question. This battery was communicated to the psychometrist. Communication between Dr. Christia Reading, Ph.D. and the psychometrist was ongoing throughout the evaluation and Dr. Christia Reading, Ph.D. was immediately accessible at all times. Dr. Christia Reading, Ph.D. provided supervision to the psychometrist on the date of this service to the extent necessary to assure the quality of all services provided.    Faizah Kandler Bosak will return within approximately 1-2 weeks for an interactive feedback session with Dr. Melvyn Novas at which time her test performances, clinical impressions, and treatment recommendations will be reviewed in detail. Ms. Brucks understands she can contact our office should she require our assistance before this time.  A total of 140 minutes of billable time were spent face-to-face with Ms. Maestre by the psychometrist. This includes both test administration and scoring time. Billing for these services is reflected in the clinical report generated by Dr. Christia Reading, Ph.D.  This note reflects time spent with the psychometrician and does not include test scores or any clinical interpretations made by Dr. Melvyn Novas. The full report will follow in a separate note.

## 2021-11-07 ENCOUNTER — Encounter: Payer: Self-pay | Admitting: Psychology

## 2021-11-13 ENCOUNTER — Encounter: Payer: Medicare PPO | Admitting: Psychology

## 2021-11-13 DIAGNOSIS — M810 Age-related osteoporosis without current pathological fracture: Secondary | ICD-10-CM | POA: Diagnosis not present

## 2021-11-13 DIAGNOSIS — Z8639 Personal history of other endocrine, nutritional and metabolic disease: Secondary | ICD-10-CM | POA: Diagnosis not present

## 2021-11-14 ENCOUNTER — Ambulatory Visit
Admission: RE | Admit: 2021-11-14 | Discharge: 2021-11-14 | Disposition: A | Discharge status: Home / Self Care | Payer: Medicare PPO | Source: Ambulatory Visit | Attending: Acute Care | Admitting: Acute Care

## 2021-11-14 DIAGNOSIS — I700 Atherosclerosis of aorta: Secondary | ICD-10-CM | POA: Diagnosis not present

## 2021-11-14 DIAGNOSIS — R918 Other nonspecific abnormal finding of lung field: Secondary | ICD-10-CM | POA: Diagnosis not present

## 2021-11-14 DIAGNOSIS — J432 Centrilobular emphysema: Secondary | ICD-10-CM | POA: Diagnosis not present

## 2021-11-14 DIAGNOSIS — J9811 Atelectasis: Secondary | ICD-10-CM | POA: Diagnosis not present

## 2021-11-14 DIAGNOSIS — Z122 Encounter for screening for malignant neoplasm of respiratory organs: Secondary | ICD-10-CM

## 2021-11-14 DIAGNOSIS — Z87891 Personal history of nicotine dependence: Secondary | ICD-10-CM

## 2021-11-14 DIAGNOSIS — R911 Solitary pulmonary nodule: Secondary | ICD-10-CM

## 2021-11-15 DIAGNOSIS — M810 Age-related osteoporosis without current pathological fracture: Secondary | ICD-10-CM | POA: Diagnosis not present

## 2021-11-16 ENCOUNTER — Telehealth: Payer: Self-pay | Admitting: Acute Care

## 2021-11-16 ENCOUNTER — Ambulatory Visit: Payer: Medicare PPO | Admitting: Psychology

## 2021-11-16 DIAGNOSIS — M810 Age-related osteoporosis without current pathological fracture: Secondary | ICD-10-CM | POA: Insufficient documentation

## 2021-11-16 DIAGNOSIS — G309 Alzheimer's disease, unspecified: Secondary | ICD-10-CM

## 2021-11-16 DIAGNOSIS — F0280 Dementia in other diseases classified elsewhere without behavioral disturbance: Secondary | ICD-10-CM

## 2021-11-16 NOTE — Progress Notes (Signed)
   Neuropsychology Feedback Session [name]. [place] Department of Neurology  Reason for Referral:   Emma Schneider is a 71 y.o. right-handed Caucasian female referred by  Dorothyann Peng, NP , to characterize her current cognitive functioning and assist with diagnostic clarity and treatment planning in the context of subjective cognitive decline.   Feedback:   [name] completed a comprehensive neuropsychological evaluation on 11/06/2021. Please refer to that encounter for the full report and recommendations. Briefly, results suggested primary impairments surrounding semantic fluency, confrontation naming, and essentially all aspects of learning and memory. Additional weaknesses were exhibited across complex attention, receptive language, and visuospatial abilities, while variability was exhibited across processing speed, basic attention, and executive functioning. Regarding etiology, I unfortunately have concerns surrounding underlying Alzheimer's disease. [name] did not benefit from repeated exposure to information across learning trials, performed poorly across delayed recall trials, and performed poorly across yes/no recognition trials. Taken together, this suggests evidence for rapid forgetting and an evolving and already quite significant memory storage impairment, both of which are hallmark characteristics of this illness. Additional impairments across confrontation naming and semantic fluency align very well with this illness as they are the expected domains to exhibit impairment following early memory dysfunction. Visuospatial weakness also aligns well with typical disease progression. While I am certainly not able to ascertain the validity of her allegations surrounding her husband's behaviors, both her husband and their daughter denied these actions and the emergence of suspicion and delusional thinking is certainly seen in individuals with Alzheimer's  disease.   [name] was accompanied by her husband and daughter during the current feedback session. Content of the current session focused on the results of her neuropsychological evaluation. [name] was given the opportunity to ask questions and her questions were answered. she was encouraged to reach out should additional questions arise. A copy of her report was provided at the conclusion of the visit.      32 minutes were spent conducting the current feedback session with [name], billed as one unit [phone].

## 2021-11-16 NOTE — Telephone Encounter (Signed)
This patient is fine for a 12 month follow up. She was a 4A previously.Majority of what they saw was most likely infectious or inflammatory

## 2021-11-18 ENCOUNTER — Other Ambulatory Visit: Payer: Self-pay | Admitting: Adult Health

## 2021-11-18 DIAGNOSIS — E039 Hypothyroidism, unspecified: Secondary | ICD-10-CM

## 2021-11-19 ENCOUNTER — Telehealth: Payer: Self-pay | Admitting: *Deleted

## 2021-11-19 DIAGNOSIS — Z122 Encounter for screening for malignant neoplasm of respiratory organs: Secondary | ICD-10-CM

## 2021-11-19 DIAGNOSIS — F17210 Nicotine dependence, cigarettes, uncomplicated: Secondary | ICD-10-CM

## 2021-11-19 DIAGNOSIS — Z87891 Personal history of nicotine dependence: Secondary | ICD-10-CM

## 2021-11-19 NOTE — Telephone Encounter (Signed)
See other telephone note 11/19/21.

## 2021-11-19 NOTE — Telephone Encounter (Signed)
Called and spoke with pt's husband Gwyndolyn Saxon [facility]) and reviewed recent lung screening CT results. I advised that the previous nodule of concern has resolved. There is smaller areas noted that appear to be related to possible infection/ inflammation. He reports that pt has had some increased chest congestion but that seems to be improving. I also advised that there was emphysema note related to smooing history and also some coronary calcifications noted. Pt is currently on Lipitor. I advised that copy of CT has been sent to Dorothyann Peng, NP and that we will plan to repeat lung screening CT in 12 months.

## 2021-11-20 ENCOUNTER — Encounter: Payer: Self-pay | Admitting: Physician Assistant

## 2021-11-20 ENCOUNTER — Ambulatory Visit: Payer: Medicare PPO | Admitting: Physician Assistant

## 2021-11-20 VITALS — BP 135/82 | HR 93 | Ht 64.0 in | Wt 101.0 lb

## 2021-11-20 DIAGNOSIS — F0280 Dementia in other diseases classified elsewhere without behavioral disturbance: Secondary | ICD-10-CM | POA: Diagnosis not present

## 2021-11-20 DIAGNOSIS — G309 Alzheimer's disease, unspecified: Secondary | ICD-10-CM

## 2021-11-20 MED ORDER — DONEPEZIL HCL 10 MG PO TABS: 10.0000 mg | ORAL_TABLET | Freq: Every day | ORAL | 4 refills | Status: DC

## 2021-11-20 MED ORDER — DONEPEZIL HCL 10 MG PO TABS: 10.0000 mg | ORAL_TABLET | Freq: Every day | ORAL | 11 refills | Status: DC

## 2021-11-20 NOTE — Progress Notes (Addendum)
Assessment/Plan:     The patient is seen in neurologic consultation at the request of Hazle Coca, PhD for the evaluation of memory.  Emma Schneider is a very pleasant 71 y.o. 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  MRI of the brain January 2023 remarkable for generalized atrophy, negative for hydrocephalus, and with mild to moderate microvascular ischemic changes. Neuropsychological evaluation concerning for dementia due to Alzheimer's disease.    Dementia due to Alzheimer's Disease  Check B12, TSH Repeat the neurocognitive testing in 24 months  Increase donepezil 10 mg daily [place] up in 6 months  Continue to control mood as per PCP   Subjective:     The patient is accompanied by her husband and her daughter who supplement the history.   How long did patient have memory difficulties?  Her family report that the memory issues have been present for about 5 years worse since about Oct 2022. She also has greater difficulty with reading comprehension.  She has more difficulty with crossword puzzles and word finding.  Long-term memory is normal. There are difficulties with multitasking, organization and impulsivity. Patient lives with: Spouse  repeats oneself? Endorsed "occasionally", although her husband says that this is more frequent than prior. Disoriented when walking into a room?  "It can be a scary situation if she is in a new environment".   Leaving objects in unusual places?  Patient denies   Ambulates  with difficulty?  "Getting a little more wobbly than before ". She has to think ahead if she is in uneven ground.   Recent falls?  Patient denies   Any head injuries?  Patient denies   History of seizures?   Patient denies   Wandering behavior?  Patient denies   Patient drives?  Her husband does not allow her to drive alone, and she only drives very short distances,  preventing major intersections. Any mood changes?  Family report more impulsivity, she becomes irritable more quickly, especially when confronted.  She was taking Wellbutrin which was discontinued due to hallucinations. Any history of depression?:  Denies Hallucinations? Around 03/2021 she began having visual hallucinations, such as seeing dogs and cats under her porch, and animals roaming through the house, but after discontinuing Wellbutrin these hallucinations disappeared.  Paranoia? About 1 year ago it was reported the patient began accusing her husband of having affairs.  This has subsided. Patient reports that she sleeps well without vivid dreams, REM behavior or sleepwalking    History of sleep apnea?  Patient denies   Any hygiene concerns?  Patient denies   Independent of bathing and dressing?  Endorsed  Does the patient needs help with medications?  Husband is in charge Who is in charge of the finances?  Husband is in charge    Any changes in appetite?  Patient denies   Patient have trouble swallowing? Patient denies   Does the patient cook?  Patient denies .  "Has forgotten common recipes for the last year, has become more confused between the tsp and Tsp " Any kitchen accidents such as leaving the stove on? Patient denies   Any headaches?  Only when stressed. Double vision? Patient denies   Any focal numbness or tingling?  Patient denies   Chronic back pain Patient denies   Unilateral weakness?  Patient denies   Any tremors?  Patient denies   Any history of  anosmia?  Patient denies   Any incontinence of urine?  Patient denies   Any bowel dysfunction?   Patient denies      History of heavy alcohol intake?  Patient denies   History of heavy tobacco use?  Endorsed Family history of dementia?  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  Neuropsych evaluation 10/2021 Dr. Melvyn Novas  Briefly, results suggested primary impairments surrounding semantic fluency, confrontation naming, and  essentially all aspects of learning and memory. Additional weaknesses were exhibited across complex attention, receptive language, and visuospatial abilities, while variability was exhibited across processing speed, basic attention, and executive functioning. Regarding etiology, I unfortunately have concerns surrounding underlying Alzheimer's disease. Ms. Mull did not benefit from repeated exposure to information across learning trials, performed poorly across delayed recall trials, and performed poorly across yes/no recognition trials. Taken together, this suggests evidence for rapid forgetting and an evolving and already quite significant memory storage impairment, both of which are hallmark characteristics of this illness. Additional impairments across confrontation naming and semantic fluency align very well with this illness as they are the expected domains to exhibit impairment following early memory dysfunction. Visuospatial weakness also aligns well with typical disease progression. While I am certainly not able to ascertain the validity of her allegations surrounding her husband's behaviors, both her husband and their daughter denied these actions and the emergence of suspicion and delusional thinking is certainly seen in individuals with Alzheimer's disease.   Allergies  Allergen Reactions   Wellbutrin [Bupropion] Other (See Comments)    Hallucinations     Current Outpatient Medications  Medication Instructions   atorvastatin (LIPITOR) 10 MG tablet Take 1 tablet by mouth once daily   calcium carbonate (OS-CAL) 1,200 mg, Oral, Daily   Cholecalciferol (VITAMIN D3) 25 MCG (1000 UT) CAPS 1 capsule   Cholecalciferol 3,000 Units, Oral, Daily   donepezil (ARICEPT) 5 mg, Oral, Daily at bedtime   Ferrous Sulfate Dried (FEOSOL) 200 (65 Fe) MG TABS See admin instructions   ferrous sulfate 324 mg, Oral   levothyroxine (SYNTHROID) 25 MCG tablet TAKE 1 TABLET BY MOUTH ONCE DAILY BEFORE BREAKFAST    mirtazapine (REMERON) 15 MG tablet TAKE 1 TABLET BY MOUTH AT BEDTIME   Zoledronic Acid (ZOMETA) 4 MG/100ML IVPB 100 ml over 15 minutes     VITALS:   Vitals:   11/20/21 0748  BP: 135/82  Pulse: 93  SpO2: 99%  Weight: 101 lb (45.8 kg)  Height: '5\' 4"'$  (1.626 m)      PHYSICAL EXAM   HEENT:  Normocephalic, atraumatic. The mucous membranes are moist. The superficial temporal arteries are without ropiness or tenderness. Cardiovascular: Regular rate and rhythm. Lungs: Clear to auscultation bilaterally. Neck: There are no carotid bruits noted bilaterally.  NEUROLOGICAL:     No data to display              No data to display           Orientation:  Alert and oriented to person, place and time. No aphasia or dysarthria. Fund of knowledge is appropriate. Recent memory impaired and remote memory normal.  Attention and concentration today are normal.  Able to name objects and repeat phrases.   Cranial nerves: There is good facial symmetry. Extraocular muscles are intact and visual fields are full to confrontational testing. Speech is fluent and clear. Soft palate rises symmetrically and there is no tongue deviation. Hearing is intact to conversational tone. Tone: Tone is good throughout. Sensation: Sensation is intact to light  touch and pinprick throughout. Vibration is intact at the bilateral big toe.There is no extinction with double simultaneous stimulation. There is no sensory dermatomal level identified. Coordination: The patient has no difficulty with RAM's or FNF bilaterally. Normal finger to nose  Motor: Strength is 5/5 in the bilateral upper and lower extremities. There is no pronator drift. There are no fasciculations noted. DTR's: Deep tendon reflexes are 2/4 at the bilateral biceps, triceps, brachioradialis, patella and achilles.  Plantar responses are downgoing bilaterally. Gait and Station: The patient is able to ambulate without difficulty.The patient is able to heel toe  walk without any difficulty.The patient is able to ambulate in a tandem fashion. The patient is able to stand in the Romberg position.    Thank you for allowing [place] the opportunity to participate in the care of this nice patient. Please do not hesitate to contact us for any questions or concerns.   Total time spent on today's visit was 60 minutes dedicated to this patient today, preparing to see patient, examining the patient, ordering tests and/or medications and counseling the patient, documenting clinical information in the EHR or other health record, independently interpreting results and communicating results to the patient/family, discussing treatment and goals, answering patient's questions and coordinating care.  Cc:  Dorothyann Peng, NP  Sharene Butters 11/20/2021 8:39 AM

## 2021-11-20 NOTE — Patient Instructions (Signed)
It was a pleasure to see you today at our office.   Recommendations:  Follow up in  6 months Increase donepezil to 10 mg daily. Side effects were discussed    Whom to call:  Memory  decline, memory medications: Call our office [phone]   For psychiatric meds, mood meds: Please have your primary care physician manage these medications.   Counseling regarding caregiver distress, including caregiver depression, anxiety and issues regarding community resources, adult day care programs, adult living facilities, or memory care questions:   Feel free to contact [place], Social Worker at [phone]   For assessment of decision of mental capacity and competency:  Call Dr. Anthoney Harada, geriatric psychiatrist at [phone]  For guidance in geriatric dementia issues please call [facility] [phone]  For guidance regarding [facility] and if placement were needed at the facility, contact Arnell Asal, Social Worker tel: [phone]  If you have any severe symptoms of a stroke, or other severe issues such as confusion,severe chills or fever, etc call 911 or go to the ER as you may need to be evaluated further   Feel free to visit Facebook page " [email or web address]" for tips of how to care for people with memory problems.   Feel free to go to the following database for funded clinical studies conducted around the world: http://saunders.com/       RECOMMENDATIONS FOR ALL PATIENTS WITH MEMORY PROBLEMS: 1. Continue to exercise (Recommend 30 minutes of walking everyday, or 3 hours every week) 2. Increase social interactions - continue going to [place] and enjoy social gatherings with friends and family 3. Eat healthy, avoid fried foods and eat more fruits and vegetables 4. Maintain adequate blood pressure, blood sugar, and blood cholesterol level. Reducing the risk of stroke and cardiovascular disease also helps promoting better memory. 5.  Avoid stressful situations. Live a simple life and avoid aggravations. Organize your time and prepare for the next day in anticipation. 6. Sleep well, avoid any interruptions of sleep and avoid any distractions in the bedroom that may interfere with adequate sleep quality 7. Avoid sugar, avoid sweets as there is a strong link between excessive sugar intake, diabetes, and cognitive impairment We discussed the Mediterranean diet, which has been shown to help patients reduce the risk of progressive memory disorders and reduces cardiovascular risk. This includes eating fish, eat fruits and green leafy vegetables, nuts like almonds and hazelnuts, walnuts, and also use olive oil. Avoid fast foods and fried foods as much as possible. Avoid sweets and sugar as sugar use has been linked to worsening of memory function.  There is always a concern of gradual progression of memory problems. If this is the case, then we may need to adjust level of care according to patient needs. Support, both to the patient and caregiver, should then be put into place.    The [employer] Association is here all day, every day for people facing Alzheimer's disease through our free 24/7 Helpline: [phone]. The Helpline provides reliable information and support to all those who need assistance, such as individuals living with memory loss, Alzheimer's or other dementia, caregivers, health care professionals and the public.  Our highly trained and knowledgeable staff can help you with: Understanding memory loss, dementia and Alzheimer's  Medications and other treatment options  General information about aging and brain health  Skills to provide quality care and to find the best care from professionals  Legal, financial and living-arrangement decisions Our Helpline also  features: Confidential care consultation provided by master's level clinicians who can help with decision-making support, crisis assistance and education on issues  families face every day  Help in a caller's preferred language using our translation service that features more than 200 languages and dialects  Referrals to local community programs, services and ongoing support     FALL PRECAUTIONS: Be cautious when walking. Scan the area for obstacles that may increase the risk of trips and falls. When getting up in the mornings, sit up at the edge of the bed for a few minutes before getting out of bed. Consider elevating the bed at the head end to avoid drop of blood pressure when getting up. Walk always in a well-lit room (use night lights in the walls). Avoid area rugs or power cords from appliances in the middle of the walkways. Use a walker or a cane if necessary and consider physical therapy for balance exercise. Get your eyesight checked regularly.  FINANCIAL OVERSIGHT: Supervision, especially oversight when making financial decisions or transactions is also recommended.  HOME SAFETY: Consider the safety of the kitchen when operating appliances like stoves, microwave oven, and blender. Consider having supervision and share cooking responsibilities until no longer able to participate in those. Accidents with firearms and other hazards in the house should be identified and addressed as well.   ABILITY TO BE LEFT ALONE: If patient is unable to contact 911 operator, consider using LifeLine, or when the need is there, arrange for someone to stay with patients. Smoking is a fire hazard, consider supervision or cessation. Risk of wandering should be assessed by caregiver and if detected at any point, supervision and safe proof recommendations should be instituted.  MEDICATION SUPERVISION: Inability to self-administer medication needs to be constantly addressed. Implement a mechanism to ensure safe administration of the medications.   DRIVING: Regarding driving, in patients with progressive memory problems, driving will be impaired. We advise to have someone  else do the driving if trouble finding directions or if minor accidents are reported. Independent driving assessment is available to determine safety of driving.   If you are interested in the driving assessment, you can contact the following:  [employer] in [place]  [place] [phone] or [phone]      [place] refers to food and lifestyle choices that are based on the traditions of countries located on the [employer]. This way of eating has been shown to help prevent certain conditions and improve outcomes for people who have chronic diseases, like kidney disease and heart disease. What are tips for following this plan? Lifestyle  Cook and eat meals together with your family, when possible. Drink enough fluid to keep your urine clear or pale yellow. Be physically active every day. This includes: Aerobic exercise like running or swimming. Leisure activities like gardening, walking, or housework. Get 7-8 hours of sleep each night. If recommended by your health care provider, drink red wine in moderation. This means 1 glass a day for nonpregnant women and 2 glasses a day for men. A glass of wine equals 5 oz (150 mL). Reading food labels  Check the serving size of packaged foods. For foods such as rice and pasta, the serving size refers to the amount of cooked product, not dry. Check the total fat in packaged foods. Avoid foods that have saturated fat or trans fats. Check the ingredients list for added sugars,  such as corn syrup. Shopping  At the grocery store, buy most of your food from the areas near the walls of the store. This includes: Fresh fruits and vegetables (produce). Grains, beans, nuts, and seeds. Some of these may be available in unpackaged forms or large amounts (in bulk). Fresh seafood. Poultry and  eggs. Low-fat dairy products. Buy whole ingredients instead of prepackaged foods. Buy fresh fruits and vegetables in-season from local farmers markets. Buy frozen fruits and vegetables in resealable bags. If you do not have access to quality fresh seafood, buy precooked frozen shrimp or canned fish, such as tuna, salmon, or sardines. Buy small amounts of raw or cooked vegetables, salads, or olives from the deli or salad bar at your store. Stock your pantry so you always have certain foods on hand, such as olive oil, canned tuna, canned tomatoes, rice, pasta, and beans. Cooking  Cook foods with extra-virgin olive oil instead of using butter or other vegetable oils. Have meat as a side dish, and have vegetables or grains as your main dish. This means having meat in small portions or adding small amounts of meat to foods like pasta or stew. Use beans or vegetables instead of meat in common dishes like chili or lasagna. Experiment with different cooking methods. Try roasting or broiling vegetables instead of steaming or sauteing them. Add frozen vegetables to soups, stews, pasta, or rice. Add nuts or seeds for added healthy fat at each meal. You can add these to yogurt, salads, or vegetable dishes. Marinate fish or vegetables using olive oil, lemon juice, garlic, and fresh herbs. Meal planning  Plan to eat 1 vegetarian meal one day each week. Try to work up to 2 vegetarian meals, if possible. Eat seafood 2 or more times a week. Have healthy snacks readily available, such as: Vegetable sticks with hummus. Greek yogurt. Fruit and nut trail mix. Eat balanced meals throughout the week. This includes: Fruit: 2-3 servings a day Vegetables: 4-5 servings a day Low-fat dairy: 2 servings a day Fish, poultry, or lean meat: 1 serving a day Beans and legumes: 2 or more servings a week Nuts and seeds: 1-2 servings a day Whole grains: 6-8 servings a day Extra-virgin olive oil: 3-4 servings a day Limit  red meat and sweets to only a few servings a month What are my food choices? Mediterranean diet Recommended Grains: Whole-grain pasta. Brown rice. Bulgar wheat. Polenta. Couscous. Whole-wheat bread. [place]. Vegetables: Artichokes. Beets. Broccoli. Cabbage. Carrots. Eggplant. Green beans. Chard. Kale. Spinach. Onions. Leeks. Peas. Squash. Tomatoes. Peppers. Radishes. Fruits: Apples. Apricots. Avocado. Berries. Bananas. Cherries. Dates. Figs. Grapes. Lemons. Melon. Oranges. Peaches. Plums. Pomegranate. Meats and other protein foods: Beans. Almonds. Sunflower seeds. Pine nuts. Peanuts. [place]. Salmon. Scallops. Shrimp. [place]. Tilapia. Clams. Oysters. Eggs. Dairy: Low-fat milk. Cheese. Greek yogurt. Beverages: Water. Red wine. Herbal tea. Fats and oils: Extra virgin olive oil. Avocado oil. Grape seed oil. Sweets and desserts: [place] yogurt with honey. Baked apples. Poached pears. Trail mix. Seasoning and other foods: Basil. Cilantro. Coriander. Cumin. Mint. Parsley. Sage. Rosemary. Tarragon. Garlic. Oregano. Thyme. Pepper. Balsalmic vinegar. Tahini. Hummus. Tomato sauce. Olives. Mushrooms. Limit these Grains: Prepackaged pasta or rice dishes. Prepackaged cereal with added sugar. Vegetables: Deep fried potatoes (french fries). Fruits: Fruit canned in syrup. Meats and other protein foods: Beef. Pork. Lamb. Poultry with skin. Hot dogs. [place] Salines. Dairy: Ice cream. Sour cream. Whole milk. Beverages: Juice. Sugar-sweetened soft drinks. Beer. Liquor and spirits. Fats and oils: Butter. Canola oil. Vegetable oil. Beef fat (  tallow). Lard. Sweets and desserts: Cookies. Cakes. Pies. Candy. Seasoning and other foods: Mayonnaise. Premade sauces and marinades. The items listed may not be a complete list. Talk with your dietitian about what dietary choices are right for you. Summary The Mediterranean diet includes both food and lifestyle choices. Eat a variety of fresh fruits and vegetables, beans, nuts,  seeds, and whole grains. Limit the amount of red meat and sweets that you eat. Talk with your health care provider about whether it is safe for you to drink red wine in moderation. This means 1 glass a day for nonpregnant women and 2 glasses a day for men. A glass of wine equals 5 oz (150 mL). This information is not intended to replace advice given to you by your health care provider. Make sure you discuss any questions you have with your health care provider. Document Released: 12/07/2015 Document Revised: 01/09/2016 Document Reviewed: 12/07/2015 Elsevier Interactive Patient Education  2017 [employer].

## 2021-12-04 ENCOUNTER — Other Ambulatory Visit: Payer: Self-pay | Admitting: Adult Health

## 2021-12-04 DIAGNOSIS — F32A Depression, unspecified: Secondary | ICD-10-CM

## 2021-12-04 DIAGNOSIS — R634 Abnormal weight loss: Secondary | ICD-10-CM

## 2022-01-23 ENCOUNTER — Ambulatory Visit (INDEPENDENT_AMBULATORY_CARE_PROVIDER_SITE_OTHER): Payer: Medicare PPO

## 2022-01-23 DIAGNOSIS — Z23 Encounter for immunization: Secondary | ICD-10-CM | POA: Diagnosis not present

## 2022-02-10 ENCOUNTER — Other Ambulatory Visit: Payer: Self-pay | Admitting: Adult Health

## 2022-02-10 DIAGNOSIS — E785 Hyperlipidemia, unspecified: Secondary | ICD-10-CM

## 2022-02-10 DIAGNOSIS — E039 Hypothyroidism, unspecified: Secondary | ICD-10-CM

## 2022-02-12 NOTE — Telephone Encounter (Signed)
Patient need to schedule an ov for more refills. 

## 2022-02-15 ENCOUNTER — Encounter: Payer: Self-pay | Admitting: Adult Health

## 2022-02-15 ENCOUNTER — Other Ambulatory Visit: Payer: Self-pay

## 2022-02-15 ENCOUNTER — Telehealth: Payer: Self-pay | Admitting: Adult Health

## 2022-02-15 DIAGNOSIS — E785 Hyperlipidemia, unspecified: Secondary | ICD-10-CM

## 2022-02-15 DIAGNOSIS — E039 Hypothyroidism, unspecified: Secondary | ICD-10-CM

## 2022-02-15 MED ORDER — ATORVASTATIN CALCIUM 10 MG PO TABS: 10.0000 mg | ORAL_TABLET | Freq: Every day | ORAL | 0 refills | Status: DC

## 2022-02-15 MED ORDER — LEVOTHYROXINE SODIUM 25 MCG PO TABS: 25.0000 ug | ORAL_TABLET | Freq: Every day | ORAL | 0 refills | Status: DC

## 2022-02-15 NOTE — Telephone Encounter (Addendum)
Pt's spouse called to ask for a refill of the following: levothyroxine (SYNTHROID) 25 MCG tablet  atorvastatin (LIPITOR) 10 MG tablet  LOV:  04/10/21  Pt & spouse were offered an OV but spouse stated he cannot keep taking her outside and they already have a CPE scheduled for 04/04/22.    [place], [place] - [address] Phone:  [phone]  Fax:  [phone]

## 2022-02-15 NOTE — Telephone Encounter (Signed)
Rx has been filled 

## 2022-03-04 ENCOUNTER — Other Ambulatory Visit: Payer: Self-pay | Admitting: Adult Health

## 2022-03-04 DIAGNOSIS — R634 Abnormal weight loss: Secondary | ICD-10-CM

## 2022-03-04 DIAGNOSIS — F32A Depression, unspecified: Secondary | ICD-10-CM

## 2022-03-10 ENCOUNTER — Other Ambulatory Visit: Payer: Self-pay | Admitting: Adult Health

## 2022-03-10 DIAGNOSIS — E785 Hyperlipidemia, unspecified: Secondary | ICD-10-CM

## 2022-03-10 DIAGNOSIS — E039 Hypothyroidism, unspecified: Secondary | ICD-10-CM

## 2022-04-04 ENCOUNTER — Ambulatory Visit (INDEPENDENT_AMBULATORY_CARE_PROVIDER_SITE_OTHER): Payer: Medicare PPO | Admitting: Adult Health

## 2022-04-04 ENCOUNTER — Encounter: Payer: Self-pay | Admitting: Adult Health

## 2022-04-04 VITALS — BP 120/60 | HR 60 | Temp 97.7°F | Ht 63.25 in | Wt 97.0 lb

## 2022-04-04 DIAGNOSIS — Z0000 Encounter for general adult medical examination without abnormal findings: Secondary | ICD-10-CM | POA: Diagnosis not present

## 2022-04-04 DIAGNOSIS — F32A Depression, unspecified: Secondary | ICD-10-CM

## 2022-04-04 DIAGNOSIS — F0280 Dementia in other diseases classified elsewhere without behavioral disturbance: Secondary | ICD-10-CM | POA: Diagnosis not present

## 2022-04-04 DIAGNOSIS — G309 Alzheimer's disease, unspecified: Secondary | ICD-10-CM

## 2022-04-04 DIAGNOSIS — E785 Hyperlipidemia, unspecified: Secondary | ICD-10-CM

## 2022-04-04 DIAGNOSIS — F17200 Nicotine dependence, unspecified, uncomplicated: Secondary | ICD-10-CM

## 2022-04-04 DIAGNOSIS — M816 Localized osteoporosis [Lequesne]: Secondary | ICD-10-CM | POA: Diagnosis not present

## 2022-04-04 DIAGNOSIS — E559 Vitamin D deficiency, unspecified: Secondary | ICD-10-CM

## 2022-04-04 DIAGNOSIS — F419 Anxiety disorder, unspecified: Secondary | ICD-10-CM | POA: Diagnosis not present

## 2022-04-04 DIAGNOSIS — E278 Other specified disorders of adrenal gland: Secondary | ICD-10-CM | POA: Diagnosis not present

## 2022-04-04 DIAGNOSIS — C9201 Acute myeloblastic leukemia, in remission: Secondary | ICD-10-CM | POA: Diagnosis not present

## 2022-04-04 DIAGNOSIS — E039 Hypothyroidism, unspecified: Secondary | ICD-10-CM

## 2022-04-04 LAB — CBC WITH DIFFERENTIAL/PLATELET
Basophils Absolute: 0.1 10*3/uL (ref 0.0–0.1)
Basophils Relative: 0.9 % (ref 0.0–3.0)
Eosinophils Absolute: 0 10*3/uL (ref 0.0–0.7)
Eosinophils Relative: 0.7 % (ref 0.0–5.0)
HCT: 40.9 % (ref 36.0–46.0)
Hemoglobin: 13.9 g/dL (ref 12.0–15.0)
Lymphocytes Relative: 33.9 % (ref 12.0–46.0)
Lymphs Abs: 1.9 10*3/uL (ref 0.7–4.0)
MCHC: 34.1 g/dL (ref 30.0–36.0)
MCV: 90.8 fl (ref 78.0–100.0)
Monocytes Absolute: 0.5 10*3/uL (ref 0.1–1.0)
Monocytes Relative: 8.4 % (ref 3.0–12.0)
Neutro Abs: 3.1 10*3/uL (ref 1.4–7.7)
Neutrophils Relative %: 56.1 % (ref 43.0–77.0)
Platelets: 274 10*3/uL (ref 150.0–400.0)
RBC: 4.51 Mil/uL (ref 3.87–5.11)
RDW: 13.1 % (ref 11.5–15.5)
WBC: 5.5 10*3/uL (ref 4.0–10.5)

## 2022-04-04 LAB — COMPREHENSIVE METABOLIC PANEL
ALT: 18 U/L (ref 0–35)
AST: 23 U/L (ref 0–37)
Albumin: 4.2 g/dL (ref 3.5–5.2)
Alkaline Phosphatase: 71 U/L (ref 39–117)
BUN: 16 mg/dL (ref 6–23)
CO2: 32 mEq/L (ref 19–32)
Calcium: 9.6 mg/dL (ref 8.4–10.5)
Chloride: 103 mEq/L (ref 96–112)
Creatinine, Ser: 1.04 mg/dL (ref 0.40–1.20)
GFR: 53.79 mL/min — ABNORMAL LOW (ref >60.00)
Glucose, Bld: 92 mg/dL (ref 70–99)
Potassium: 4 mEq/L (ref 3.5–5.1)
Sodium: 141 mEq/L (ref 135–145)
Total Bilirubin: 0.6 mg/dL (ref 0.2–1.2)
Total Protein: 7.1 g/dL (ref 6.0–8.3)

## 2022-04-04 LAB — HEMOGLOBIN A1C: Hgb A1c MFr Bld: 5.9 % (ref 4.6–6.5)

## 2022-04-04 LAB — LIPID PANEL
Cholesterol: 136 mg/dL (ref 0–200)
HDL: 59.3 mg/dL (ref >39.00)
LDL Cholesterol: 61 mg/dL (ref 0–99)
NonHDL: 76.95
Total CHOL/HDL Ratio: 2
Triglycerides: 78 mg/dL (ref 0.0–149.0)
VLDL: 15.6 mg/dL (ref 0.0–40.0)

## 2022-04-04 LAB — VITAMIN D 25 HYDROXY (VIT D DEFICIENCY, FRACTURES): VITD: 50.99 ng/mL (ref 30.00–100.00)

## 2022-04-04 LAB — TSH: TSH: 1.51 u[IU]/mL (ref 0.35–5.50)

## 2022-04-04 NOTE — Patient Instructions (Signed)
It was great seeing you today   We will follow up with you regarding your lab work   Please let me know if you need anything   Please get your shingles vaccination at the pharmacy.

## 2022-04-04 NOTE — Progress Notes (Signed)
Subjective:    Patient ID: Emma Schneider, female    DOB: January 31, 1951, 71 y.o.   MRN: [id number]  HPI Patient presents for yearly preventative medicine examination. 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  Hyperlipidemia-managed with Lipitor 10 mg daily.  She denies fatigue, myalgia, or chest pain. Lab Results  Component Value Date   CHOL 124 02/13/2021   HDL 55.20 02/13/2021   LDLCALC 51 02/13/2021   TRIG 87.0 02/13/2021   CHOLHDL 2 02/13/2021   Osteopenia-currently managed with Actonel 35 mg IV yearly.  This is managed by GYN.  Her last bone density was in July 2022  Acute myeloid leukemia-diagnosed in 2000 with bone marrow biopsy.  In the past she has been managed by [facility] hematology/oncology on a yearly basis.  At this time she was released and was advised that her PCP managed with yearly CBCs as a risk for early relapse at this time was incredibly low.  She denies fevers, chills, anorexia, weight loss, nausea, vomiting, or diarrhea  Right adrenal adenoma-followed by [facility] endocrinology  Hypothyroidism-managed with Synthroid 25 mcg daily  Tobacco use-continues to smoke less than a half a pack a day. She understands that she needs to quit.   Vitamin D deficiency-takes vitamin D3 1000 units and calcium daily  Alzheimer's dementia-been seen by neurology.  MRI of the brain in January 2023 was remarkable for generalized atrophy, negative for hydrocephalus, and with mild to moderate  microvascular ischemic changes.  Neuropsychiatry evaluation with Dr. Carolan Clines is concerning for dementia due to Alzheimer's disease.  She is currently managed with Aricept 10 mg daily. She is no longer driving but is doing the rest of her ADL's.  Her husband reports today that he has noticed that her cognitive function has been decreasing.  Trouble remembering instructions and he will have to repeat himself multiple times.  She is also unable to watch TV movies that she loses interest weight due to not being able to comprehend or follow the story.  She does become frustrated from time to time.  Does not have any hallucinations  Anxiety/Depression/Insomnia - She is maintained on Remeron 15 . Her husband reports that she is sleeping well, anxiety is controlled and her appetite is good.   All immunizations and health maintenance protocols were reviewed with the patient and needed orders were placed.  Appropriate screening laboratory values were ordered for the patient including screening of hyperlipidemia, renal function and hepatic function.  Medication reconciliation,  past medical history, social history, problem list and allergies were reviewed in detail with the patient  Goals were established with regard to weight loss, exercise, and  diet in compliance with medications Wt Readings from Last 3 Encounters:  04/04/22 97 lb (44 kg)  11/20/21 101 lb (45.8 kg)  05/01/21 93 lb 4.8 oz (42.3 kg)   Review of Systems  Constitutional: Negative.   HENT: Negative.    Eyes: Negative.   Respiratory: Negative.    Cardiovascular: Negative.   Gastrointestinal: Negative.   Endocrine: Negative.   Genitourinary: Negative.   Musculoskeletal: Negative.   Skin: Negative.   Allergic/Immunologic: Negative.   Neurological: Negative.  Hematological: Negative.   Psychiatric/Behavioral:  Positive for confusion and decreased concentration. Negative for agitation, behavioral problems, dysphoric mood, hallucinations,  sleep disturbance and suicidal ideas. The patient is not nervous/anxious.    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 Dr. Laurance Flatten   Aortic atherosclerosis    Chicken pox    Dementia likely due to Alzheimer's disease 11/06/2021   History of antineoplastic chemotherapy 07/24/2015   Hyperlipidemia    Microscopic hematuria 10/17/2014   Osteopenia    now OSTEOPOROSIS, dexa 2017- R Femur -2.2 was on Actonel prior so FRAX not appropriate but it was reported at major 10.9% and hip 3.1%- dexa ordered 2019, never had done, but HPI reported pt had dexa 2019 at last annual so not ordered. Will repeat dexa this year and Rx risidronate in the mean time. Asked pt to find out from PCP if they had prescribed Actonel prior so we can determine how many year   Personal history of colonic polyps 09/27/2016   3 polyps removed, repeated 02/2020, due 3 yrs   Thyroid dysfunction 05/11/2019   dx 02/2019, started on Euthyrox   Tobacco use 01/06/2015   Vitamin D deficiency     Social History   Socioeconomic History   Marital status: Married    Spouse name: Not on file   Number of children: Not on file   Years of education: 13   Highest education level: Some college, no degree  Occupational History   Occupation: Retired    Comment: Art therapist  Tobacco Use   Smoking status: Every Day    Packs/day: 1.00    Years: 42.00    Total pack years: 42.00    Types: Cigarettes   Smokeless tobacco: Never   Tobacco comments:    Is actively trying to cut down number of cigarettes, but not ready to set a quit date. 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    -Married for 30 years    - Has step children, all live in [place]   - No pets   - Plays games on the computer and enjoys yard work.    Two story home   Caffeine a lot of pepsi   Social Determinants of Health   Financial Resource Strain: Low Risk  (05/01/2021)   Overall Financial Resource Strain (CARDIA)    Difficulty of Paying Living Expenses: Not hard at all  Food Insecurity: No Food Insecurity (05/01/2021)   Hunger Vital Sign    Worried About Running Out of Food in the Last Year: Never true    Ran Out of Food in the Last Year: Never true  Transportation Needs: No Transportation Needs (05/01/2021)   PRAPARE - Hydrologist (Medical): No    Lack of Transportation (Non-Medical): No  Physical Activity: Inactive (05/01/2021)   Exercise Vital Sign    Days of Exercise per Week: 0 days    Minutes of Exercise per Session:  0 min  Stress: Stress Concern Present (05/01/2021)   Gays    Feeling of Stress : To some extent  Social Connections: Moderately Integrated (04/18/2020)   Social Connection and Isolation Panel [NHANES]    Frequency of Communication with Friends and Family: More than three times a week    Frequency of Social Gatherings with Friends and Family: Twice a week    Attends Religious Services: More than 4 times per year    Active Member of [employer] or Organizations: No    Attends Archivist Meetings: Never    Marital Status: Married  Human resources officer Violence: Not At Risk (04/18/2020)   Humiliation, Afraid, Rape, and Kick questionnaire    Fear of Current or Ex-Partner: No    Emotionally Abused: No    Physically Abused: No    Sexually Abused: No    History reviewed. No pertinent surgical history.  Family History  Problem Relation Age of Onset   Congestive Heart Failure  Mother    Dementia Mother        late in life   Heart attack Father    Heart disease Father     Allergies  Allergen Reactions   Wellbutrin [Bupropion] Other (See Comments)    Hallucinations     Current Outpatient Medications on File Prior to Visit  Medication Sig Dispense Refill   atorvastatin (LIPITOR) 10 MG tablet Take 1 tablet by mouth once daily 30 tablet 0   calcium carbonate (OS-CAL) 600 MG TABS tablet Take 1,200 mg by mouth daily.     Cholecalciferol (VITAMIN D3) 25 MCG (1000 UT) CAPS      Cholecalciferol 3000 units TABS Take 3,000 Units by mouth daily. 90 tablet 3   donepezil (ARICEPT) 10 MG tablet Take 1 tablet (10 mg total) by mouth at bedtime. 90 tablet 4   ferrous sulfate 324 MG TBEC Take 324 mg by mouth.     Ferrous Sulfate Dried (FEOSOL) 200 (65 Fe) MG TABS See admin instructions.     levothyroxine (SYNTHROID) 25 MCG tablet TAKE 1 TABLET BY MOUTH DAILY BEFORE BREAKFAST 30 tablet 0   mirtazapine (REMERON) 15 MG tablet TAKE 1 TABLET BY MOUTH AT BEDTIME 90 tablet 0   Zoledronic Acid (ZOMETA) 4 MG/100ML IVPB 100 ml over 15 minutes     No current facility-administered medications on file prior to visit.    BP 120/60   Pulse 60   Temp 97.7 F (36.5 C) (Oral)   Ht 5' 3.25" (1.607 m)   Wt 97 lb (44 kg)   SpO2 97%   BMI 17.05 kg/m       Objective:   Physical Exam Vitals and nursing note reviewed.  Constitutional:      General: She is not in acute distress.    Appearance: Normal appearance. She is well-developed. She is not ill-appearing.  HENT:     Head: Normocephalic and atraumatic.     Right Ear: Tympanic membrane, ear canal and external ear normal. There is no impacted cerumen.     Left Ear: Tympanic membrane, ear canal and external ear normal. There is no impacted cerumen.     Nose: Nose normal. No congestion or rhinorrhea.     Mouth/Throat:     Mouth: Mucous membranes are moist.     Pharynx: Oropharynx is clear. No oropharyngeal exudate or posterior  oropharyngeal erythema.  Eyes:     General:        Right  eye: No discharge.        Left eye: No discharge.     Extraocular Movements: Extraocular movements intact.     Conjunctiva/sclera: Conjunctivae normal.     Pupils: Pupils are equal, round, and reactive to light.  Neck:     Thyroid: No thyromegaly.     Vascular: No carotid bruit.     Trachea: No tracheal deviation.  Cardiovascular:     Rate and Rhythm: Normal rate and regular rhythm.     Pulses: Normal pulses.     Heart sounds: Normal heart sounds. No murmur heard.    No friction rub. No gallop.  Pulmonary:     Effort: Pulmonary effort is normal. No respiratory distress.     Breath sounds: Normal breath sounds. No stridor. No wheezing, rhonchi or rales.  Chest:     Chest wall: No tenderness.  Abdominal:     General: Abdomen is flat. Bowel sounds are normal. There is no distension.     Palpations: Abdomen is soft. There is no mass.     Tenderness: There is no abdominal tenderness. There is no right CVA tenderness, left CVA tenderness, guarding or rebound.     Hernia: No hernia is present.  Musculoskeletal:        General: No swelling, tenderness, deformity or signs of injury. Normal range of motion.     Cervical back: Normal range of motion and neck supple.     Right lower leg: No edema.     Left lower leg: No edema.  Lymphadenopathy:     Cervical: No cervical adenopathy.  Skin:    General: Skin is warm and dry.     Coloration: Skin is not jaundiced or pale.     Findings: No bruising, erythema, lesion or rash.  Neurological:     Mental Status: She is alert. Mental status is at baseline.     Cranial Nerves: No cranial nerve deficit.     Sensory: No sensory deficit.     Motor: No weakness.     Coordination: Coordination normal.     Gait: Gait normal.     Deep Tendon Reflexes: Reflexes normal.  Psychiatric:        Mood and Affect: Mood and affect normal.        Speech: Speech normal.        Behavior: Behavior normal.         Thought Content: Thought content normal.        Cognition and Memory: Cognition is impaired. Memory is impaired. She exhibits impaired recent memory and impaired remote memory.        Judgment: Judgment normal.        Assessment & Plan:  1. Routine general medical examination at a health care facility - Follow up in one year  - CBC with Differential/Platelet; Future - Comprehensive metabolic panel; Future - Hemoglobin A1c; Future - Lipid panel; Future - TSH; Future - TSH - Lipid panel - Hemoglobin A1c - Comprehensive metabolic panel - CBC with Differential/Platelet  2. Hypothyroidism, unspecified type - Consider increase in synthroid  - CBC with Differential/Platelet; Future - Comprehensive metabolic panel; Future - Hemoglobin A1c; Future - Lipid panel; Future - TSH; Future - TSH - Lipid panel - Hemoglobin A1c - Comprehensive metabolic panel - CBC with Differential/Platelet  3. Hyperlipidemia, unspecified hyperlipidemia type - Consider increase in statin  - CBC with Differential/Platelet; Future - Comprehensive metabolic panel; Future - Hemoglobin A1c; Future - Lipid panel; Future - TSH; Future -  TSH - Lipid panel - Hemoglobin A1c - Comprehensive metabolic panel - CBC with Differential/Platelet  4. Anxiety and depression - Controlled with Remeron   5. Adrenal incidentaloma [facility]) - Per [place] [facility]  - CBC with Differential/Platelet; Future - Comprehensive metabolic panel; Future - Hemoglobin A1c; Future - Lipid panel; Future - TSH; Future - TSH - Lipid panel - Hemoglobin A1c - Comprehensive metabolic panel - CBC with Differential/Platelet  6. Vitamin D deficiency  - VITAMIN D 25 Hydroxy (Vit-D Deficiency, Fractures); Future - VITAMIN D 25 Hydroxy (Vit-D Deficiency, Fractures)  7. Acute myeloid leukemia in remission ([place])  - CBC with Differential/Platelet; Future - Comprehensive metabolic panel; Future - Hemoglobin A1c; Future -  Lipid panel; Future - TSH; Future - TSH - Lipid panel - Hemoglobin A1c - Comprehensive metabolic panel - CBC with Differential/Platelet  8. Tobacco use disorder - needs to quit smoking   9. Dementia likely due to Alzheimer's disease - Continue with Aricept - Follow up with Neurology as directed - Encouraged husband to help her become as independent as possible  - CBC with Differential/Platelet; Future - Comprehensive metabolic panel; Future - Hemoglobin A1c; Future - Lipid panel; Future - TSH; Future - VITAMIN D 25 Hydroxy (Vit-D Deficiency, Fractures); Future - VITAMIN D 25 Hydroxy (Vit-D Deficiency, Fractures) - TSH - Lipid panel - Hemoglobin A1c - Comprehensive metabolic panel - CBC with Differential/Platelet  10. Localized osteoporosis without current pathological fracture  - CBC with Differential/Platelet; Future - Comprehensive metabolic panel; Future - Hemoglobin A1c; Future - Lipid panel; Future - TSH; Future - VITAMIN D 25 Hydroxy (Vit-D Deficiency, Fractures); Future - VITAMIN D 25 Hydroxy (Vit-D Deficiency, Fractures) - TSH - Lipid panel - Hemoglobin A1c - Comprehensive metabolic panel - CBC with Differential/Platelet  Dorothyann Peng, NP

## 2022-04-05 ENCOUNTER — Other Ambulatory Visit: Payer: Self-pay | Admitting: Adult Health

## 2022-04-05 DIAGNOSIS — E785 Hyperlipidemia, unspecified: Secondary | ICD-10-CM

## 2022-04-05 DIAGNOSIS — R634 Abnormal weight loss: Secondary | ICD-10-CM

## 2022-04-05 DIAGNOSIS — E039 Hypothyroidism, unspecified: Secondary | ICD-10-CM

## 2022-04-05 DIAGNOSIS — F419 Anxiety disorder, unspecified: Secondary | ICD-10-CM

## 2022-04-05 MED ORDER — MIRTAZAPINE 15 MG PO TABS: 15.0000 mg | ORAL_TABLET | Freq: Every day | ORAL | 3 refills | Status: DC

## 2022-04-05 MED ORDER — LEVOTHYROXINE SODIUM 25 MCG PO TABS: 25.0000 ug | ORAL_TABLET | Freq: Every day | ORAL | 3 refills | Status: AC

## 2022-04-05 MED ORDER — ATORVASTATIN CALCIUM 10 MG PO TABS: 10.0000 mg | ORAL_TABLET | Freq: Every day | ORAL | 3 refills | Status: AC

## 2022-05-07 ENCOUNTER — Ambulatory Visit: Payer: Medicare PPO

## 2022-05-07 ENCOUNTER — Ambulatory Visit (INDEPENDENT_AMBULATORY_CARE_PROVIDER_SITE_OTHER): Payer: Medicare PPO

## 2022-05-07 VITALS — BP 120/60 | HR 62 | Temp 97.9°F | Ht 63.0 in | Wt 101.1 lb

## 2022-05-07 DIAGNOSIS — Z0000 Encounter for general adult medical examination without abnormal findings: Secondary | ICD-10-CM

## 2022-05-07 NOTE — Patient Instructions (Addendum)
Emma Schneider , Thank you for taking time to come for your Medicare Wellness Visit. I appreciate your ongoing commitment to your health goals. Please review the following plan we discussed and let me know if I can assist you in the future.   These are the goals we discussed:  Goals       Increase physical activity      Patient Stated      Maintain health      Patient Stated      05/01/2021, no goals      Quit smoking / using tobacco      Stay Healthy (pt-stated)        This is a list of the screening recommended for you and due dates:  Health Maintenance  Topic Date Due   COVID-19 Vaccine (4 - 2023-24 season) 05/23/2022*   Zoster (Shingles) Vaccine (1 of 2) 08/06/2022*   Mammogram  05/08/2023*   Screening for Lung Cancer  11/15/2022   Medicare Annual Wellness Visit  05/08/2023   DTaP/Tdap/Td vaccine (2 - Td or Tdap) 09/19/2024   Colon Cancer Screening  10/26/2026   Pneumonia Vaccine  Completed   Flu Shot  Completed   DEXA scan (bone density measurement)  Completed   Hepatitis C Screening: USPSTF Recommendation to screen - Ages 11-79 yo.  Completed   HPV Vaccine  Aged Out  *Topic was postponed. The date shown is not the original due date.    Advanced directives: Please bring a copy of your health care power of attorney and living will to the office to be added to your chart at your convenience.   Conditions/risks identified: None  Next appointment: Follow up in one year for your annual wellness visit     Preventive Care 65 Years and Older, Female Preventive care refers to lifestyle choices and visits with your health care provider that can promote health and wellness. What does preventive care include? A yearly physical exam. This is also called an annual well check. Dental exams once or twice a year. Routine eye exams. Ask your health care provider how often you should have your eyes checked. Personal lifestyle choices, including: Daily care of your teeth and  gums. Regular physical activity. Eating a healthy diet. Avoiding tobacco and drug use. Limiting alcohol use. Practicing safe sex. Taking low-dose aspirin every day. Taking vitamin and mineral supplements as recommended by your health care provider. What happens during an annual well check? The services and screenings done by your health care provider during your annual well check will depend on your age, overall health, lifestyle risk factors, and family history of disease. Counseling  Your health care provider may ask you questions about your: Alcohol use. Tobacco use. Drug use. Emotional well-being. Home and relationship well-being. Sexual activity. Eating habits. History of falls. Memory and ability to understand (cognition). Work and work Statistician. Reproductive health. Screening  You may have the following tests or measurements: Height, weight, and BMI. Blood pressure. Lipid and cholesterol levels. These may be checked every 5 years, or more frequently if you are over 70 years old. Skin check. Lung cancer screening. You may have this screening every year starting at age 46 if you have a 30-pack-year history of smoking and currently smoke or have quit within the past 15 years. Fecal occult blood test (FOBT) of the stool. You may have this test every year starting at age 61. Flexible sigmoidoscopy or colonoscopy. You may have a sigmoidoscopy every 5 years or a colonoscopy  every 10 years starting at age 6. Hepatitis C blood test. Hepatitis B blood test. Sexually transmitted disease (STD) testing. Diabetes screening. This is done by checking your blood sugar (glucose) after you have not eaten for a while (fasting). You may have this done every 1-3 years. Bone density scan. This is done to screen for osteoporosis. You may have this done starting at age 80. Mammogram. This may be done every 1-2 years. Talk to your health care provider about how often you should have regular  mammograms. Talk with your health care provider about your test results, treatment options, and if necessary, the need for more tests. Vaccines  Your health care provider may recommend certain vaccines, such as: Influenza vaccine. This is recommended every year. Tetanus, diphtheria, and acellular pertussis (Tdap, Td) vaccine. You may need a Td booster every 10 years. Zoster vaccine. You may need this after age 73. Pneumococcal 13-valent conjugate (PCV13) vaccine. One dose is recommended after age 56. Pneumococcal polysaccharide (PPSV23) vaccine. One dose is recommended after age 27. Talk to your health care provider about which screenings and vaccines you need and how often you need them. This information is not intended to replace advice given to you by your health care provider. Make sure you discuss any questions you have with your health care provider. Document Released: 05/12/2015 Document Revised: 01/03/2016 Document Reviewed: 02/14/2015 Elsevier Interactive Patient Education  2017 [place] Prevention in the Home Falls can cause injuries. They can happen to people of all ages. There are many things you can do to make your home safe and to help prevent falls. What can I do on the outside of my home? Regularly fix the edges of walkways and driveways and fix any cracks. Remove anything that might make you trip as you walk through a door, such as a raised step or threshold. Trim any bushes or trees on the path to your home. Use bright outdoor lighting. Clear any walking paths of anything that might make someone trip, such as rocks or tools. Regularly check to see if handrails are loose or broken. Make sure that both sides of any steps have handrails. Any raised decks and porches should have guardrails on the edges. Have any leaves, snow, or ice cleared regularly. Use sand or salt on walking paths during winter. Clean up any spills in your garage right away. This includes oil  or grease spills. What can I do in the bathroom? Use night lights. Install grab bars by the toilet and in the tub and shower. Do not use towel bars as grab bars. Use non-skid mats or decals in the tub or shower. If you need to sit down in the shower, use a plastic, non-slip stool. Keep the floor dry. Clean up any water that spills on the floor as soon as it happens. Remove soap buildup in the tub or shower regularly. Attach bath mats securely with double-sided non-slip rug tape. Do not have throw rugs and other things on the floor that can make you trip. What can I do in the bedroom? Use night lights. Make sure that you have a light by your bed that is easy to reach. Do not use any sheets or blankets that are too big for your bed. They should not hang down onto the floor. Have a firm chair that has side arms. You can use this for support while you get dressed. Do not have throw rugs and other things on the floor that can make  you trip. What can I do in the kitchen? Clean up any spills right away. Avoid walking on wet floors. Keep items that you use a lot in easy-to-reach places. If you need to reach something above you, use a strong step stool that has a grab bar. Keep electrical cords out of the way. Do not use floor polish or wax that makes floors slippery. If you must use wax, use non-skid floor wax. Do not have throw rugs and other things on the floor that can make you trip. What can I do with my stairs? Do not leave any items on the stairs. Make sure that there are handrails on both sides of the stairs and use them. Fix handrails that are broken or loose. Make sure that handrails are as long as the stairways. Check any carpeting to make sure that it is firmly attached to the stairs. Fix any carpet that is loose or worn. Avoid having throw rugs at the top or bottom of the stairs. If you do have throw rugs, attach them to the floor with carpet tape. Make sure that you have a light  switch at the top of the stairs and the bottom of the stairs. If you do not have them, ask someone to add them for you. What else can I do to help prevent falls? Wear shoes that: Do not have high heels. Have rubber bottoms. Are comfortable and fit you well. Are closed at the toe. Do not wear sandals. If you use a stepladder: Make sure that it is fully opened. Do not climb a closed stepladder. Make sure that both sides of the stepladder are locked into place. Ask someone to hold it for you, if possible. Clearly mark and make sure that you can see: Any grab bars or handrails. First and last steps. Where the edge of each step is. Use tools that help you move around (mobility aids) if they are needed. These include: Canes. Walkers. Scooters. Crutches. Turn on the lights when you go into a dark area. Replace any light bulbs as soon as they burn out. Set up your furniture so you have a clear path. Avoid moving your furniture around. If any of your floors are uneven, fix them. If there are any pets around you, be aware of where they are. Review your medicines with your doctor. Some medicines can make you feel dizzy. This can increase your chance of falling. Ask your doctor what other things that you can do to help prevent falls. This information is not intended to replace advice given to you by your health care provider. Make sure you discuss any questions you have with your health care provider. Document Released: 02/09/2009 Document Revised: 09/21/2015 Document Reviewed: 05/20/2014 Elsevier Interactive Patient Education  2017 [employer].

## 2022-05-07 NOTE — Progress Notes (Signed)
Subjective:   Emma Schneider is a 72 y.o. female who presents for Medicare Annual (Subsequent) preventive examination.  Review of Systems     Cardiac Risk Factors include: advanced age (>26mn, >>31women)     Objective:    Today's Vitals   05/07/22 1319  BP: 120/60  Pulse: 62  Temp: 97.9 F (36.6 C)  TempSrc: Oral  SpO2: 95%  Weight: 101 lb 1.6 oz (45.9 kg)  Height: '5\' 3"'$  (1.6 m)   Body mass index is 17.91 kg/m.     05/07/2022    1:31 PM 11/20/2021    7:50 AM 05/01/2021   10:03 AM 04/18/2020    9:45 AM  Advanced Directives  Does Patient Have a Medical Advance Directive? Yes Yes Yes Yes  Type of AParamedicof [place] will [place] will [place] will Living will;Healthcare Power of [place] Chart? No - copy requested  No - copy requested No - copy requested    Current Medications (verified) Outpatient Encounter Medications as of 05/07/2022  Medication Sig   atorvastatin (LIPITOR) 10 MG tablet Take 1 tablet (10 mg total) by mouth daily.   calcium carbonate (OS-CAL) 600 MG TABS tablet Take 1,200 mg by mouth daily.   Cholecalciferol (VITAMIN D3) 25 MCG (1000 UT) CAPS    Cholecalciferol 3000 units TABS Take 3,000 Units by mouth daily.   donepezil (ARICEPT) 10 MG tablet Take 1 tablet (10 mg total) by mouth at bedtime.   ferrous sulfate 324 MG TBEC Take 324 mg by mouth.   Ferrous Sulfate Dried (FEOSOL) 200 (65 Fe) MG TABS See admin instructions.   levothyroxine (SYNTHROID) 25 MCG tablet Take 1 tablet (25 mcg total) by mouth daily before breakfast.   mirtazapine (REMERON) 15 MG tablet Take 1 tablet (15 mg total) by mouth at bedtime.   Zoledronic Acid (ZOMETA) 4 MG/100ML IVPB 100 ml over 15 minutes   No facility-administered encounter medications on file as of 05/07/2022.    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 MLaurance Flatten  Aortic atherosclerosis    Chicken pox    Dementia likely due to Alzheimer's disease 11/06/2021   History of antineoplastic chemotherapy 07/24/2015   Hyperlipidemia    Microscopic hematuria 10/17/2014   Osteopenia    now OSTEOPOROSIS, dexa 2017- R Femur -2.2 was on Actonel prior so FRAX not appropriate but it was reported at major 10.9% and hip 3.1%- dexa ordered 2019, never had done, but HPI reported pt had dexa 2019 at last annual so not ordered. Will repeat dexa this year and Rx risidronate in the mean time. Asked pt to find out from PCP if they had prescribed Actonel prior so we can determine how many year   Personal history of colonic polyps 09/27/2016   3 polyps removed, repeated 02/2020, due 3 yrs   Thyroid dysfunction 05/11/2019   dx 02/2019, started on Euthyrox   Tobacco use 01/06/2015   Vitamin D deficiency    History reviewed. No pertinent surgical history. Family History  Problem Relation Age of Onset   Congestive Heart Failure Mother    Dementia Mother        late in  life   Heart attack Father    Heart disease Father    Social History   Socioeconomic History   Marital status: Married    Spouse name: Not on file   Number of children: Not on file   Years of education: 13   Highest education level: Some college, no degree  Occupational History   Occupation: Retired    Comment: Art therapist  Tobacco Use   Smoking status: Every Day    Packs/day: 1.00    Years: 42.00    Total pack years: 42.00    Types: Cigarettes   Smokeless tobacco: Never   Tobacco comments:    Is actively trying to cut down number of cigarettes, but not ready to set a quit date. 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    -Married for 30 years    - Has step children, all live in [place]   - No pets   - Plays games on the computer and enjoys yard work.    Two story home   Caffeine a lot of pepsi   Social Determinants of Health   Financial Resource Strain: Low Risk  (05/07/2022)   Overall Financial Resource Strain (CARDIA)    Difficulty of Paying Living Expenses: Not hard at all  Food Insecurity: No Food Insecurity (05/07/2022)   Hunger Vital Sign    Worried About Running Out of Food in the Last Year: Never true    Ran Out of Food in the Last Year: Never true  Transportation Needs: No Transportation Needs (05/07/2022)   PRAPARE - Hydrologist (Medical): No    Lack of Transportation (Non-Medical): No  Physical Activity: Inactive (05/07/2022)   Exercise Vital Sign    Days of Exercise per Week: 0 days    Minutes of Exercise per Session: 0 min  Stress: No Stress Concern Present (05/07/2022)   [place]    Feeling of Stress : Not at all  Social Connections: [place] (05/07/2022)   Social Connection and Isolation Panel [NHANES]    Frequency of Communication with Friends and Family: More than three times a week    Frequency of Social Gatherings with Friends and Family: More than three times a week    Attends Religious Services: More than 4 times per year    Active Member of [employer] or Organizations: Yes    Attends Music therapist: More than 4 times per year    Marital Status: Married    Tobacco Counseling Ready to quit: No Counseling given: Yes Tobacco comments: Is actively trying to cut down number of cigarettes, but not ready to set a quit date. Smokes a pack a  day   Clinical Intake:  Pre-visit preparation completed: No  Pain : No/denies pain     BMI - recorded: 17.91 Nutritional Status: BMI <19  Underweight Nutritional Risks: None Diabetes: No  How often do you need to have someone help you when you read instructions, pamphlets, or other written materials from your doctor or pharmacy?: 1 - Never  Diabetic?  No  Interpreter Needed?:  No  Information entered by :: Rolene Arbour LPN   Activities of Daily Living    05/07/2022    1:29 PM  In your present state of health, do you have any difficulty performing the following activities:  Hearing? 0  Vision? 0  Difficulty concentrating or making decisions? 0  Walking or climbing stairs? 0  Dressing or bathing? 0  Doing errands, shopping? 0  Preparing Food and eating ? N  Using the Toilet? N  In the past six months, have you accidently leaked urine? N  Do you have problems with loss of bowel control? N  Managing your Medications? N  Managing your Finances? N  Housekeeping or managing your Housekeeping? N    Patient Care Team: Dorothyann Peng, NP as PCP - General (Family Medicine) Irine Seal, MD as Attending Physician (Urology) Sabas Sous, MD as Referring Physician (Internal Medicine) Preminger, Adrienne Mocha, MD as Referring Physician (Urology) Allyn Kenner, DO as Consulting Physician (Obstetrics and Gynecology)  Indicate any recent Medical Services you may have received from other than [facility] providers in the past year (date may be approximate).     Assessment:   This is a routine wellness examination for Emma Schneider.  Hearing/Vision screen Hearing Screening - Comments:: Denies hearing difficulties   Vision Screening - Comments:: Wears rx glasses - up to date with routine eye exams with  Deferred  Dietary issues and exercise activities discussed: Exercise limited by: None identified   Goals Addressed               This Visit's Progress     Stay Healthy (pt-stated)          Depression Screen    05/07/2022    1:29 PM 05/01/2021   10:03 AM 04/18/2020    9:44 AM 02/10/2020    8:03 AM 10/02/2016    1:52 PM 01/06/2015   11:23 AM 09/20/2014    2:19 PM  PHQ 2/9 Scores  PHQ - 2 Score 0 0 0 0 0 0 0    Fall Risk    05/07/2022    1:30 PM 11/20/2021    7:50 AM 05/01/2021   10:03 AM 04/18/2020    9:47 AM 02/10/2020    8:03 AM  Fall Risk   Falls in the past year? 0 1 0 0 0  Number falls in past yr: 0 0  0 0  Injury with Fall? 0 0  0   Risk for fall due to : No Fall Risks  Medication side effect Impaired vision   Follow up Falls prevention discussed  Falls evaluation completed;Education provided;Falls prevention discussed Falls prevention discussed     FALL RISK PREVENTION PERTAINING TO THE HOME:  Any stairs in or around the home? Yes  If so, are there any without handrails? No  Home free of loose throw rugs in walkways, pet beds, electrical cords, etc? Yes  Adequate lighting in your home to reduce risk of falls? Yes   ASSISTIVE DEVICES UTILIZED TO PREVENT FALLS:  Life alert? No  Use of a cane, walker or w/c? No  Grab bars in the bathroom? Yes  Shower chair or bench in shower? No  Elevated toilet seat or a handicapped toilet? Yes   TIMED UP AND GO:  Was the test performed? Yes .  Length of time to ambulate 10 feet: 10 sec.   Gait steady and fast without use of assistive device  Cognitive Function:        05/07/2022  1:32 PM 05/01/2021   10:06 AM 04/18/2020    9:53 AM  6CIT Screen  What Year? 0 points 0 points 0 points  What month? 0 points 0 points 0 points  What time? 0 points 0 points   Count back from 20 0 points 0 points 0 points  Months in reverse 2 points 2 points 4 points  Repeat phrase 8 points 6 points 2 points  Total Score 10 points 8 points     Immunizations Immunization History  Administered Date(s) Administered   Fluad Quad(high Dose 65+) 01/19/2019, 01/13/2020, 02/13/2021, 01/23/2022   Hepatitis B 06/27/2008   Influenza  Inj Mdck Quad With Preservative 01/27/2018, 02/28/2019   Influenza, High Dose Seasonal PF 02/10/2015, 03/20/2016, 01/21/2017, 02/09/2018   Influenza-Unspecified 02/28/2016, 12/28/2016   PFIZER(Purple Top)SARS-COV-2 Vaccination 06/05/2019, 06/26/2019, 01/26/2020   Pneumococcal Conjugate-13 02/10/2015   Pneumococcal Polysaccharide-23 03/20/2016   Pneumococcal-Unspecified 02/28/2016   Tdap 09/20/2014   Zoster, Live 01/03/2015    TDAP status: Up to date  Flu Vaccine status: Up to date  Pneumococcal vaccine status: Up to date  Covid-19 vaccine status: Completed vaccines  Qualifies for Shingles Vaccine? Yes   Zostavax completed No   Shingrix Completed?: No.    Education has been provided regarding the importance of this vaccine. Patient has been advised to call insurance company to determine out of pocket expense if they have not yet received this vaccine. Advised may also receive vaccine at local pharmacy or Health Dept. Verbalized acceptance and understanding.  Screening Tests Health Maintenance  Topic Date Due   COVID-19 Vaccine (4 - 2023-24 season) 05/23/2022 (Originally 12/28/2021)   Zoster Vaccines- Shingrix (1 of 2) 08/06/2022 (Originally 12/28/1969)   MAMMOGRAM  05/08/2023 (Originally 05/10/2021)   Lung Cancer Screening  11/15/2022   Medicare Annual Wellness (AWV)  05/08/2023   DTaP/Tdap/Td (2 - Td or Tdap) 09/19/2024   COLONOSCOPY (Pts 45-22yr Insurance coverage will need to be confirmed)  10/26/2026   Pneumonia Vaccine 72 Years old  Completed   INFLUENZA VACCINE  Completed   DEXA SCAN  Completed   Hepatitis C Screening  Completed   HPV VACCINES  Aged Out    Health Maintenance  There are no preventive care reminders to display for this patient.   Colorectal cancer screening: Type of screening: Colonoscopy. Completed 10/25/16. Repeat every 10 years  Mammogram status: Ordered Patient deferred. Pt provided with contact info and advised to call to schedule appt.   Bone  Density status: Completed 10/27/20. Results reflect: Bone density results: OSTEOPOROSIS. Repeat every   years.  Lung Cancer Screening: (Low Dose CT Chest recommended if Age 73-80years, 30 pack-year currently smoking OR have quit w/in 15years.)  qualify.     Additional Screening:  Hepatitis C Screening: does qualify; Completed 01/06/15  Vision Screening: Recommended annual ophthalmology exams for early detection of glaucoma and other disorders of the eye. Is the patient up to date with their annual eye exam?  Yes  Who is the provider or what is the name of the office in which the patient attends annual eye exams? deferred If pt is not established with a provider, would they like to be referred to a provider to establish care? No .   Dental Screening: Recommended annual dental exams for proper oral hygiene  Community Resource Referral / Chronic Care Management:  CRR required this visit?  No   CCM required this visit?  No      Plan:     I have personally reviewed and  noted the following in the patient's chart:   Medical and social history Use of alcohol, tobacco or illicit drugs  Current medications and supplements including opioid prescriptions. Patient is not currently taking opioid prescriptions. Functional ability and status Nutritional status Physical activity Advanced directives List of other physicians Hospitalizations, surgeries, and ER visits in previous 12 months Vitals Screenings to include cognitive, depression, and falls Referrals and appointments  In addition, I have reviewed and discussed with patient certain preventive protocols, quality metrics, and best practice recommendations. A written personalized care plan for preventive services as well as general preventive health recommendations were provided to patient.     Criselda Peaches, LPN   [id number]   Nurse Notes: None

## 2022-05-23 ENCOUNTER — Encounter: Payer: Self-pay | Admitting: Physician Assistant

## 2022-05-23 ENCOUNTER — Ambulatory Visit: Payer: Medicare PPO | Admitting: Physician Assistant

## 2022-05-23 VITALS — BP 120/72 | HR 104 | Resp 18 | Ht 63.0 in | Wt 99.0 lb

## 2022-05-23 DIAGNOSIS — G309 Alzheimer's disease, unspecified: Secondary | ICD-10-CM | POA: Diagnosis not present

## 2022-05-23 DIAGNOSIS — F0280 Dementia in other diseases classified elsewhere without behavioral disturbance: Secondary | ICD-10-CM

## 2022-05-23 MED ORDER — MEMANTINE HCL 10 MG PO TABS: ORAL_TABLET | ORAL | 11 refills | Status: DC

## 2022-05-23 NOTE — Patient Instructions (Addendum)
It was a pleasure to see you today at our office.   Recommendations:  Follow up in  6 months Continue donepezil to 10 mg daily. Side effects were discussed   Start Memantine 10 mg: Take 1 tablet (5 mg at night) for 2 weeks, then increase to 1 tablet (10 mg)at night  Discontinue smoking   Whom to call:  Memory  decline, memory medications: Call our office [phone]   For psychiatric meds, mood meds: Please have your primary care physician manage these medications.   Counseling regarding caregiver distress, including caregiver depression, anxiety and issues regarding community resources, adult day care programs, adult living facilities, or memory care questions:   Feel free to contact [place], Social Worker at [phone]   For assessment of decision of mental capacity and competency:  Call Dr. Anthoney Harada, geriatric psychiatrist at [phone]  For guidance in geriatric dementia issues please call [facility] [phone]  For guidance regarding [facility] and if placement were needed at the facility, contact Arnell Asal, Social Worker tel: [phone]  If you have any severe symptoms of a stroke, or other severe issues such as confusion,severe chills or fever, etc call 911 or go to the ER as you may need to be evaluated further   Feel free to visit Facebook page " [employer]" for tips of how to care for people with memory problems.   Feel free to go to the following database for funded clinical studies conducted around the world: http://saunders.com/       RECOMMENDATIONS FOR ALL PATIENTS WITH MEMORY PROBLEMS: 1. Continue to exercise (Recommend 30 minutes of walking everyday, or 3 hours every week) 2. Increase social interactions - continue going to [place] and enjoy social gatherings with friends and family 3. Eat healthy, avoid fried foods and eat more fruits and vegetables 4. Maintain adequate blood pressure, blood  sugar, and blood cholesterol level. Reducing the risk of stroke and cardiovascular disease also helps promoting better memory. 5. Avoid stressful situations. Live a simple life and avoid aggravations. Organize your time and prepare for the next day in anticipation. 6. Sleep well, avoid any interruptions of sleep and avoid any distractions in the bedroom that may interfere with adequate sleep quality 7. Avoid sugar, avoid sweets as there is a strong link between excessive sugar intake, diabetes, and cognitive impairment We discussed the Mediterranean diet, which has been shown to help patients reduce the risk of progressive memory disorders and reduces cardiovascular risk. This includes eating fish, eat fruits and green leafy vegetables, nuts like almonds and hazelnuts, walnuts, and also use olive oil. Avoid fast foods and fried foods as much as possible. Avoid sweets and sugar as sugar use has been linked to worsening of memory function.  There is always a concern of gradual progression of memory problems. If this is the case, then we may need to adjust level of care according to patient needs. Support, both to the patient and caregiver, should then be put into place.    The Alzheimer's Association is here all day, every day for people facing Alzheimer's disease through our free 24/7 Helpline: [phone]. The Helpline provides reliable information and support to all those who need assistance, such as individuals living with memory loss, Alzheimer's or other dementia, caregivers, health care professionals and the public.  Our highly trained and knowledgeable staff can help you with: Understanding memory loss, dementia and Alzheimer's  Medications and other treatment options  General information about aging  and brain health  Skills to provide quality care and to find the best care from professionals  Legal, financial and living-arrangement decisions Our Helpline also features: Confidential care  consultation provided by master's level clinicians who can help with decision-making support, crisis assistance and education on issues families face every day  Help in a caller's preferred language using our translation service that features more than 200 languages and dialects  Referrals to local community programs, services and ongoing support     FALL PRECAUTIONS: Be cautious when walking. Scan the area for obstacles that may increase the risk of trips and falls. When getting up in the mornings, sit up at the edge of the bed for a few minutes before getting out of bed. Consider elevating the bed at the head end to avoid drop of blood pressure when getting up. Walk always in a well-lit room (use night lights in the walls). Avoid area rugs or power cords from appliances in the middle of the walkways. Use a walker or a cane if necessary and consider physical therapy for balance exercise. Get your eyesight checked regularly.  FINANCIAL OVERSIGHT: Supervision, especially oversight when making financial decisions or transactions is also recommended.  HOME SAFETY: Consider the safety of the kitchen when operating appliances like stoves, microwave oven, and blender. Consider having supervision and share cooking responsibilities until no longer able to participate in those. Accidents with firearms and other hazards in the house should be identified and addressed as well.   ABILITY TO BE LEFT ALONE: If patient is unable to contact 911 operator, consider using LifeLine, or when the need is there, arrange for someone to stay with patients. Smoking is a fire hazard, consider supervision or cessation. Risk of wandering should be assessed by caregiver and if detected at any point, supervision and safe proof recommendations should be instituted.  MEDICATION SUPERVISION: Inability to self-administer medication needs to be constantly addressed. Implement a mechanism to ensure safe administration of the  medications.     Mediterranean Diet A Mediterranean diet refers to food and lifestyle choices that are based on the traditions of countries located on the [employer]. This way of eating has been shown to help prevent certain conditions and improve outcomes for people who have chronic diseases, like kidney disease and heart disease. What are tips for following this plan? Lifestyle  Cook and eat meals together with your family, when possible. Drink enough fluid to keep your urine clear or pale yellow. Be physically active every day. This includes: Aerobic exercise like running or swimming. Leisure activities like gardening, walking, or housework. Get 7-8 hours of sleep each night. If recommended by your health care provider, drink red wine in moderation. This means 1 glass a day for nonpregnant women and 2 glasses a day for men. A glass of wine equals 5 oz (150 mL). Reading food labels  Check the serving size of packaged foods. For foods such as rice and pasta, the serving size refers to the amount of cooked product, not dry. Check the total fat in packaged foods. Avoid foods that have saturated fat or trans fats. Check the ingredients list for added sugars, such as corn syrup. Shopping  At the grocery store, buy most of your food from the areas near the walls of the store. This includes: Fresh fruits and vegetables (produce). Grains, beans, nuts, and seeds. Some of these may be available in unpackaged forms or large amounts (in bulk). Fresh seafood. Poultry and eggs. Low-fat dairy products.  Buy whole ingredients instead of prepackaged foods. Buy fresh fruits and vegetables in-season from local farmers markets. Buy frozen fruits and vegetables in resealable bags. If you do not have access to quality fresh seafood, buy precooked frozen shrimp or canned fish, such as tuna, salmon, or sardines. Buy small amounts of raw or cooked vegetables, salads, or olives from the deli or salad  bar at your store. Stock your pantry so you always have certain foods on hand, such as olive oil, canned tuna, canned tomatoes, rice, pasta, and beans. Cooking  Cook foods with extra-virgin olive oil instead of using butter or other vegetable oils. Have meat as a side dish, and have vegetables or grains as your main dish. This means having meat in small portions or adding small amounts of meat to foods like pasta or stew. Use beans or vegetables instead of meat in common dishes like chili or lasagna. Experiment with different cooking methods. Try roasting or broiling vegetables instead of steaming or sauteing them. Add frozen vegetables to soups, stews, pasta, or rice. Add nuts or seeds for added healthy fat at each meal. You can add these to yogurt, salads, or vegetable dishes. Marinate fish or vegetables using olive oil, lemon juice, garlic, and fresh herbs. Meal planning  Plan to eat 1 vegetarian meal one day each week. Try to work up to 2 vegetarian meals, if possible. Eat seafood 2 or more times a week. Have healthy snacks readily available, such as: Vegetable sticks with hummus. Greek yogurt. Fruit and nut trail mix. Eat balanced meals throughout the week. This includes: Fruit: 2-3 servings a day Vegetables: 4-5 servings a day Low-fat dairy: 2 servings a day Fish, poultry, or lean meat: 1 serving a day Beans and legumes: 2 or more servings a week Nuts and seeds: 1-2 servings a day Whole grains: 6-8 servings a day Extra-virgin olive oil: 3-4 servings a day Limit red meat and sweets to only a few servings a month What are my food choices? Mediterranean diet Recommended Grains: Whole-grain pasta. Brown rice. Bulgar wheat. Polenta. Couscous. Whole-wheat bread. [place]. Vegetables: Artichokes. Beets. Broccoli. Cabbage. Carrots. Eggplant. Green beans. Chard. Kale. Spinach. Onions. Leeks. Peas. Squash. Tomatoes. Peppers. Radishes. Fruits: Apples. Apricots. Avocado. Berries.  Bananas. Cherries. Dates. Figs. Grapes. Lemons. Melon. Oranges. Peaches. Plums. Pomegranate. Meats and other protein foods: Beans. Almonds. Sunflower seeds. Pine nuts. Peanuts. [place]. Salmon. Scallops. Shrimp. [place]. Tilapia. Clams. Oysters. Eggs. Dairy: Low-fat milk. Cheese. Greek yogurt. Beverages: Water. Red wine. Herbal tea. Fats and oils: Extra virgin olive oil. Avocado oil. Grape seed oil. Sweets and desserts: [place] yogurt with honey. Baked apples. Poached pears. Trail mix. Seasoning and other foods: Basil. Cilantro. Coriander. Cumin. Mint. Parsley. Sage. Rosemary. Tarragon. Garlic. Oregano. Thyme. Pepper. Balsalmic vinegar. Tahini. Hummus. Tomato sauce. Olives. Mushrooms. Limit these Grains: Prepackaged pasta or rice dishes. Prepackaged cereal with added sugar. Vegetables: Deep fried potatoes (french fries). Fruits: Fruit canned in syrup. Meats and other protein foods: Beef. Pork. Lamb. Poultry with skin. Hot dogs. [place] Salines. Dairy: Ice cream. Sour cream. Whole milk. Beverages: Juice. Sugar-sweetened soft drinks. Beer. Liquor and spirits. Fats and oils: Butter. Canola oil. Vegetable oil. Beef fat (tallow). Lard. Sweets and desserts: Cookies. Cakes. Pies. Candy. Seasoning and other foods: Mayonnaise. Premade sauces and marinades. The items listed may not be a complete list. Talk with your dietitian about what dietary choices are right for you. Summary The Mediterranean diet includes both food and lifestyle choices. Eat a variety of fresh fruits and vegetables, beans,  nuts, seeds, and whole grains. Limit the amount of red meat and sweets that you eat. Talk with your health care provider about whether it is safe for you to drink red wine in moderation. This means 1 glass a day for nonpregnant women and 2 glasses a day for men. A glass of wine equals 5 oz (150 mL). This information is not intended to replace advice given to you by your health care provider. Make sure you discuss any questions you  have with your health care provider. Document Released: 12/07/2015 Document Revised: 01/09/2016 Document Reviewed: 12/07/2015 Elsevier Interactive Patient Education  2017 [employer].

## 2022-05-23 NOTE — Progress Notes (Signed)
Assessment/Plan:   Dementia likely due to Alzheimer's Disease   Emma Schneider is a very pleasant 73 y.o. 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 Patient is currently on donepezil 10 mg daily, tolerating well.  Prior MRI brain January 2023 personally reviewed was remarkable for generalized atrophy, and mild to moderate microvascular ischemic changes. MMSE today is 21/30. She reports that her memory is slightly worse from prior, however, she is not as active as before or participating in social activities, social stimulation . She is able to do simple ADLs. She no longer drives.       Follow up in 6  months. Continue donepezil 10 mg daily. Side effects were discussed   Start Memantine 10 mg: Take 1/2 tablet (5 mg at night) for 2 weeks, then increase to 1 tablet (5 mg) at night, side effects discussed  Repeat neuropsychological evaluation in 18 months, for clarity of the diagnosis and disease trajectory. Continue to control mood as per PCP, continue mirtazapine at night  Continue to have good control of cardiovascular risk factors Increase social activities , consider attending [place] smoking    Subjective:    This patient is accompanied in the office by her husband who supplements the history.  Previous records as well as any outside records available were reviewed prior to todays visit. Patient was last seen on 11/20/2021    Any changes in memory since last visit?  "Not good".  "I am more aware of it because he makes me more aware of it" . Patient has some difficulty remembering recent conversations and people names if she just meets them.  Tries to avoid conversations because she is "trying to do everything right and cannot".  She is unable to do crossword puzzles and word finding. repeats  oneself? Denies  Disoriented when walking into a room?  Patient denies   Leaving objects in unusual places? "Salad fork in the fork drawer but nothing major"  Wandering behavior?  denies   Any personality changes since last visit?  "Most people notice that she cannot have a conversation any longer, she lets other people do the talking, which affects her mood" Any worsening depression?: "Only when she realizes that she did something she is down on herself"  Hallucinations or paranoia?  denies   Seizures?    denies    Any sleep changes?  She sleeps well per husband report. During the day she also sleeps because she does not participate in activities.  Occasionally she talks in her sleep, one episode of sleepwalking. No other REM behavior  Sleep apnea?   denies   Any hygiene concerns?  denies   Independent of bathing and dressing?  Endorsed  Does the patient needs help with medications? Husband places in the box but she is independent of taking her meds  Who is in charge of the finances?  Husband is in charge    Any changes in appetite?  "Eats all the time" Patient have trouble swallowing?  denies   Does the patient cook? No     Any headaches?   denies   Chronic back pain  denies   Ambulates with difficulty? Denies, occasionally she is wobbly in the morning. Recent falls or head injuries? denies     Unilateral weakness, numbness or tingling? denies  Any tremors?  denies   Any anosmia?  Patient denies   Any incontinence of urine?  denies   Any bowel dysfunction?   denies      Patient lives  with husband  Does the patient drive? No  Continues to smoke   Initial evaluation on 11/15/2021   How long did patient have memory difficulties?  Her family report that the memory issues have been present for about 5 years worse since about Oct 2022. She also has greater difficulty with reading comprehension.  She has more difficulty with crossword puzzles and word finding.  Long-term memory is normal.  There are difficulties with multitasking, organization and impulsivity. Patient lives with: Spouse  repeats oneself? Endorsed "occasionally", although her husband says that this is more frequent than prior. Disoriented when walking into a room?  "It can be a scary situation if she is in a new environment".   Leaving objects in unusual places?  Patient denies   Ambulates  with difficulty?  "Getting a little more wobbly than before ". She has to think ahead if she is in uneven ground.   Recent falls?  Patient denies   Any head injuries?  Patient denies   History of seizures?   Patient denies   Wandering behavior?  Patient denies   Patient drives?  Her husband does not allow her to drive alone, and she only drives very short distances, preventing major intersections. Any mood changes?  Family report more impulsivity, she becomes irritable more quickly, especially when confronted.  She was taking Wellbutrin which was discontinued due to hallucinations. Any history of depression?:  Denies Hallucinations? Around 03/2021 she began having visual hallucinations, such as seeing dogs and cats under her porch, and animals roaming through the house, but after discontinuing Wellbutrin these hallucinations disappeared.  Paranoia? About 1 year ago it was reported the patient began accusing her husband of having affairs.  This has subsided. Patient reports that she sleeps well without vivid dreams, REM behavior or sleepwalking    History of sleep apnea?  Patient denies   Any hygiene concerns?  Patient denies   Independent of bathing and dressing?  Endorsed  Does the patient needs help with medications?  Husband is in charge Who is in charge of the finances?  Husband is in charge    Any changes in appetite?  Patient denies   Patient have trouble swallowing? Patient denies   Does the patient cook?  Patient denies .  "Has forgotten common recipes for the last year, has become more confused between the tsp and Tsp  " Any kitchen accidents such as leaving the stove on? Patient denies   Any headaches?  Only when stressed. Double vision? Patient denies   Any focal numbness or tingling?  Patient denies   Chronic back pain Patient denies   Unilateral weakness?  Patient denies   Any tremors?  Patient denies   Any history of anosmia?  Patient denies   Any incontinence of urine?  Patient denies   Any bowel dysfunction?   Patient denies      History of heavy alcohol intake?  Patient denies   History of heavy tobacco use?  Endorsed Family history of dementia?  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   Neuropsych evaluation 10/2021 Dr. Melvyn Novas  Briefly, results suggested primary impairments surrounding semantic fluency, confrontation naming, and essentially all aspects of learning and memory. Additional weaknesses were exhibited across complex attention, receptive language, and visuospatial abilities, while variability was  exhibited across processing speed, basic attention, and executive functioning. Regarding etiology, I unfortunately have concerns surrounding underlying Alzheimer's disease. Ms. Borys did not benefit from repeated exposure to information across learning trials, performed poorly across delayed recall trials, and performed poorly across yes/no recognition trials. Taken together, this suggests evidence for rapid forgetting and an evolving and already quite significant memory storage impairment, both of which are hallmark characteristics of this illness. Additional impairments across confrontation naming and semantic fluency align very well with this illness as they are the expected domains to exhibit impairment following early memory dysfunction. Visuospatial weakness also aligns well with typical disease progression. While I am certainly not able to ascertain the validity of her allegations surrounding her husband's behaviors, both her husband and their daughter denied these actions and the  emergence of suspicion and delusional thinking is certainly seen in individuals with Alzheimer's disease.   PREVIOUS MEDICATIONS:   CURRENT MEDICATIONS:  Outpatient Encounter Medications as of 05/23/2022  Medication Sig   atorvastatin (LIPITOR) 10 MG tablet Take 1 tablet (10 mg total) by mouth daily.   calcium carbonate (OS-CAL) 600 MG TABS tablet Take 1,200 mg by mouth daily.   Cholecalciferol (VITAMIN D3) 25 MCG (1000 UT) CAPS    Cholecalciferol 3000 units TABS Take 3,000 Units by mouth daily.   donepezil (ARICEPT) 10 MG tablet Take 1 tablet (10 mg total) by mouth at bedtime.   ferrous sulfate 324 MG TBEC Take 324 mg by mouth.   Ferrous Sulfate Dried (FEOSOL) 200 (65 Fe) MG TABS See admin instructions.   levothyroxine (SYNTHROID) 25 MCG tablet Take 1 tablet (25 mcg total) by mouth daily before breakfast.   memantine (NAMENDA) 10 MG tablet Take half tablet (5 mg) daily for 2 weeks, then increase one full tablet at 10 mg nightly   mirtazapine (REMERON) 15 MG tablet Take 1 tablet (15 mg total) by mouth at bedtime.   Zoledronic Acid (ZOMETA) 4 MG/100ML IVPB 100 ml over 15 minutes   No facility-administered encounter medications on file as of 05/23/2022.       05/23/2022    1:00 PM  MMSE - Mini Mental State Exam  Orientation to time 4  Orientation to Place 5  Registration 2  Attention/ Calculation 0  Recall 2  Language- name 2 objects 2  Language- repeat 1  Language- follow 3 step command 3  Language- read & follow direction 1  Write a sentence 1  Copy design 0  Total score 21       No data to display          Objective:     PHYSICAL EXAMINATION:    VITALS:   Vitals:   05/23/22 1248  BP: 120/72  Pulse: (!) 104  Resp: 18  SpO2: 96%  Weight: 99 lb (44.9 kg)  Height: '5\' 3"'$  (1.6 m)    GEN:  The patient appears stated age and is in NAD. HEENT:  Normocephalic, atraumatic.   Neurological examination:  General: NAD, well-groomed, appears stated age. Orientation:  The patient is alert.  Anxious appearing. Oriented to person, place and date except for the year (44) Cranial nerves: There is good facial symmetry.The speech is fluent and clear, sometimes hesitant. No aphasia or dysarthria. Fund of knowledge is appropriate. Recent and remote memory impaired.  Attention and concentration are normal.  Able to name objects and repeat phrases.  Hearing is slightly reduced to conversational tone.    Sensation: Sensation is intact to light touch throughout Motor: Strength  is at least antigravity x4. DTR's 2/4 in UE/LE     Movement examination: Tone: There is normal tone in the UE/LE Abnormal movements:  no tremor.  No myoclonus.  No asterixis.   Coordination:  There is no decremation with RAM's. Normal finger to nose  Gait and Station: The patient has no difficulty arising out of a deep-seated chair without the use of the hands. The patient's stride length is good.  Gait is cautious and narrow.    Thank you for allowing [place] the opportunity to participate in the care of this nice patient. Please do not hesitate to contact us for any questions or concerns.   Total time spent on today's visit was 44 minutes dedicated to this patient today, preparing to see patient, examining the patient, ordering tests and/or medications and counseling the patient, documenting clinical information in the EHR or other health record, independently interpreting results and communicating results to the patient/family, discussing treatment and goals, answering patient's questions and coordinating care.  Cc:  Dorothyann Peng, NP  Sharene Butters 05/23/2022 1:56 PM

## 2022-06-11 DIAGNOSIS — Z1231 Encounter for screening mammogram for malignant neoplasm of breast: Secondary | ICD-10-CM | POA: Diagnosis not present

## 2022-07-22 IMAGING — CT CT CHEST LUNG CANCER SCREENING LOW DOSE W/O CM
1 of 3 series · 10 of 30 positions shown, 13 images · non-contrast
Comparison: 06/03/2019.

CLINICAL DATA: Current smoker, 47 pack-year history.

EXAM:
CT CHEST WITHOUT CONTRAST LOW-DOSE FOR LUNG CANCER SCREENING
TECHNIQUE: Multidetector CT imaging of the chest was performed following the
standard protocol without IV contrast.

[ct lung segmentation data · axial · 0.56mm/px · z∈[-354,-354]mm · 10 of 344 frames shown]
[frame 1/344  mediastinal]
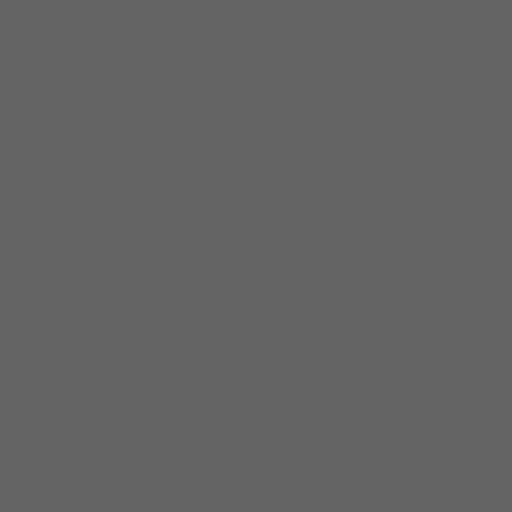
[frame 1/344  lung]
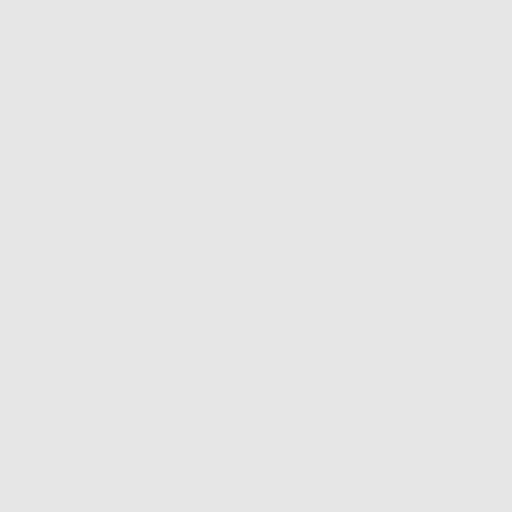
[frame 39/344  lung]
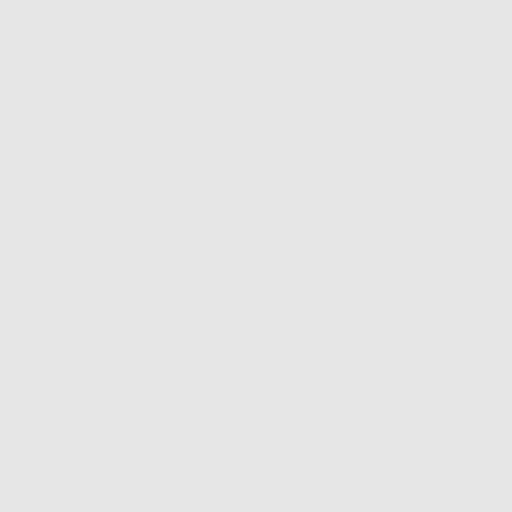
[frame 77/344  lung]
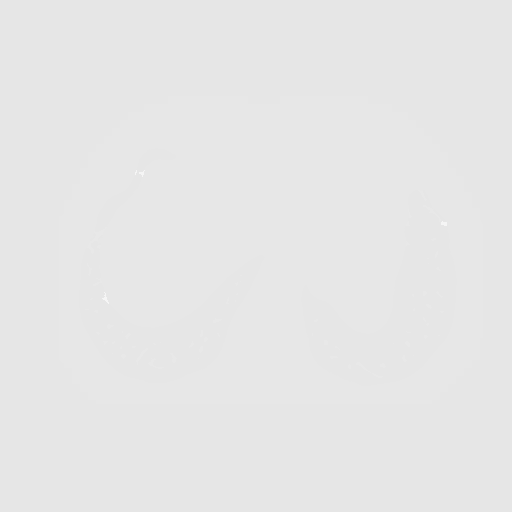
[frame 115/344  lung]
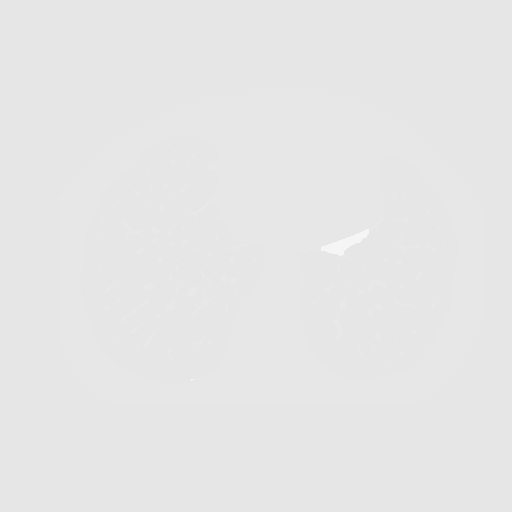
[frame 153/344  mediastinal]
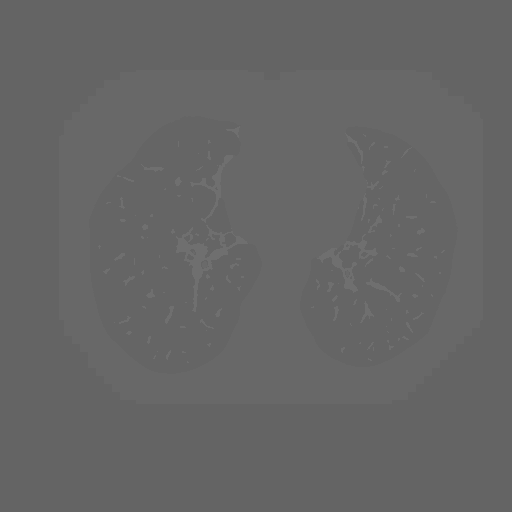
[frame 153/344  lung]
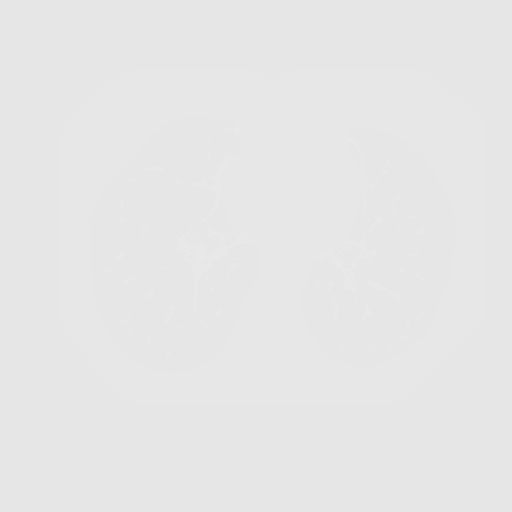
[frame 191/344  lung]
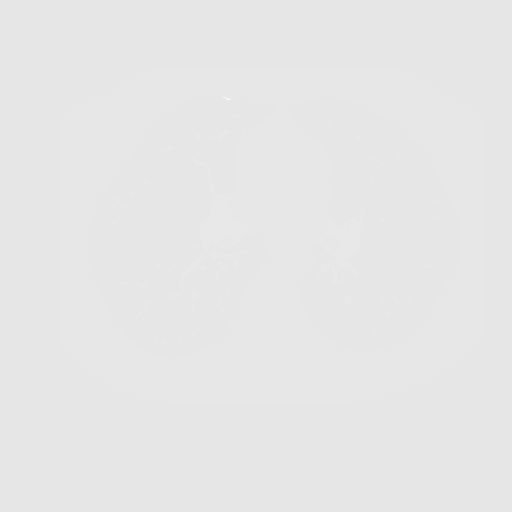
[frame 229/344  lung]
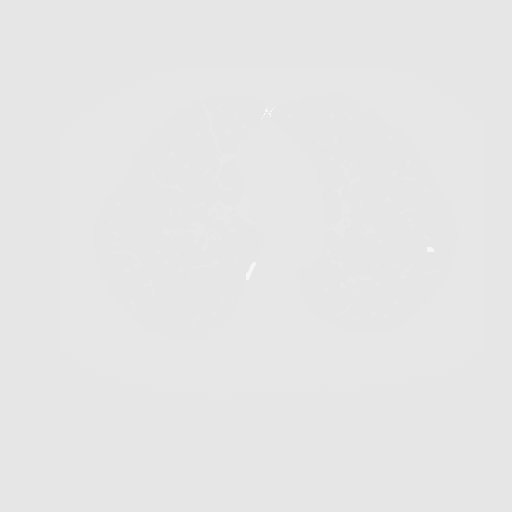
[frame 267/344  lung]
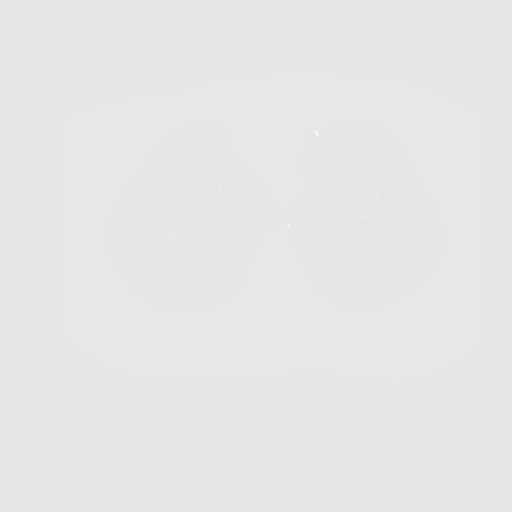
[frame 305/344  mediastinal]
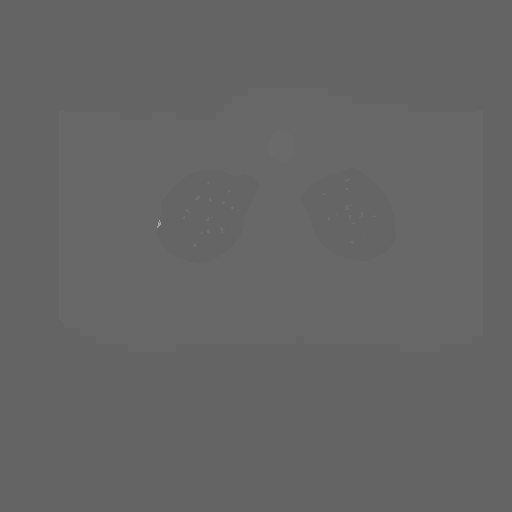
[frame 305/344  lung]
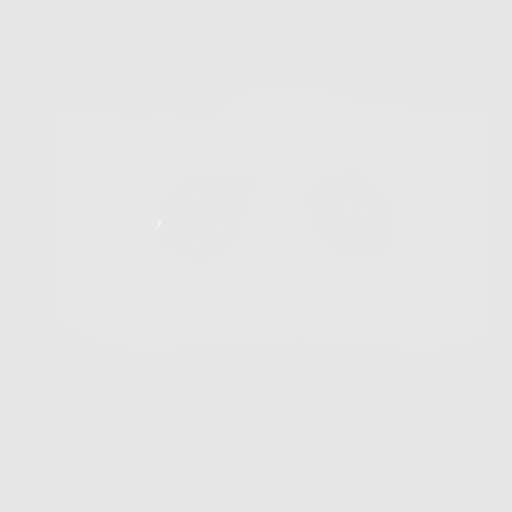
[frame 344/344  lung]
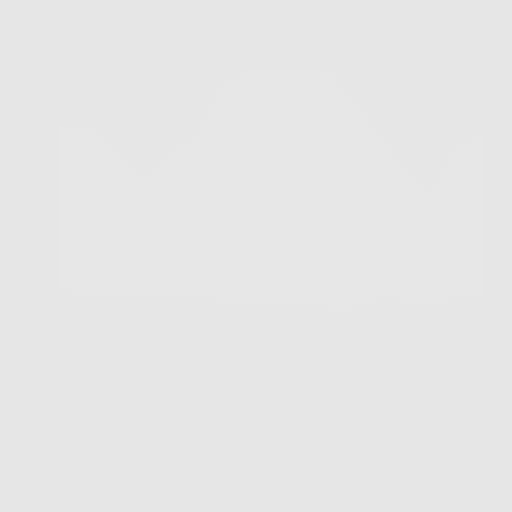

[10 of 30 positions shown; findings below may reference images not displayed]

FINDINGS: Cardiovascular: Atherosclerotic calcification of the aorta, aortic
valve and coronary arteries. Heart size normal. No pericardial
effusion.

Mediastinum/Nodes: No pathologically enlarged mediastinal or
axillary lymph nodes. Hilar regions are difficult to definitively
evaluate without IV contrast but appear grossly unremarkable.
Esophagus is grossly unremarkable.

Lungs/Pleura: Biapical pleuroparenchymal scarring. Centrilobular
emphysema. Scattered pulmonary parenchymal scarring. Pulmonary
nodules measure 4.4 mm or less in size, as before. No new or
suspicious pulmonary nodules. No pleural fluid. Airway is
unremarkable.

Upper Abdomen: Visualized portion of the liver is unremarkable.
cm fluid density nodule in the right adrenal gland. Visualized
portions of the left adrenal gland, kidneys, spleen, pancreas and
stomach are otherwise unremarkable.

Musculoskeletal: Mild degenerative changes in the spine. No
worrisome lytic or sclerotic lesions
IMPRESSION: 1. Lung-RADS 2, benign appearance or behavior. Continue annual
screening with low-dose chest CT without contrast in 12 months.
2. Right adrenal adenoma.
3. Aortic atherosclerosis (WY112-7KR.R). Coronary artery
calcification.
4.  Emphysema ([id number]).

## 2022-10-11 ENCOUNTER — Other Ambulatory Visit: Payer: Self-pay | Admitting: Acute Care

## 2022-10-11 DIAGNOSIS — Z122 Encounter for screening for malignant neoplasm of respiratory organs: Secondary | ICD-10-CM

## 2022-10-11 DIAGNOSIS — F17210 Nicotine dependence, cigarettes, uncomplicated: Secondary | ICD-10-CM

## 2022-10-11 DIAGNOSIS — Z87891 Personal history of nicotine dependence: Secondary | ICD-10-CM

## 2022-10-14 ENCOUNTER — Telehealth: Payer: Self-pay | Admitting: Adult Health

## 2022-10-14 NOTE — Telephone Encounter (Signed)
Requesting a prescription for Ferrous Sulfate Dried (FEOSOL) 200 (65 Fe) MG TABS calcium carbonate (OS-CAL) 600 MG TABS tablet, not sure she needs to be taking Cholecalciferol (VITAMIN D3) 25 MCG (1000 UT) CAPS   [facility] - [place], [place] - [address] Phone: [phone]  Fax: [phone]

## 2022-10-15 NOTE — Telephone Encounter (Signed)
Ok to fill? None have been filled by you

## 2022-10-15 NOTE — Telephone Encounter (Signed)
Pt returning call. Let pt know that nurse will call her back.   FYI

## 2022-10-15 NOTE — Telephone Encounter (Signed)
Tried to call pt to advised that these are otc but no answer. Last Rx just stated historical provider.

## 2022-10-15 NOTE — Telephone Encounter (Signed)
Called back but pt daughter answered (not daughter on DPR "Maria")wanting update. However, she is not on DPR. Advised to stop by office with pt to update. Daughter advised that she will be at pt home soon.

## 2022-10-16 ENCOUNTER — Telehealth: Payer: Self-pay | Admitting: Adult Health

## 2022-10-16 NOTE — Telephone Encounter (Signed)
Called daughter back but no answer

## 2022-10-16 NOTE — Telephone Encounter (Signed)
Noted  

## 2022-10-16 NOTE — Telephone Encounter (Signed)
Pt came in person with daughter and filled out updated DPR, giving permission to speak to all 3 of her daughters. DPR has been scanned into the system.

## 2022-10-18 NOTE — Telephone Encounter (Signed)
Left message to return phone call.

## 2022-10-21 ENCOUNTER — Encounter: Payer: Self-pay | Admitting: Adult Health

## 2022-10-22 MED ORDER — HIGH POTENCY IRON 65 MG PO TABS: 65.0000 mg | ORAL_TABLET | Freq: Every day | ORAL | 3 refills | Status: DC

## 2022-10-22 MED ORDER — VITAMIN D3 25 MCG (1000 UT) PO CAPS: 1000.0000 [IU] | ORAL_CAPSULE | Freq: Every day | ORAL | 3 refills | Status: AC

## 2022-10-22 MED ORDER — CALCIUM CARBONATE 600 MG PO TABS: 1200.0000 mg | ORAL_TABLET | Freq: Every day | ORAL | 3 refills | Status: AC

## 2022-10-22 NOTE — Telephone Encounter (Signed)
Pt's Daughter, Valli Glance called, returning CMA's call. CMA was with a patient. Pt asked that CMA call back at her earliest convenience.

## 2022-11-03 ENCOUNTER — Encounter: Payer: Self-pay | Admitting: Adult Health

## 2022-11-05 DIAGNOSIS — H5213 Myopia, bilateral: Secondary | ICD-10-CM | POA: Diagnosis not present

## 2022-11-05 DIAGNOSIS — H353111 Nonexudative age-related macular degeneration, right eye, early dry stage: Secondary | ICD-10-CM | POA: Diagnosis not present

## 2022-11-05 DIAGNOSIS — H02834 Dermatochalasis of left upper eyelid: Secondary | ICD-10-CM | POA: Diagnosis not present

## 2022-11-05 DIAGNOSIS — H31001 Unspecified chorioretinal scars, right eye: Secondary | ICD-10-CM | POA: Diagnosis not present

## 2022-11-05 DIAGNOSIS — H02831 Dermatochalasis of right upper eyelid: Secondary | ICD-10-CM | POA: Diagnosis not present

## 2022-11-05 DIAGNOSIS — H2513 Age-related nuclear cataract, bilateral: Secondary | ICD-10-CM | POA: Diagnosis not present

## 2022-11-05 DIAGNOSIS — H52203 Unspecified astigmatism, bilateral: Secondary | ICD-10-CM | POA: Diagnosis not present

## 2022-11-05 NOTE — Telephone Encounter (Signed)
Pt daughter is call back and want you to give her a call back .

## 2022-11-12 DIAGNOSIS — M810 Age-related osteoporosis without current pathological fracture: Secondary | ICD-10-CM | POA: Diagnosis not present

## 2022-11-12 DIAGNOSIS — Z8639 Personal history of other endocrine, nutritional and metabolic disease: Secondary | ICD-10-CM | POA: Diagnosis not present

## 2022-11-19 DIAGNOSIS — M810 Age-related osteoporosis without current pathological fracture: Secondary | ICD-10-CM | POA: Diagnosis not present

## 2022-11-20 ENCOUNTER — Other Ambulatory Visit: Payer: Medicare PPO

## 2022-11-21 DIAGNOSIS — F17200 Nicotine dependence, unspecified, uncomplicated: Secondary | ICD-10-CM | POA: Diagnosis not present

## 2022-11-21 DIAGNOSIS — Z8639 Personal history of other endocrine, nutritional and metabolic disease: Secondary | ICD-10-CM | POA: Diagnosis not present

## 2022-11-21 DIAGNOSIS — M810 Age-related osteoporosis without current pathological fracture: Secondary | ICD-10-CM | POA: Diagnosis not present

## 2022-11-22 ENCOUNTER — Encounter: Payer: Self-pay | Admitting: Physician Assistant

## 2022-11-22 ENCOUNTER — Ambulatory Visit: Payer: Medicare PPO | Admitting: Physician Assistant

## 2022-11-22 VITALS — BP 133/70 | HR 91 | Resp 20 | Ht 63.0 in | Wt 100.0 lb

## 2022-11-22 DIAGNOSIS — G309 Alzheimer's disease, unspecified: Secondary | ICD-10-CM | POA: Diagnosis not present

## 2022-11-22 DIAGNOSIS — F0280 Dementia in other diseases classified elsewhere without behavioral disturbance: Secondary | ICD-10-CM

## 2022-11-22 MED ORDER — DONEPEZIL HCL 10 MG PO TABS: 10.0000 mg | ORAL_TABLET | Freq: Every day | ORAL | 4 refills | Status: DC

## 2022-11-22 MED ORDER — MEMANTINE HCL 10 MG PO TABS: ORAL_TABLET | ORAL | 11 refills | Status: DC

## 2022-11-22 NOTE — Patient Instructions (Addendum)
It was a pleasure to see you today at our office.   Recommendations:  Follow up in  6 months Tue Jan 28 at 11:30  Continue donepezil to 10 mg daily. Side effects were discussed  Continue Memantine 10 mg increase it to 2 times a day Talk to PCP about mood issues  Discontinue smoking   Whom to call:  Memory  decline, memory medications: Call our office [phone]   For psychiatric meds, mood meds: Please have your primary care physician manage these medications.     For assessment of decision of mental capacity and competency:  Call Dr. Erick Blinks, geriatric psychiatrist at [phone]    For guidance regarding [facility]   Sidney Ace, Social Worker tel: [phone]  If you have any severe symptoms of a stroke, or other severe issues such as confusion,severe chills or fever, etc call 911 or go to the ER as you may need to be evaluated further       RECOMMENDATIONS FOR ALL PATIENTS WITH MEMORY PROBLEMS: 1. Continue to exercise (Recommend 30 minutes of walking everyday, or 3 hours every week) 2. Increase social interactions - continue going to [place] and enjoy social gatherings with friends and family 3. Eat healthy, avoid fried foods and eat more fruits and vegetables 4. Maintain adequate blood pressure, blood sugar, and blood cholesterol level. Reducing the risk of stroke and cardiovascular disease also helps promoting better memory. 5. Avoid stressful situations. Live a simple life and avoid aggravations. Organize your time and prepare for the next day in anticipation. 6. Sleep well, avoid any interruptions of sleep and avoid any distractions in the bedroom that may interfere with adequate sleep quality 7. Avoid sugar, avoid sweets as there is a strong link between excessive sugar intake, diabetes, and cognitive impairment We discussed the Mediterranean diet, which has been shown to help patients reduce the risk of progressive memory disorders and  reduces cardiovascular risk. This includes eating fish, eat fruits and green leafy vegetables, nuts like almonds and hazelnuts, walnuts, and also use olive oil. Avoid fast foods and fried foods as much as possible. Avoid sweets and sugar as sugar use has been linked to worsening of memory function.  There is always a concern of gradual progression of memory problems. If this is the case, then we may need to adjust level of care according to patient needs. Support, both to the patient and caregiver, should then be put into place.    The Alzheimer's Association is here all day, every day for people facing Alzheimer's disease through our free 24/7 Helpline: [phone]) 366-6495. The Helpline provides reliable information and support to all those who need assistance, such as individuals living with memory loss, Alzheimer's or other dementia, caregivers, health care professionals and the public.  Our highly trained and knowledgeable staff can help you with: Understanding memory loss, dementia and Alzheimer's  Medications and other treatment options  General information about aging and brain health  Skills to provide quality care and to find the best care from professionals  Legal, financial and living-arrangement decisions Our Helpline also features: Confidential care consultation provided by master's level clinicians who can help with decision-making support, crisis assistance and education on issues families face every day  Help in a caller's preferred language using our translation service that features more than 200 languages and dialects  Referrals to local community programs, services and ongoing support     FALL PRECAUTIONS: Be cautious when walking. Scan the area for obstacles  that may increase the risk of trips and falls. When getting up in the mornings, sit up at the edge of the bed for a few minutes before getting out of bed. Consider elevating the bed at the head end to avoid drop of blood  pressure when getting up. Walk always in a well-lit room (use night lights in the walls). Avoid area rugs or power cords from appliances in the middle of the walkways. Use a walker or a cane if necessary and consider physical therapy for balance exercise. Get your eyesight checked regularly.  FINANCIAL OVERSIGHT: Supervision, especially oversight when making financial decisions or transactions is also recommended.  HOME SAFETY: Consider the safety of the kitchen when operating appliances like stoves, microwave oven, and blender. Consider having supervision and share cooking responsibilities until no longer able to participate in those. Accidents with firearms and other hazards in the house should be identified and addressed as well.   ABILITY TO BE LEFT ALONE: If patient is unable to contact 911 operator, consider using LifeLine, or when the need is there, arrange for someone to stay with patients. Smoking is a fire hazard, consider supervision or cessation. Risk of wandering should be assessed by caregiver and if detected at any point, supervision and safe proof recommendations should be instituted.  MEDICATION SUPERVISION: Inability to self-administer medication needs to be constantly addressed. Implement a mechanism to ensure safe administration of the medications.     Mediterranean Diet A Mediterranean diet refers to food and lifestyle choices that are based on the traditions of countries located on the [employer]. This way of eating has been shown to help prevent certain conditions and improve outcomes for people who have chronic diseases, like kidney disease and heart disease. What are tips for following this plan? Lifestyle  Cook and eat meals together with your family, when possible. Drink enough fluid to keep your urine clear or pale yellow. Be physically active every day. This includes: Aerobic exercise like running or swimming. Leisure activities like gardening, walking, or  housework. Get 7-8 hours of sleep each night. If recommended by your health care provider, drink red wine in moderation. This means 1 glass a day for nonpregnant women and 2 glasses a day for men. A glass of wine equals 5 oz (150 mL). Reading food labels  Check the serving size of packaged foods. For foods such as rice and pasta, the serving size refers to the amount of cooked product, not dry. Check the total fat in packaged foods. Avoid foods that have saturated fat or trans fats. Check the ingredients list for added sugars, such as corn syrup. Shopping  At the grocery store, buy most of your food from the areas near the walls of the store. This includes: Fresh fruits and vegetables (produce). Grains, beans, nuts, and seeds. Some of these may be available in unpackaged forms or large amounts (in bulk). Fresh seafood. Poultry and eggs. Low-fat dairy products. Buy whole ingredients instead of prepackaged foods. Buy fresh fruits and vegetables in-season from local farmers markets. Buy frozen fruits and vegetables in resealable bags. If you do not have access to quality fresh seafood, buy precooked frozen shrimp or canned fish, such as tuna, salmon, or sardines. Buy small amounts of raw or cooked vegetables, salads, or olives from the deli or salad bar at your store. Stock your pantry so you always have certain foods on hand, such as olive oil, canned tuna, canned tomatoes, rice, pasta, and beans. Cooking  [employer]  with extra-virgin olive oil instead of using butter or other vegetable oils. Have meat as a side dish, and have vegetables or grains as your main dish. This means having meat in small portions or adding small amounts of meat to foods like pasta or stew. Use beans or vegetables instead of meat in common dishes like chili or lasagna. Experiment with different cooking methods. Try roasting or broiling vegetables instead of steaming or sauteing them. Add frozen vegetables to soups,  stews, pasta, or rice. Add nuts or seeds for added healthy fat at each meal. You can add these to yogurt, salads, or vegetable dishes. Marinate fish or vegetables using olive oil, lemon juice, garlic, and fresh herbs. Meal planning  Plan to eat 1 vegetarian meal one day each week. Try to work up to 2 vegetarian meals, if possible. Eat seafood 2 or more times a week. Have healthy snacks readily available, such as: Vegetable sticks with hummus. Greek yogurt. Fruit and nut trail mix. Eat balanced meals throughout the week. This includes: Fruit: 2-3 servings a day Vegetables: 4-5 servings a day Low-fat dairy: 2 servings a day Fish, poultry, or lean meat: 1 serving a day Beans and legumes: 2 or more servings a week Nuts and seeds: 1-2 servings a day Whole grains: 6-8 servings a day Extra-virgin olive oil: 3-4 servings a day Limit red meat and sweets to only a few servings a month What are my food choices? Mediterranean diet Recommended Grains: Whole-grain pasta. Brown rice. Bulgar wheat. Polenta. Couscous. Whole-wheat bread. Orpah Cobb. Vegetables: Artichokes. Beets. Broccoli. Cabbage. Carrots. Eggplant. Green beans. Chard. Kale. Spinach. Onions. Leeks. Peas. Squash. Tomatoes. Peppers. Radishes. Fruits: Apples. Apricots. Avocado. Berries. Bananas. Cherries. Dates. Figs. Grapes. Lemons. Melon. Oranges. Peaches. Plums. Pomegranate. Meats and other protein foods: Beans. Almonds. Sunflower seeds. Pine nuts. Peanuts. Cod. Salmon. Scallops. Shrimp. Tuna. Tilapia. Clams. Oysters. Eggs. Dairy: Low-fat milk. Cheese. Greek yogurt. Beverages: Water. Red wine. Herbal tea. Fats and oils: Extra virgin olive oil. Avocado oil. Grape seed oil. Sweets and desserts: [place] yogurt with honey. Baked apples. Poached pears. Trail mix. Seasoning and other foods: Basil. Cilantro. Coriander. Cumin. Mint. Parsley. Sage. Rosemary. Tarragon. Garlic. Oregano. Thyme. Pepper. Balsalmic vinegar. Tahini. Hummus. Tomato  sauce. Olives. Mushrooms. Limit these Grains: Prepackaged pasta or rice dishes. Prepackaged cereal with added sugar. Vegetables: Deep fried potatoes (french fries). Fruits: Fruit canned in syrup. Meats and other protein foods: Beef. Pork. Lamb. Poultry with skin. Hot dogs. Tomasa Blase. Dairy: Ice cream. Sour cream. Whole milk. Beverages: Juice. Sugar-sweetened soft drinks. Beer. Liquor and spirits. Fats and oils: Butter. Canola oil. Vegetable oil. Beef fat (tallow). Lard. Sweets and desserts: Cookies. Cakes. Pies. Candy. Seasoning and other foods: Mayonnaise. Premade sauces and marinades. The items listed may not be a complete list. Talk with your dietitian about what dietary choices are right for you. Summary The Mediterranean diet includes both food and lifestyle choices. Eat a variety of fresh fruits and vegetables, beans, nuts, seeds, and whole grains. Limit the amount of red meat and sweets that you eat. Talk with your health care provider about whether it is safe for you to drink red wine in moderation. This means 1 glass a day for nonpregnant women and 2 glasses a day for men. A glass of wine equals 5 oz (150 mL). This information is not intended to replace advice given to you by your health care provider. Make sure you discuss any questions you have with your health care provider. Document Released: 12/07/2015 Document Revised: 01/09/2016  Document Reviewed: 12/07/2015 Elsevier Interactive Patient Education  2017 [employer].

## 2022-11-22 NOTE — Progress Notes (Signed)
Assessment/Plan:     Emma Schneider is a very pleasant 72 y.o. 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 Patient is currently on donepezil 10 mg daily and memantine 10 mg daily.  Unfortunately, cognitive decline is noted, and today's MMSE is 15/30.  Given the MMSE score, there is no indication for repeating neuropsych evaluation. It is not clear if the recent death of her husband has exacerbated her symptoms or if these are irreversible.  Follow up in  6 months. Continue donepezil 10 mg daily and increase memantine to 10 mg twice a day  Recommend good control of her cardiovascular risk factors Continue to control mood as per PCP Discontinue smoking, continue nicotine gum.  Recommend Adult Day Program, Counseling for grieving      Subjective:    This patient is accompanied in the office by her daughters who supplements the history.  Previous records as well as any outside records available were reviewed prior to todays visit. Patient was last seen on 05/23/2022 with an MMSE 21/30    Any changes in memory since last visit? "  Not very good".  She has difficulty remembering recent conversations and names of people that she just meets.   Tries to avoid conversations because she is aware of it.  She does not participate in social activities or has much social stimulation.  She does not do any brain games.  Her husband has recently died, which brought the component of situational depression, and she is learning to adapt to her new environment without him. Repeats oneself?  Endorsed Disoriented when walking into a room?  Patient denies    Leaving objects in unusual places?  Endorsed, occasionally  wandering behavior?  denies   Any personality changes since last visit?  More irritability since her husband died.  Any worsening  depression?: Situational depression Hallucinations or paranoia?  denies   Seizures? denies    Any sleep changes?   Sleeps well. Denies vivid dreams, REM behavior or sleepwalking   Sleep apnea?   denies   Any hygiene concerns? Needs to be reminded  Independent of bathing and dressing?  Endorsed  Does the patient needs help with medications?  Patient is in charge, takes pillpacks and recently hired a  caregiver makes sure she takes them.  Who is in charge of the finances? Daughter is in charge  Any changes in appetite?  denies     Patient have trouble swallowing? denies   Does the patient cook? "Not as much"  Any headaches?   denies   Chronic back pain  denies   Ambulates with difficulty? denies    Recent falls or head injuries? denies     Unilateral weakness, numbness or tingling? denies   Any tremors?  denies   Any anosmia?  Patient denies   Any incontinence of urine?  Endorsed  Any bowel dysfunction?     denies      Patient lives alone, with caregiver at home every morning and evening    Does the patient drive? No longer drives   Prior MRI brain January 2023 personally reviewed was remarkable for generalized atrophy, and mild to moderate microvascular ischemic changes.    Initial evaluation on 11/15/2021   How long did patient have memory difficulties?  Her family report that the memory issues have been present for  about 5 years worse since about Oct 2022. She also has greater difficulty with reading comprehension.  She has more difficulty with crossword puzzles and word finding.  Long-term memory is normal. There are difficulties with multitasking, organization and impulsivity. Patient lives with: Spouse  repeats oneself? Endorsed "occasionally", although her husband says that this is more frequent than prior. Disoriented when walking into a room?  "It can be a scary situation if she is in a new environment".   Leaving objects in unusual places?  Patient denies   Ambulates  with  difficulty?  "Getting a little more wobbly than before ". She has to think ahead if she is in uneven ground.   Recent falls?  Patient denies   Any head injuries?  Patient denies   History of seizures?   Patient denies   Wandering behavior?  Patient denies   Patient drives?  Her husband does not allow her to drive alone, and she only drives very short distances, preventing major intersections. Any mood changes?  Family report more impulsivity, she becomes irritable more quickly, especially when confronted.  She was taking Wellbutrin which was discontinued due to hallucinations. Any history of depression?:  Denies Hallucinations? Around 03/2021 she began having visual hallucinations, such as seeing dogs and cats under her porch, and animals roaming through the house, but after discontinuing Wellbutrin these hallucinations disappeared.  Paranoia? About 1 year ago it was reported the patient began accusing her husband of having affairs.  This has subsided. Patient reports that she sleeps well without vivid dreams, REM behavior or sleepwalking    History of sleep apnea?  Patient denies   Any hygiene concerns?  Patient denies   Independent of bathing and dressing?  Endorsed  Does the patient needs help with medications?  Husband is in charge Who is in charge of the finances?  Husband is in charge    Any changes in appetite?  Patient denies   Patient have trouble swallowing? Patient denies   Does the patient cook?  Patient denies .  "Has forgotten common recipes for the last year, has become more confused between the tsp and Tsp " Any kitchen accidents such as leaving the stove on? Patient denies   Any headaches?  Only when stressed. Double vision? Patient denies   Any focal numbness or tingling?  Patient denies   Chronic back pain Patient denies   Unilateral weakness?  Patient denies   Any tremors?  Patient denies   Any history of anosmia?  Patient denies   Any incontinence of urine?  Patient  denies   Any bowel dysfunction?   Patient denies      History of heavy alcohol intake?  Patient denies   History of heavy tobacco use?  Endorsed Family history of dementia?  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 She continues to smoke   Neuropsych evaluation 10/2021 Dr. Milbert Coulter  Briefly, results suggested primary impairments surrounding semantic fluency, confrontation naming, and essentially all aspects of learning and memory. Additional weaknesses were exhibited across complex attention, receptive language, and visuospatial abilities, while variability was exhibited across processing speed, basic attention, and executive functioning. Regarding etiology, I unfortunately have concerns surrounding underlying Alzheimer's disease. Ms. Bester did not benefit from repeated exposure to information across learning trials, performed poorly across delayed recall trials, and performed poorly across yes/no recognition trials. Taken together, this suggests evidence for rapid forgetting and an evolving and already quite significant memory storage impairment, both of which are hallmark characteristics of  this illness. Additional impairments across confrontation naming and semantic fluency align very well with this illness as they are the expected domains to exhibit impairment following early memory dysfunction. Visuospatial weakness also aligns well with typical disease progression. While I am certainly not able to ascertain the validity of her allegations surrounding her husband's behaviors, both her husband and their daughter denied these actions and the emergence of suspicion and delusional thinking is certainly seen in individuals with Alzheimer's disease.     PREVIOUS MEDICATIONS:   CURRENT MEDICATIONS:  Outpatient Encounter Medications as of 11/22/2022  Medication Sig   atorvastatin (LIPITOR) 10 MG tablet Take 1 tablet (10 mg total) by mouth daily.   calcium carbonate (OS-CAL) 600 MG TABS tablet Take  2 tablets (1,200 mg total) by mouth daily.   Cholecalciferol (VITAMIN D3) 25 MCG (1000 UT) CAPS Take 1 capsule (1,000 Units total) by mouth daily.   Ferrous Sulfate Dried (HIGH POTENCY IRON) 65 MG TABS Take 65 mg by mouth daily.   levothyroxine (SYNTHROID) 25 MCG tablet Take 1 tablet (25 mcg total) by mouth daily before breakfast.   mirtazapine (REMERON) 15 MG tablet Take 1 tablet (15 mg total) by mouth at bedtime.   Zoledronic Acid (ZOMETA) 4 MG/100ML IVPB 100 ml over 15 minutes   [DISCONTINUED] donepezil (ARICEPT) 10 MG tablet Take 1 tablet (10 mg total) by mouth at bedtime.   [DISCONTINUED] memantine (NAMENDA) 10 MG tablet Take half tablet (5 mg) daily for 2 weeks, then increase one full tablet at 10 mg nightly   donepezil (ARICEPT) 10 MG tablet Take 1 tablet (10 mg total) by mouth at bedtime.   memantine (NAMENDA) 10 MG tablet Take one tab two times a day   No facility-administered encounter medications on file as of 11/22/2022.       11/22/2022   12:00 PM 05/23/2022    1:00 PM  MMSE - Mini Mental State Exam  Orientation to time 1 4  Orientation to Place 5 5  Registration 2 2  Attention/ Calculation 0 0  Recall 0 2  Language- name 2 objects 2 2  Language- repeat 0 1  Language- follow 3 step command 3 3  Language- read & follow direction 1 1  Write a sentence 1 1  Copy design 0 0  Total score 15 21       No data to display          Objective:     PHYSICAL EXAMINATION:    VITALS:   Vitals:   11/22/22 1128  BP: 133/70  Pulse: 91  Resp: 20  SpO2: 100%  Weight: 100 lb (45.4 kg)  Height: 5\' 3"  (1.6 m)    GEN:  The patient appears stated age and is in NAD. HEENT:  Normocephalic, atraumatic.   Neurological examination:  General: NAD, well-groomed, appears stated age. Orientation: The patient is alert. Oriented to person, place and  not to date Cranial nerves: There is good facial symmetry. Flat affect. The speech is fluent and clear. No aphasia or dysarthria.  Fund of knowledge is appropriate. Recent and remote memory are impaired. Attention and concentration are reduced.  Able to name objects and repeat phrases.  Hearing is intact to conversational tone.   Sensation: Sensation is intact to light touch throughout Motor: Strength is at least antigravity x4. DTR's 2/4 in UE/LE     Movement examination: Tone: There is normal tone in the UE/LE Abnormal movements:  no tremor.  No myoclonus.  No asterixis.  Coordination:  There is no decremation with RAM's. Normal finger to nose  Gait and Station: The patient has no difficulty arising out of a deep-seated chair without the use of the hands. The patient's stride length is good.  Gait is cautious and narrow.    Thank you for allowing [place] the opportunity to participate in the care of this nice patient. Please do not hesitate to contact us for any questions or concerns.   Total time spent on today's visit was 45 minutes dedicated to this patient today, preparing to see patient, examining the patient, ordering tests and/or medications and counseling the patient, documenting clinical information in the EHR or other health record, independently interpreting results and communicating results to the patient/family, discussing treatment and goals, answering patient's questions and coordinating care.  Cc:  Shirline Frees, NP  Marlowe Kays 11/22/2022 12:31 PM

## 2022-11-27 DIAGNOSIS — H903 Sensorineural hearing loss, bilateral: Secondary | ICD-10-CM | POA: Diagnosis not present

## 2022-11-27 DIAGNOSIS — H9312 Tinnitus, left ear: Secondary | ICD-10-CM | POA: Diagnosis not present

## 2022-11-27 DIAGNOSIS — F0390 Unspecified dementia without behavioral disturbance: Secondary | ICD-10-CM | POA: Diagnosis not present

## 2022-12-04 ENCOUNTER — Encounter: Payer: Self-pay | Admitting: Adult Health

## 2022-12-04 ENCOUNTER — Ambulatory Visit: Payer: Medicare PPO | Admitting: Adult Health

## 2022-12-04 VITALS — BP 138/60 | HR 70 | Temp 98.1°F | Ht 63.0 in | Wt 100.0 lb

## 2022-12-04 DIAGNOSIS — R451 Restlessness and agitation: Secondary | ICD-10-CM | POA: Diagnosis not present

## 2022-12-04 DIAGNOSIS — F320 Major depressive disorder, single episode, mild: Secondary | ICD-10-CM

## 2022-12-04 DIAGNOSIS — F17200 Nicotine dependence, unspecified, uncomplicated: Secondary | ICD-10-CM | POA: Diagnosis not present

## 2022-12-04 MED ORDER — MIRTAZAPINE 30 MG PO TABS
30.0000 mg | ORAL_TABLET | Freq: Every day | ORAL | 1 refills | Status: AC
Start: 2022-12-04 — End: ?

## 2022-12-04 NOTE — Addendum Note (Signed)
Addended by: Nancy Fetter on: 12/04/2022 02:58 PM   Modules accepted: Level of Service

## 2022-12-04 NOTE — Patient Instructions (Signed)
I am going to increase your dose of Remeron to 30 mg nightly   Lets follow up in about a month to see how you are doing

## 2022-12-04 NOTE — Progress Notes (Addendum)
Subjective:    Patient ID: Emma Schneider, female    DOB: 11/03/1950, 72 y.o.   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  She presents to the office today with he two daughters.   She has a history of Alzheimers Dementia and is managed by Neurology. She is currently on Aricept 10 mg and Nemantine 10 mg BID. Marland Kitchen She was recently seen by Neurology about 2 weeks ago and it was noted at that time that there had been some cognitive decline; her MMSE was 15/30.   Her husband recently passed away and it was not clear If this trauma exacerbated her symptoms. She was advised to follow up with PCP about help with controlling her mood. She is on Remeron 15 mg for insomnia and anxiety.   Today her daughters report that for about 6 weeks after her father's death they took turns staying with their mother.  Currently they have home health coming in throughout the week to keep her company, help with medication management, take her to the grocery store etc.  I have noticed that she has waxing and waning depression as well as agitation after her husband's death.  She does report that she is sleeping well.  Her appetite could use some improvement.  Family also reports that she is working on quitting smoking, she is using nicotine gum and has decreased from 1 pack a day to roughly half a pack.    Review of Systems See HPI   Past Medical History:  Diagnosis Date   Adenoma of right adrenal gland 12/16/2014    Adrenal incidentaloma (HCC)    AML (acute myeloid leukemia) in remission 09/28/1998   completed tx 01/1999 - remission since, [facility]. Dr. 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 Will repeat dexa this year and Rx risidronate in the mean time. Asked pt to find out from PCP if they had prescribed Actonel prior so we can determine how many year   Personal history of colonic polyps 09/27/2016   3 polyps removed, repeated 02/2020, due 3 yrs   Thyroid dysfunction 05/11/2019   dx 02/2019, started on Euthyrox   Tobacco use 01/06/2015   Vitamin D deficiency     Social History   Socioeconomic History   Marital status: Married    Spouse name: Not on file   Number of children: Not on file   Years of education: 13   Highest education level: Some college, no degree  Occupational History   Occupation: Retired    Comment: Sales executive  Tobacco Use   Smoking status: Every Day    Current packs/day: 1.00    Average  packs/day: 1 pack/day for 42.0 years (42.0 ttl pk-yrs)    Types: Cigarettes   Smokeless tobacco: Never   Tobacco comments:    Is actively trying to cut down number of cigarettes, but not ready to set a quit date. 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    -Married for 30 years     - Has step children, all live in [place]   - No pets   - Plays games on the computer and enjoys yard work.    Two story home   Caffeine a lot of pepsi   Social Determinants of Health   Financial Resource Strain: Low Risk  (05/07/2022)   Overall Financial Resource Strain (CARDIA)    Difficulty of Paying Living Expenses: Not hard at all  Food Insecurity: No Food Insecurity (05/07/2022)   Hunger Vital Sign    Worried About Running Out of Food in the Last Year: Never true    Ran Out of Food in the Last Year: Never true  Transportation Needs: No Transportation Needs (05/07/2022)   PRAPARE - Administrator, Civil Service (Medical): No    Lack of Transportation (Non-Medical): No  Physical Activity: Inactive (05/07/2022)   Exercise Vital Sign    Days of Exercise per Week: 0 days    Minutes of Exercise per Session: 0 min  Stress: No Stress Concern Present (05/07/2022)   [employer] of Occupational Health - Occupational Stress Questionnaire    Feeling of Stress : Not at all  Social Connections: Socially Integrated (05/07/2022)   Social Connection and Isolation Panel [NHANES]    Frequency of Communication with Friends and Family: More than three times a week    Frequency of Social Gatherings with Friends and Family: More than three times a week    Attends Religious Services: More than 4 times per year    Active Member of [employer] or Organizations: Yes    Attends Banker Meetings: More than 4 times per year    Marital Status: Married  Catering manager Violence: Not At Risk (05/07/2022)   Humiliation, Afraid, Rape, and Kick questionnaire    Fear of Current or Ex-Partner: No    Emotionally Abused: No    Physically Abused: No    Sexually Abused: No    History reviewed. No pertinent surgical history.  Family History  Problem Relation Age of Onset   Congestive Heart Failure Mother    Dementia Mother        late in life   Heart attack Father    Heart disease Father      Allergies  Allergen Reactions   Wellbutrin [Bupropion] Other (See Comments)    Hallucinations     Current Outpatient Medications on File Prior to Visit  Medication Sig Dispense Refill   atorvastatin (LIPITOR) 10 MG tablet Take 1 tablet (10 mg total) by mouth daily. 90 tablet 3   calcium carbonate (OS-CAL) 600 MG TABS tablet Take 2 tablets (1,200 mg total) by mouth daily. 90 tablet 3   Cholecalciferol (VITAMIN D3) 25 MCG (1000 UT) CAPS Take 1 capsule (1,000 Units total) by mouth daily. 90 capsule 3  donepezil (ARICEPT) 10 MG tablet Take 1 tablet (10 mg total) by mouth at bedtime. 90 tablet 4   Ferrous Sulfate Dried (HIGH POTENCY IRON) 65 MG TABS Take 65 mg by mouth daily. 90 tablet 3   levothyroxine (SYNTHROID) 25 MCG tablet Take 1 tablet (25 mcg total) by mouth daily before breakfast. 90 tablet 3   memantine (NAMENDA) 10 MG tablet Take one tab two times a day 60 tablet 11   mirtazapine (REMERON) 15 MG tablet Take 1 tablet (15 mg total) by mouth at bedtime. 90 tablet 3   Zoledronic Acid (ZOMETA) 4 MG/100ML IVPB 100 ml over 15 minutes     No current facility-administered medications on file prior to visit.    BP 138/60   Pulse 70   Temp 98.1 F (36.7 C) (Oral)   Ht 5\' 3"  (1.6 m)   Wt 100 lb (45.4 kg)   SpO2 95%   BMI 17.71 kg/m       Objective:   Physical Exam Vitals and nursing note reviewed.  Constitutional:      Appearance: Normal appearance.  Cardiovascular:     Rate and Rhythm: Normal rate and regular rhythm.     Pulses: Normal pulses.     Heart sounds: Normal heart sounds.  Pulmonary:     Effort: Pulmonary effort is normal.     Breath sounds: Normal breath sounds.  Musculoskeletal:        General: Normal range of motion.  Skin:    General: Skin is warm and dry.  Neurological:     General: No focal deficit present.     Mental Status: She is alert and oriented to person, place, and time.  Psychiatric:        Mood and Affect: Mood normal.        Behavior:  Behavior normal.        Thought Content: Thought content normal.        Judgment: Judgment normal.       Assessment & Plan:  1. Depression, major, single episode, mild (HCC) -Will increase her Remeron from 15 mg to 30 mg to see if this gives her any benefit. - mirtazapine (REMERON) 30 MG tablet; Take 1 tablet (30 mg total) by mouth at bedtime.  Dispense: 90 tablet; Refill: 1 - Follow up  in 1 month  2. Agitation -Possibly due to cognitive issues as well as quitting smoking.  Will see if her increase in Remeron helps with this. - mirtazapine (REMERON) 30 MG tablet; Take 1 tablet (30 mg total) by mouth at bedtime.  Dispense: 90 tablet; Refill: 1  3. Tobacco use - Congratulated on taking steps to quit smoking   Shirline Frees, NP  Time spent with patient today was 32 minutes which consisted of chart review, discussing depression, agitation, dementia, and tobacco use work up, treatment answering questions and documentation.

## 2022-12-06 ENCOUNTER — Ambulatory Visit: Admission: RE | Admit: 2022-12-06 | Payer: Medicare PPO | Source: Ambulatory Visit

## 2022-12-06 DIAGNOSIS — F17210 Nicotine dependence, cigarettes, uncomplicated: Secondary | ICD-10-CM

## 2022-12-06 DIAGNOSIS — Z122 Encounter for screening for malignant neoplasm of respiratory organs: Secondary | ICD-10-CM

## 2022-12-06 DIAGNOSIS — Z87891 Personal history of nicotine dependence: Secondary | ICD-10-CM

## 2022-12-11 ENCOUNTER — Other Ambulatory Visit: Payer: Self-pay

## 2022-12-11 DIAGNOSIS — Z122 Encounter for screening for malignant neoplasm of respiratory organs: Secondary | ICD-10-CM

## 2022-12-11 DIAGNOSIS — F17210 Nicotine dependence, cigarettes, uncomplicated: Secondary | ICD-10-CM

## 2022-12-11 DIAGNOSIS — Z87891 Personal history of nicotine dependence: Secondary | ICD-10-CM

## 2022-12-12 ENCOUNTER — Encounter (INDEPENDENT_AMBULATORY_CARE_PROVIDER_SITE_OTHER): Payer: Self-pay

## 2022-12-31 ENCOUNTER — Ambulatory Visit: Payer: Medicare PPO | Admitting: Adult Health

## 2022-12-31 VITALS — BP 120/60 | HR 76 | Temp 97.9°F | Ht 63.0 in | Wt 99.4 lb

## 2022-12-31 DIAGNOSIS — R451 Restlessness and agitation: Secondary | ICD-10-CM | POA: Diagnosis not present

## 2022-12-31 DIAGNOSIS — F320 Major depressive disorder, single episode, mild: Secondary | ICD-10-CM | POA: Diagnosis not present

## 2022-12-31 NOTE — Progress Notes (Signed)
Subjective:    Patient ID: Emma Schneider, female    DOB: 08/24/1950, 72 y.o.   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  She presents to the office today for follow up regarding depression, anxiety and agitation.   She is with her daughters today.   She was seen roughly a month ago for worsening anxiety/depression/agitation after her husband's death.  He was already on Remeron 15 mg nightly along with Aricept 10 mg and Namenda teen 10 mg twice daily.  We decided to increase her Remeron to 30 mg nightly.  Her daughters report that over the last 2 weeks they have noticed improvement in her symptoms.  Her anxiety and agitation seem to be better controlled and she can brush off things a little bit easier than she could prior to the increased dose.  Her depression also seems to be better.  She is eating more throughout the day and her appetite has increased.    Review of Systems See HPI   Past Medical History:  Diagnosis Date   Adenoma of right adrenal gland 12/16/2014   Adrenal incidentaloma (HCC)    AML (acute myeloid leukemia) in remission 09/28/1998   completed tx 01/1999 - remission since, sees [facility]onc at [facility]. Dr. Christell Constant   Aortic atherosclerosis    Chicken pox    Dementia likely due to Alzheimer's disease 11/06/2021   History of antineoplastic chemotherapy 07/24/2015   Hyperlipidemia    Microscopic hematuria 10/17/2014   Osteopenia    now OSTEOPOROSIS, dexa 2017- R Femur  -2.2 was on Actonel prior so FRAX not appropriate but it was reported at major 10.9% and hip 3.1%- dexa ordered 2019, never had done, but HPI reported pt had dexa 2019 at last annual so not ordered. Will repeat dexa this year and Rx risidronate in the mean time. Asked pt to find out from PCP if they had prescribed Actonel prior so we can determine how many year   Personal history of colonic polyps 09/27/2016   3 polyps removed, repeated 02/2020, due 3 yrs   Thyroid dysfunction 05/11/2019   dx 02/2019, started on Euthyrox   Tobacco use 01/06/2015   Vitamin D deficiency     Social History   Socioeconomic History   Marital status: Married    Spouse name: Not on file   Number of children: Not on file   Years of education: 13   Highest education level: Some college, no degree  Occupational History   Occupation: Retired    Comment: Sales executive  Tobacco Use   Smoking status: Every Day    Current packs/day: 1.00    Average packs/day: 1 pack/day for 42.0 years (42.0 ttl pk-yrs)    Types: Cigarettes   Smokeless tobacco: Never   Tobacco comments:    Is actively trying to cut down number of cigarettes, but not ready to set a quit date. 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    -Married for 30 years    - Has step children, all live in [place]   - No pets   - Plays games on the computer and enjoys yard work.    Two story home   Caffeine a lot of pepsi   Social Determinants of Health   Financial Resource Strain: Low Risk  (05/07/2022)   Overall Financial Resource Strain (CARDIA)    Difficulty of Paying Living Expenses: Not hard at all  Food Insecurity: No Food Insecurity (05/07/2022)   Hunger Vital Sign    Worried About  Running Out of Food in the Last Year: Never true    Ran Out of Food in the Last Year: Never true  Transportation Needs: No Transportation Needs (05/07/2022)   PRAPARE - Administrator, Civil Service (Medical): No    Lack of Transportation (Non-Medical): No  Physical Activity: Inactive (05/07/2022)   Exercise Vital Sign    Days of Exercise per Week: 0 days    Minutes of Exercise per Session: 0 min  Stress: No Stress Concern Present (05/07/2022)   [employer] of Occupational Health - Occupational Stress Questionnaire    Feeling of Stress : Not at all  Social Connections: Socially Integrated (05/07/2022)   Social Connection and Isolation Panel [NHANES]    Frequency of Communication with Friends and Family: More than three times a week    Frequency of Social Gatherings with Friends and Family: More than three times a week    Attends Religious Services: More than 4 times per year    Active Member of [employer] or Organizations: Yes    Attends Banker Meetings: More than 4 times per year    Marital Status: Married  Catering manager Violence: Not At Risk (05/07/2022)   Humiliation, Afraid, Rape, and Kick questionnaire    Fear of Current or Ex-Partner: No    Emotionally Abused: No    Physically Abused: No    Sexually Abused: No    No past surgical history on file.  Family History  Problem Relation Age of Onset   Congestive Heart Failure Mother    Dementia Mother        late in life   Heart attack Father    Heart disease Father     Allergies  Allergen Reactions   Wellbutrin [Bupropion] Other (See Comments)    Hallucinations     Current Outpatient Medications on File Prior to Visit  Medication Sig Dispense Refill   atorvastatin (LIPITOR) 10 MG tablet Take 1 tablet (10 mg total) by mouth daily. 90 tablet 3   calcium carbonate (OS-CAL) 600 MG TABS tablet Take 2 tablets (1,200 mg total) by mouth daily. 90 tablet 3   Cholecalciferol (VITAMIN D3) 25 MCG (1000 UT)  CAPS Take 1 capsule (1,000 Units total) by mouth daily. 90 capsule 3   donepezil (ARICEPT) 10 MG tablet Take 1 tablet (10 mg total) by mouth at bedtime. 90 tablet 4   Ferrous Sulfate Dried (HIGH POTENCY IRON) 65 MG TABS Take 65 mg by mouth daily. 90 tablet 3   levothyroxine (SYNTHROID) 25 MCG tablet Take 1 tablet (25 mcg total) by mouth daily before breakfast. 90 tablet 3   memantine (NAMENDA) 10 MG tablet Take one tab two times a day 60 tablet 11   mirtazapine (REMERON) 30 MG  tablet Take 1 tablet (30 mg total) by mouth at bedtime. 90 tablet 1   nicotine polacrilex (NICORETTE) 2 MG gum SMARTSIG:1 Each By Mouth Every 8 Hours PRN     Zoledronic Acid (ZOMETA) 4 MG/100ML IVPB 100 ml over 15 minutes     No current facility-administered medications on file prior to visit.    BP 120/60   Pulse 76   Temp 97.9 F (36.6 C) (Oral)   Ht 5\' 3"  (1.6 m)   Wt 99 lb 6.4 oz (45.1 kg)   SpO2 96%   BMI 17.61 kg/m       Objective:   Physical Exam Vitals and nursing note reviewed.  Constitutional:      Appearance: Normal appearance.  Cardiovascular:     Rate and Rhythm: Normal rate and regular rhythm.     Pulses: Normal pulses.     Heart sounds: Normal heart sounds.  Neurological:     General: No focal deficit present.     Mental Status: She is alert and oriented to person, place, and time.  Psychiatric:        Attention and Perception: Attention and perception normal.        Mood and Affect: Mood and affect normal.        Speech: Speech normal.        Behavior: Behavior normal.        Thought Content: Thought content normal.        Cognition and Memory: Cognition is impaired. Memory is impaired. She exhibits impaired recent memory and impaired remote memory.        Assessment & Plan:  1. Depression, major, single episode, mild (HCC) -Has been improvement and family is happy with the results.  Will keep her on Remeron 30 mg nightly.  She will get more benefit over the next couple weeks  with this dose.  Advised family to follow-up if they notice any abnormal side effects or worsening of symptoms  2. Agitation  Shirline Frees, NP  Time spent with patient today was 34 minutes which consisted of chart review, discussing  agitation, depression, and dementia  treatment answering questions and documentation.

## 2022-12-31 NOTE — Patient Instructions (Signed)
It was great seeing you today   I am glad things are going better. I am going to keep you on the current dose of Remeron. Let me know if anything changes

## 2023-01-28 ENCOUNTER — Encounter: Payer: Self-pay | Admitting: Adult Health

## 2023-01-28 ENCOUNTER — Ambulatory Visit: Payer: Medicare PPO | Admitting: Adult Health

## 2023-01-28 VITALS — BP 120/60 | HR 76 | Temp 98.0°F | Ht 63.0 in | Wt 100.0 lb

## 2023-01-28 DIAGNOSIS — G309 Alzheimer's disease, unspecified: Secondary | ICD-10-CM

## 2023-01-28 DIAGNOSIS — F0280 Dementia in other diseases classified elsewhere without behavioral disturbance: Secondary | ICD-10-CM

## 2023-01-28 DIAGNOSIS — Z23 Encounter for immunization: Secondary | ICD-10-CM

## 2023-01-28 NOTE — Patient Instructions (Addendum)
It was great seeing you today   I recommend the following memory care facilities   [place] [facility]  [address] [place]   It is a good idea to take a tour of these

## 2023-01-28 NOTE — Progress Notes (Signed)
Subjective:    Patient ID: Emma Schneider, female    DOB: 1950/12/29, 72 y.o.   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  She presents to the office today with her daughters for a fall.  He does not remember falling.  Her family reports that her home health agency called EMS after the fall and that she was already back in bed when EMS arrived and woke her up.  The only signs of injury are a small superficial abrasion on her upper lip on the right side and on the lip itself.  Her daughters report good days and bad days with confusion, she does seem to have been more confused as of lately.  Denies UTI-like symptoms but will check this today.  Daughters have also been looking for a memory care facility and have FL 2 forms for me to fill out   Review of Systems See HPI   Past Medical History:  Diagnosis Date   Adenoma of right adrenal gland 12/16/2014   Adrenal incidentaloma (HCC)    AML (acute myeloid leukemia) in remission 09/28/1998   completed tx 01/1999 - remission since, sees [facility]on[facility] at [facility]. Dr. Christell Constant   Aortic atherosclerosis    Chicken pox    Dementia likely due to Alzheimer's disease 11/06/2021   History of antineoplastic chemotherapy 07/24/2015   Hyperlipidemia    Microscopic hematuria 10/17/2014   Osteopenia    now OSTEOPOROSIS, dexa 2017- R Femur -2.2 was on Actonel prior so FRAX not appropriate but it was reported at major 10.9% and hip 3.1%- dexa ordered 2019,  never had done, but HPI reported pt had dexa 2019 at last annual so not ordered. Will repeat dexa this year and Rx risidronate in the mean time. Asked pt to find out from PCP if they had prescribed Actonel prior so we can determine how many year   Personal history of colonic polyps 09/27/2016   3 polyps removed, repeated 02/2020, due 3 yrs   Thyroid dysfunction 05/11/2019   dx 02/2019, started on Euthyrox   Tobacco use 01/06/2015   Vitamin D deficiency     Social History   Socioeconomic History   Marital status: Married    Spouse name: Not on file   Number of children: Not on file   Years of education: 13   Highest education level: Some college, no degree  Occupational History   Occupation: Retired    Comment: Sales executive  Tobacco Use   Smoking status: Every Day    Current packs/day: 1.00    Average packs/day: 1 pack/day for 42.0 years (42.0 ttl pk-yrs)    Types: Cigarettes   Smokeless tobacco: Never   Tobacco comments:    Is actively trying to cut down number of cigarettes, but not ready to set a quit date. 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    -Married for 30 years    - Has step children, all live in [place]   - No pets   - Plays games on the computer and enjoys yard work.    Two story home   Caffeine a lot of pepsi   Social Determinants of Health   Financial Resource Strain: Low Risk  (05/07/2022)   Overall Financial Resource Strain (CARDIA)    Difficulty of Paying Living Expenses: Not hard at all  Food Insecurity: No Food Insecurity (05/07/2022)   Hunger Vital Sign    Worried About Running Out of Food in the Last Year: Never true    Ran Out of Food in the Last Year: Never true  Transportation  Needs: No Transportation Needs (05/07/2022)   PRAPARE - Administrator, Civil Service (Medical): No    Lack of Transportation (Non-Medical): No  Physical Activity: Inactive (05/07/2022)   Exercise Vital Sign    Days of Exercise per Week: 0 days    Minutes of Exercise per Session: 0 min  Stress: No Stress Concern Present (05/07/2022)   [employer] of Occupational Health - Occupational Stress Questionnaire    Feeling of Stress : Not at all  Social Connections: Socially Integrated (05/07/2022)   Social Connection and Isolation Panel [NHANES]    Frequency of Communication with Friends and Family: More than three times a week    Frequency of Social Gatherings with Friends and Family: More than three times a week    Attends Religious Services: More than 4 times per year    Active Member of [employer] or Organizations: Yes    Attends Banker Meetings: More than 4 times per year    Marital Status: Married  Catering manager Violence: Not At Risk (05/07/2022)   Humiliation, Afraid, Rape, and Kick questionnaire    Fear of Current or Ex-Partner: No    Emotionally Abused: No    Physically Abused: No    Sexually Abused: No    No past surgical history on file.  Family History  Problem Relation Age of Onset   Congestive Heart Failure Mother    Dementia Mother        late in life   Heart attack Father    Heart disease Father     Allergies  Allergen Reactions   Wellbutrin [Bupropion] Other (See Comments)    Hallucinations     Current Outpatient Medications on File Prior to Visit  Medication Sig Dispense Refill   atorvastatin (LIPITOR) 10 MG tablet Take 1 tablet (10 mg total) by mouth daily. 90 tablet 3   calcium carbonate (OS-CAL) 600 MG TABS tablet Take 2 tablets (1,200 mg total) by mouth daily. 90 tablet 3   Cholecalciferol (VITAMIN D3) 25 MCG (1000 UT) CAPS Take 1 capsule (1,000 Units total) by mouth daily. 90 capsule 3   donepezil (ARICEPT) 10 MG tablet Take 1 tablet  (10 mg total) by mouth at bedtime. 90 tablet 4   FEROSUL 325 (65 Fe) MG tablet Take 325 mg by mouth daily.     Ferrous Sulfate Dried (HIGH POTENCY IRON) 65 MG TABS Take 65 mg by mouth daily. 90 tablet 3   levothyroxine (SYNTHROID) 25 MCG tablet Take 1 tablet (25 mcg total) by mouth daily before breakfast. 90 tablet 3   memantine (NAMENDA) 10 MG tablet Take one tab two times a day 60 tablet 11   mirtazapine (  REMERON) 30 MG tablet Take 1 tablet (30 mg total) by mouth at bedtime. 90 tablet 1   nicotine polacrilex (NICORETTE) 2 MG gum SMARTSIG:1 Each By Mouth Every 8 Hours PRN     Zoledronic Acid (ZOMETA) 4 MG/100ML IVPB 100 ml over 15 minutes     No current facility-administered medications on file prior to visit.    BP 120/60   Pulse 76   Temp 98 F (36.7 C) (Oral)   Ht 5\' 3"  (1.6 m)   Wt 100 lb (45.4 kg)   SpO2 96%   BMI 17.71 kg/m       Objective:   Physical Exam Vitals and nursing note reviewed.  Constitutional:      Appearance: Normal appearance.  Cardiovascular:     Rate and Rhythm: Normal rate and regular rhythm.     Pulses: Normal pulses.     Heart sounds: Normal heart sounds.  Pulmonary:     Effort: Pulmonary effort is normal.     Breath sounds: Normal breath sounds.  Abdominal:     General: Abdomen is flat. Bowel sounds are normal. There is no distension.     Palpations: Abdomen is soft.     Tenderness: There is no abdominal tenderness. There is no right CVA tenderness or left CVA tenderness.  Neurological:     Mental Status: She is alert. Mental status is at baseline.  Psychiatric:        Speech: Speech normal.        Thought Content: Thought content normal.        Cognition and Memory: Cognition is impaired. Memory is impaired. She exhibits impaired recent memory and impaired remote memory.        Assessment & Plan:  1. Dementia likely due to Alzheimer's disease - Continue with Namenda 10 mg BID and Aricept 10 mg daily.  - Will fill out FL2 - Urine  Culture; Future - Urinalysis; Future - Urinalysis - Urine Culture  2. Need for influenza vaccination  - Flu Vaccine Trivalent High Dose (Fluad)  Shirline Frees, NP  Time spent with patient today was 42 minutes which consisted of chart review, discussing confusion, discussion about fall and Memory Care facilities in the area, work up, treatment answering questions and documentation.

## 2023-01-29 ENCOUNTER — Telehealth: Payer: Self-pay

## 2023-01-29 NOTE — Telephone Encounter (Signed)
Lm for pt daughter to advised that PPW was ready for pick up.

## 2023-01-30 ENCOUNTER — Other Ambulatory Visit: Payer: Medicare PPO

## 2023-01-30 DIAGNOSIS — G309 Alzheimer's disease, unspecified: Secondary | ICD-10-CM | POA: Diagnosis not present

## 2023-01-30 DIAGNOSIS — F0280 Dementia in other diseases classified elsewhere without behavioral disturbance: Secondary | ICD-10-CM | POA: Diagnosis not present

## 2023-01-30 LAB — URINALYSIS, ROUTINE W REFLEX MICROSCOPIC
Bilirubin Urine: NEGATIVE
Hgb urine dipstick: NEGATIVE
Ketones, ur: NEGATIVE
Nitrite: NEGATIVE
Specific Gravity, Urine: 1.025 (ref 1.000–1.030)
Total Protein, Urine: NEGATIVE
Urine Glucose: NEGATIVE
Urobilinogen, UA: 0.2 (ref 0.0–1.0)
pH: 7 (ref 5.0–8.0)

## 2023-01-31 LAB — URINE CULTURE
MICRO NUMBER:: 15548263
SPECIMEN QUALITY:: ADEQUATE

## 2023-02-18 ENCOUNTER — Ambulatory Visit (INDEPENDENT_AMBULATORY_CARE_PROVIDER_SITE_OTHER): Payer: Medicare PPO

## 2023-02-18 DIAGNOSIS — Z111 Encounter for screening for respiratory tuberculosis: Secondary | ICD-10-CM

## 2023-02-18 DIAGNOSIS — Z23 Encounter for immunization: Secondary | ICD-10-CM

## 2023-02-18 NOTE — Progress Notes (Signed)
PPD Placement note Charlton Amor, 72 y.o. female is here today for placement of PPD test Reason for PPD test: Pt is going to assistant living  Pt taken PPD test before: no Verified in allergy area and with patient that they are not allergic to the products PPD is made of (Phenol or Tween). Yes Is patient taking any oral or IV steroid medication now or have they taken it in the last month? no Has the patient ever received the BCG vaccine?: no Has the patient been in recent contact with anyone known or suspected of having active TB disease?: no  Patient's Country of origin?: [place] O: Alert and oriented in NAD. P:  PPD placed on 02/18/2023 in pt Right forearm.  Patient advised to return for reading within 48-72 hours.

## 2023-02-20 ENCOUNTER — Ambulatory Visit: Payer: Medicare PPO

## 2023-02-20 LAB — TB SKIN TEST
Induration: 0 mm
TB Skin Test: NEGATIVE

## 2023-02-20 NOTE — Progress Notes (Signed)
PPD Reading Note PPD read and results entered in EpicCare. Result: 0 mm induration. Interpretation: Negative If test not read within 48-72 hours of initial placement, patient advised to repeat in other arm 1-3 weeks after this test. Allergic reaction: no  Pt had a little bruise but no bump. Double check by Racheel V., CMA.

## 2023-02-28 ENCOUNTER — Telehealth: Payer: Self-pay | Admitting: Adult Health

## 2023-02-28 NOTE — Telephone Encounter (Signed)
Please advise 

## 2023-02-28 NOTE — Telephone Encounter (Signed)
Moving to assisted living.  Requesting [facility] order for DNR, they need the original. also OTC meds have to be distributed by staff, patient not allowed to have in room. Requesting a list allowing patient be able to request OTC meds to include tylenol, advil, tums, pepto bismol, imodium, cough syrup, sudafed, neosporin, hydrocortisone cream at providers discretion and any he feels should be added. Please fax this notice to [facility] [phone]. Daughter says to call if there are questions

## 2023-03-04 ENCOUNTER — Encounter: Payer: Self-pay | Admitting: Adult Health

## 2023-03-04 ENCOUNTER — Other Ambulatory Visit: Payer: Self-pay | Admitting: Adult Health

## 2023-03-04 NOTE — Telephone Encounter (Signed)
Pt daughter Valli Glance notified of [place]. Will fax the order to [facility] and  Valli Glance will pick up DNR form.

## 2023-03-04 NOTE — Telephone Encounter (Signed)
Noted  

## 2023-03-05 NOTE — Telephone Encounter (Signed)
Order was faxed with confirmation. DNR placed in front office filing cabinet.

## 2023-03-06 NOTE — Telephone Encounter (Signed)
Pt's daughter, Valli Glance, called in requesting for a list of the OTC medicines sent to assisted living to be uploaded into pt's MyChart.

## 2023-03-07 ENCOUNTER — Other Ambulatory Visit: Payer: Self-pay

## 2023-03-07 MED ORDER — PSEUDOEPHEDRINE HCL 30 MG PO TABS: 30.0000 mg | ORAL_TABLET | ORAL | Status: DC | PRN

## 2023-03-07 MED ORDER — IBUPROFEN 400 MG PO TABS: 400.0000 mg | ORAL_TABLET | Freq: Three times a day (TID) | ORAL | Status: DC | PRN

## 2023-03-07 MED ORDER — HYDROCORTISONE 2.5 % EX CREA: TOPICAL_CREAM | CUTANEOUS | Status: DC

## 2023-03-07 MED ORDER — LOPERAMIDE HCL 2 MG PO TABS: 2.0000 mg | ORAL_TABLET | Freq: Two times a day (BID) | ORAL | Status: DC | PRN

## 2023-03-07 MED ORDER — TRIPLE ANTIBIOTIC 3.5-400-5000 EX OINT: TOPICAL_OINTMENT | CUTANEOUS | Status: DC

## 2023-03-07 MED ORDER — CALCIUM CARBONATE ANTACID 500 MG PO CHEW: 2.0000 | CHEWABLE_TABLET | Freq: Four times a day (QID) | ORAL | Status: DC | PRN

## 2023-03-07 MED ORDER — ACETAMINOPHEN 325 MG PO TABS: 650.0000 mg | ORAL_TABLET | Freq: Three times a day (TID) | ORAL | Status: DC | PRN

## 2023-03-07 MED ORDER — DEXTROMETHORPHAN-GUAIFENESIN 5-100 MG/5ML PO LIQD: 10.0000 mL | Freq: Three times a day (TID) | ORAL | Status: DC | PRN

## 2023-03-07 MED ORDER — BISMUTH SUBSALICYLATE 525 MG/15ML PO SUSP: ORAL | Status: DC

## 2023-03-07 NOTE — Telephone Encounter (Signed)
This has been taking care of.

## 2023-03-07 NOTE — Telephone Encounter (Signed)
Called pt daughter to advise of update.

## 2023-03-12 ENCOUNTER — Other Ambulatory Visit: Payer: Self-pay

## 2023-03-12 ENCOUNTER — Encounter (HOSPITAL_COMMUNITY): Payer: Self-pay

## 2023-03-12 ENCOUNTER — Emergency Department (HOSPITAL_COMMUNITY)
Admission: EM | Admit: 2023-03-12 | Discharge: 2023-03-12 | Disposition: A | Discharge status: Skilled Nursing Facility | Payer: Medicare PPO | Attending: Emergency Medicine | Admitting: Emergency Medicine

## 2023-03-12 DIAGNOSIS — E876 Hypokalemia: Secondary | ICD-10-CM | POA: Diagnosis not present

## 2023-03-12 DIAGNOSIS — N390 Urinary tract infection, site not specified: Secondary | ICD-10-CM | POA: Insufficient documentation

## 2023-03-12 DIAGNOSIS — F0390 Unspecified dementia without behavioral disturbance: Secondary | ICD-10-CM | POA: Diagnosis not present

## 2023-03-12 DIAGNOSIS — R4182 Altered mental status, unspecified: Secondary | ICD-10-CM | POA: Diagnosis present

## 2023-03-12 LAB — COMPREHENSIVE METABOLIC PANEL
ALT: 25 U/L (ref 0–44)
AST: 22 U/L (ref 15–41)
Albumin: 4 g/dL (ref 3.5–5.0)
Alkaline Phosphatase: 59 U/L (ref 38–126)
Anion gap: 8 (ref 5–15)
BUN: 22 mg/dL (ref 8–23)
CO2: 29 mmol/L (ref 22–32)
Calcium: 9.9 mg/dL (ref 8.9–10.3)
Chloride: 102 mmol/L (ref 98–111)
Creatinine, Ser: 1 mg/dL (ref 0.44–1.00)
GFR, Estimated: 59 mL/min — ABNORMAL LOW (ref >60)
Glucose, Bld: 92 mg/dL (ref 70–99)
Potassium: 3.2 mmol/L — ABNORMAL LOW (ref 3.5–5.1)
Sodium: 139 mmol/L (ref 135–145)
Total Bilirubin: 0.6 mg/dL (ref <1.2)
Total Protein: 7.2 g/dL (ref 6.5–8.1)

## 2023-03-12 LAB — ETHANOL: Alcohol, Ethyl (B): <10 mg/dL (ref <10)

## 2023-03-12 LAB — CBC WITH DIFFERENTIAL/PLATELET
Abs Immature Granulocytes: 0.02 10*3/uL (ref 0.00–0.07)
Basophils Absolute: 0.1 10*3/uL (ref 0.0–0.1)
Basophils Relative: 1 %
Eosinophils Absolute: 0 10*3/uL (ref 0.0–0.5)
Eosinophils Relative: 1 %
HCT: 39.3 % (ref 36.0–46.0)
Hemoglobin: 12.8 g/dL (ref 12.0–15.0)
Immature Granulocytes: 0 %
Lymphocytes Relative: 21 %
Lymphs Abs: 1.6 10*3/uL (ref 0.7–4.0)
MCH: 30 pg (ref 26.0–34.0)
MCHC: 32.6 g/dL (ref 30.0–36.0)
MCV: 92.3 fL (ref 80.0–100.0)
Monocytes Absolute: 0.6 10*3/uL (ref 0.1–1.0)
Monocytes Relative: 8 %
Neutro Abs: 5.5 10*3/uL (ref 1.7–7.7)
Neutrophils Relative %: 69 %
Platelets: 252 10*3/uL (ref 150–400)
RBC: 4.26 MIL/uL (ref 3.87–5.11)
RDW: 13.2 % (ref 11.5–15.5)
WBC: 7.8 10*3/uL (ref 4.0–10.5)
nRBC: 0 % (ref 0.0–0.2)

## 2023-03-12 LAB — URINALYSIS, ROUTINE W REFLEX MICROSCOPIC
Bilirubin Urine: NEGATIVE
Glucose, UA: NEGATIVE mg/dL
Ketones, ur: NEGATIVE mg/dL
Nitrite: NEGATIVE
Protein, ur: NEGATIVE mg/dL
Specific Gravity, Urine: 1.006 (ref 1.005–1.030)
pH: 6 (ref 5.0–8.0)

## 2023-03-12 LAB — RAPID URINE DRUG SCREEN, HOSP PERFORMED
Amphetamines: NOT DETECTED
Barbiturates: NOT DETECTED
Benzodiazepines: NOT DETECTED
Cocaine: NOT DETECTED
Opiates: NOT DETECTED
Tetrahydrocannabinol: NOT DETECTED

## 2023-03-12 MED ORDER — POTASSIUM CHLORIDE CRYS ER 20 MEQ PO TBCR
40.0000 meq | EXTENDED_RELEASE_TABLET | Freq: Once | ORAL | Status: AC
  Administered 2023-03-12: 40 meq via ORAL
  Filled 2023-03-12: qty 2

## 2023-03-12 MED ORDER — CEPHALEXIN 500 MG PO CAPS: 500.0000 mg | ORAL_CAPSULE | Freq: Four times a day (QID) | ORAL | 0 refills | Status: DC

## 2023-03-12 MED ORDER — CEPHALEXIN 500 MG PO CAPS
500.0000 mg | ORAL_CAPSULE | Freq: Once | ORAL | Status: AC
  Administered 2023-03-12: 500 mg via ORAL
  Filled 2023-03-12: qty 1

## 2023-03-12 NOTE — ED Provider Notes (Signed)
[place] EMERGENCY DEPARTMENT AT [facility] Provider Note   CSN: [id number] Arrival date & time: 03/12/23  1250     History  Chief Complaint  Patient presents with   Psychiatric Evaluation    Emma Schneider is a 72 y.o. female.  The history is provided by the patient, the police and medical records. No language interpreter was used.    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  Patient was recently enlisted in [facility], a memory care unit for the past week.  The staff voiced that she is being uncooperative at the facility and "keep trying to leave to find her husband".  Patient was sent here for further psychiatric evaluation.  Home Medications Prior to Admission medications   Medication Sig Start Date End Date Taking? Authorizing Provider  acetaminophen (TYLENOL) 325 MG tablet Take 2 tablets (650 mg total) by mouth every 8 (eight) hours as needed. 03/07/23   Nafziger, Kandee Keen, NP  atorvastatin (LIPITOR) 10 MG tablet Take 1 tablet (10 mg total) by mouth daily. 04/05/22   Nafziger, Kandee Keen, NP  Bismuth Subsalicylate 525 MG/15ML SUSP Take 30 mls Q8hrs prn 03/07/23   Nafziger, Kandee Keen, NP  calcium carbonate (OS-CAL) 600 MG TABS tablet Take 2 tablets (1,200 mg total) by mouth daily. 10/22/22   Nafziger, Kandee Keen, NP  calcium carbonate (TUMS) 500 MG chewable tablet Chew 2 tablets (400 mg of elemental calcium total) by mouth every 6 (six) hours as needed for indigestion or heartburn. 03/07/23   Nafziger, Kandee Keen, NP  Cholecalciferol (VITAMIN D3) 25 MCG (1000 UT) CAPS Take 1 capsule (1,000 Units total) by mouth daily. 10/22/22   Nafziger, Kandee Keen, NP  Dextromethorphan-guaiFENesin 5-100 MG/5ML LIQD Take 10 mLs by mouth every 8 (eight) hours as needed. 03/07/23   Nafziger, Kandee Keen, NP  donepezil (ARICEPT)  10 MG tablet Take 1 tablet (10 mg total) by mouth at bedtime. 11/22/22   Marcos Eke, PA-C  FEROSUL 325 (65 Fe) MG tablet Take 325 mg by mouth daily. 01/16/23   [provider]  hydrocortisone 2.5%-Eucerin equivalent 1:1 cream mixture Use for minor rashes 03/07/23   Nafziger, Kandee Keen, NP  ibuprofen (ADVIL) 400 MG tablet Take 1 tablet (400 mg total) by mouth every 8 (eight) hours as needed. 03/07/23   Nafziger, Kandee Keen, NP  levothyroxine (SYNTHROID) 25 MCG tablet Take 1 tablet (25 mcg total) by mouth daily before breakfast. 04/05/22   Nafziger, Kandee Keen, NP  loperamide (IMODIUM A-D) 2 MG tablet Take 1 tablet (2 mg total) by mouth every 12 (twelve) hours as needed for diarrhea or loose stools. 03/07/23   Nafziger, Kandee Keen, NP  memantine [facility]) 10 MG tablet Take one tab two times a day 11/22/22   Marcos Eke, PA-C  mirtazapine (REMERON) 30 MG tablet Take 1 tablet (30 mg total) by mouth at bedtime. 12/04/22   Shirline Frees, NP  neomycin-bacitracin-polymyxin [phone] OINT Use for minor crapes and cuts 03/07/23   Nafziger, Kandee Keen, NP  nicotine polacrilex (NICORETTE) 2 MG gum SMARTSIG:1 Each By Mouth Every 8 Hours PRN 11/21/22   [provider]  pseudoephedrine (SUDAFED) 30 MG tablet Take 1 tablet (30 mg total) by mouth every 4 (four) hours as needed for congestion. 03/07/23   Nafziger, Kandee Keen, NP  Zoledronic Acid (ZOMETA) 4 MG/100ML IVPB 100 ml over 15 minutes 11/09/20   [provider]  Allergies    Wellbutrin [bupropion]    Review of Systems   Review of Systems  Unable to perform ROS: Dementia    Physical Exam Updated Vital Signs BP (!) 166/56 (BP Location: Left Arm)   Pulse 93   Temp 98.2 F (36.8 C) (Oral)   Resp 17   Ht 5\' 3"  (1.6 m)   Wt 45 kg   SpO2 100%   BMI 17.57 kg/m  Physical Exam Vitals and nursing note reviewed.  Constitutional:      General: She is not in acute distress.    Appearance: She is well-developed.     Comments: Patient is sitting in bed  appears to be in no acute discomfort.  HENT:     Head: Atraumatic.  Eyes:     Conjunctiva/sclera: Conjunctivae normal.  Cardiovascular:     Rate and Rhythm: Normal rate and regular rhythm.     Pulses: Normal pulses.     Heart sounds: Normal heart sounds.  Pulmonary:     Effort: Pulmonary effort is normal.  Abdominal:     Palpations: Abdomen is soft.  Musculoskeletal:     Cervical back: Neck supple.  Skin:    Findings: No rash.  Neurological:     Mental Status: She is alert. She is confused.     GCS: GCS eye subscore is 4. GCS verbal subscore is 5. GCS motor subscore is 6.     Cranial Nerves: Cranial nerves 2-12 are intact.     Sensory: Sensation is intact.     Motor: Motor function is intact.  Psychiatric:        Mood and Affect: Mood normal.        Speech: Speech normal.        Behavior: Behavior is cooperative.        Thought Content: Thought content is delusional. Thought content does not include homicidal or suicidal ideation.        Cognition and Memory: Cognition is impaired. She exhibits impaired recent memory.     ED Results / Procedures / Treatments   Labs (all labs ordered are listed, but only abnormal results are displayed) Labs Reviewed  COMPREHENSIVE METABOLIC PANEL - Abnormal; Notable for the following components:      Result Value   Potassium 3.2 (*)    GFR, Estimated 59 (*)    All other components within normal limits  URINALYSIS, ROUTINE W REFLEX MICROSCOPIC - Abnormal; Notable for the following components:   Color, Urine STRAW (*)    Hgb urine dipstick MODERATE (*)    Leukocytes,Ua MODERATE (*)    Bacteria, UA RARE (*)    All other components within normal limits  ETHANOL  RAPID URINE DRUG SCREEN, HOSP PERFORMED  CBC WITH DIFFERENTIAL/PLATELET    EKG None  Radiology No results found.  Procedures Procedures    Medications Ordered in ED Medications  cephALEXin (KEFLEX) capsule 500 mg (has no administration in time range)  potassium  chloride SA (KLOR-CON M) CR tablet 40 mEq (has no administration in time range)    ED Course/ Medical Decision Making/ A&P                                 Medical Decision Making Amount and/or Complexity of Data Reviewed Labs: ordered.   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  Patient was recently enlisted in [facility], a memory care unit for the past week.  The staff voiced that she is being uncooperative at the facility and "keep trying to leave to find her husband".  Patient was sent here for further psychiatric evaluation.  Exam overall reassuring, patient is resting comfortably appears to be in no acute discomfort.  She is not responding to any internal stimuli.  She is clearly demented but hard to say if this is more than her baseline.  She recently lost her husband suspect that may have worsening her dementia.  I was able to communicate with patient's daughter who is now at bedside.  Patient recently was moved to the memory care unit and approximately a week ago from her home.  She was needing more advanced care than what she previously could have got with her caregiver at home.  She did lost her husband 6 months ago.  Daughter felt her behaviors at baseline but due to nursing unit protocol, they requested to ensure patient does not have any underlying medical cause that could contribute to her confusion. They also would like to have PRN medication  Will perform medical screening exam.  Care discussed with Dr. Anitra Lauth  -Labs ordered, independently viewed and interpreted by me.  Labs remarkable for UA shows moderate Hgb  and moderate leuk with 6-10 WBC.  Will treat for UTI with keflex.  The remainder of her labs are reassuring.  -The patient was maintained on a cardiac monitor.  I personally viewed and interpreted the cardiac monitored which showed an underlying rhythm of: NSR -Imaging including head CT considered but felt low yield -This patient presents to the ED for concern of AMS, this involves an extensive number of treatment options, and is a complaint that carries with it a high risk of complications and morbidity.  The differential diagnosis includes dementia, UTI, electrolytes imbalance, stroke, MI -Co morbidities that complicate the patient evaluation includes dementia -Treatment includes keflex and potassium -Reevaluation of the patient after these medicines showed that the patient stayed the same -PCP office notes or outside notes reviewed -Discussion with attending DR. Plunkett -Escalation to admission/observation considered: patients feels much better, is comfortable with discharge, and will follow up with PCP -Prescription medication considered, patient comfortable with keflex -Social Determinant of Health considered which includes tobacco use, lack of mobility  I discussed her care with patient and with daughter at bedside.        Final Clinical Impression(s) / ED Diagnoses Final diagnoses:  Acute lower UTI  Hypokalemia    Rx / DC Orders ED Discharge Orders          Ordered    cephALEXin (KEFLEX) 500 MG capsule  4 times daily        03/12/23 1720              Fayrene Helper, PA-C 03/12/23 1720    Gwyneth Sprout, MD 03/19/23 1534

## 2023-03-12 NOTE — Discharge Instructions (Addendum)
You have been for your symptoms.  Your urine shows possible signs of a urinary tract infection.  Please take antibiotic as prescribed.  Your potassium level is a bit low today, have it rechecked by your doctor at your earliest convenience.

## 2023-03-12 NOTE — ED Triage Notes (Signed)
Patient BIB [employer] voluntarily. Patient new resident at [facility] facility for 1 week. Her husband recently died. She was uncooperative with staff at facility because she "kept trying to leave to find her husband." Was not listening to the facility so they sent her out for a psychiatric evaluation. Denies SI/HI.

## 2023-03-12 NOTE — ED Notes (Signed)
Pt seen and discharged by the PA.  Pt given her clothes back to change into.  Pts daughter in room and assisted pt.  Pt vitals updated and pt was given her meds due.  Printed D/c paperwork and daughter transporting pt back to facility with paperwork that was due to send back.  Pt has a DNR form that went back to the facility with the D/c paperwork.

## 2023-03-20 DIAGNOSIS — R41841 Cognitive communication deficit: Secondary | ICD-10-CM | POA: Diagnosis not present

## 2023-03-21 DIAGNOSIS — R41841 Cognitive communication deficit: Secondary | ICD-10-CM | POA: Diagnosis not present

## 2023-03-24 DIAGNOSIS — R41841 Cognitive communication deficit: Secondary | ICD-10-CM | POA: Diagnosis not present

## 2023-03-25 ENCOUNTER — Telehealth: Payer: Self-pay | Admitting: Adult Health

## 2023-03-25 DIAGNOSIS — R41841 Cognitive communication deficit: Secondary | ICD-10-CM | POA: Diagnosis not present

## 2023-03-25 NOTE — Telephone Encounter (Signed)
Checking on previously discussed order for PRN meds for patient. Requesting that order be faxed to [phone]

## 2023-03-25 NOTE — Telephone Encounter (Signed)
I sent this to pt as well as placed in pt mychart. Tried to call pt daughter but no answer. Also, if pt daughter go to pt mychart they will be able to get a list themselves.

## 2023-03-26 ENCOUNTER — Telehealth: Payer: Self-pay

## 2023-03-26 MED ORDER — CALCIUM CARBONATE ANTACID 500 MG PO CHEW: 2.0000 | CHEWABLE_TABLET | Freq: Four times a day (QID) | ORAL | 0 refills | Status: AC | PRN

## 2023-03-26 MED ORDER — ACETAMINOPHEN 325 MG PO TABS: 650.0000 mg | ORAL_TABLET | Freq: Three times a day (TID) | ORAL | 0 refills | Status: AC | PRN

## 2023-03-26 MED ORDER — DEXTROMETHORPHAN-GUAIFENESIN 5-100 MG/5ML PO LIQD: 10.0000 mL | Freq: Three times a day (TID) | ORAL | 0 refills | Status: AC | PRN

## 2023-03-26 MED ORDER — IBUPROFEN 400 MG PO TABS: 400.0000 mg | ORAL_TABLET | Freq: Three times a day (TID) | ORAL | 0 refills | Status: AC | PRN

## 2023-03-26 MED ORDER — PSEUDOEPHEDRINE HCL 30 MG PO TABS: 30.0000 mg | ORAL_TABLET | ORAL | 0 refills | Status: AC | PRN

## 2023-03-26 MED ORDER — LOPERAMIDE HCL 2 MG PO TABS: 2.0000 mg | ORAL_TABLET | Freq: Two times a day (BID) | ORAL | 0 refills | Status: AC | PRN

## 2023-03-26 NOTE — Addendum Note (Signed)
Addended by: Waymon Amato R on: 03/26/2023 12:03 PM   Modules accepted: Orders

## 2023-03-26 NOTE — Telephone Encounter (Signed)
Spoke to [place] and she stated that they need orders again for OTC Rx called to pharmacy.

## 2023-03-26 NOTE — Telephone Encounter (Signed)
Otc prescriptions printed to be faxed to preferred pharmacy [phone].

## 2023-03-31 ENCOUNTER — Telehealth: Payer: Self-pay | Admitting: Adult Health

## 2023-03-31 DIAGNOSIS — R41841 Cognitive communication deficit: Secondary | ICD-10-CM | POA: Diagnosis not present

## 2023-03-31 NOTE — Telephone Encounter (Signed)
Pt daughter is calling and pt is having agitation and mood swings  for awhile per daughter has discuss with cory in past. [facility]. Fax rx to [employer] number is [phone] and fax [phone]

## 2023-04-01 ENCOUNTER — Other Ambulatory Visit: Payer: Self-pay | Admitting: Adult Health

## 2023-04-01 MED ORDER — ARIPIPRAZOLE 5 MG PO TABS: 5.0000 mg | ORAL_TABLET | Freq: Every day | ORAL | 0 refills | Status: DC

## 2023-04-01 NOTE — Telephone Encounter (Signed)
Please advise 

## 2023-04-01 NOTE — Telephone Encounter (Signed)
Schneider,Emma notified of update and verbalized understanding. Sending Rx in now via fax.

## 2023-04-03 DIAGNOSIS — R41841 Cognitive communication deficit: Secondary | ICD-10-CM | POA: Diagnosis not present

## 2023-04-07 DIAGNOSIS — R41841 Cognitive communication deficit: Secondary | ICD-10-CM | POA: Diagnosis not present

## 2023-04-08 ENCOUNTER — Ambulatory Visit (INDEPENDENT_AMBULATORY_CARE_PROVIDER_SITE_OTHER): Payer: Medicare PPO | Admitting: Adult Health

## 2023-04-08 ENCOUNTER — Encounter: Payer: Self-pay | Admitting: Adult Health

## 2023-04-08 VITALS — BP 130/60 | HR 92 | Temp 97.6°F | Ht 63.0 in | Wt 102.0 lb

## 2023-04-08 DIAGNOSIS — Z0000 Encounter for general adult medical examination without abnormal findings: Secondary | ICD-10-CM

## 2023-04-08 DIAGNOSIS — E782 Mixed hyperlipidemia: Secondary | ICD-10-CM | POA: Diagnosis not present

## 2023-04-08 DIAGNOSIS — F419 Anxiety disorder, unspecified: Secondary | ICD-10-CM

## 2023-04-08 DIAGNOSIS — C9201 Acute myeloblastic leukemia, in remission: Secondary | ICD-10-CM | POA: Diagnosis not present

## 2023-04-08 DIAGNOSIS — D3501 Benign neoplasm of right adrenal gland: Secondary | ICD-10-CM

## 2023-04-08 DIAGNOSIS — G309 Alzheimer's disease, unspecified: Secondary | ICD-10-CM | POA: Diagnosis not present

## 2023-04-08 DIAGNOSIS — M8580 Other specified disorders of bone density and structure, unspecified site: Secondary | ICD-10-CM

## 2023-04-08 DIAGNOSIS — F17200 Nicotine dependence, unspecified, uncomplicated: Secondary | ICD-10-CM | POA: Diagnosis not present

## 2023-04-08 DIAGNOSIS — E559 Vitamin D deficiency, unspecified: Secondary | ICD-10-CM | POA: Diagnosis not present

## 2023-04-08 DIAGNOSIS — E039 Hypothyroidism, unspecified: Secondary | ICD-10-CM

## 2023-04-08 DIAGNOSIS — F32A Depression, unspecified: Secondary | ICD-10-CM

## 2023-04-08 DIAGNOSIS — F0280 Dementia in other diseases classified elsewhere without behavioral disturbance: Secondary | ICD-10-CM

## 2023-04-08 DIAGNOSIS — F5101 Primary insomnia: Secondary | ICD-10-CM

## 2023-04-08 LAB — COMPREHENSIVE METABOLIC PANEL
ALT: 18 U/L (ref 0–35)
AST: 20 U/L (ref 0–37)
Albumin: 4.2 g/dL (ref 3.5–5.2)
Alkaline Phosphatase: 61 U/L (ref 39–117)
BUN: 19 mg/dL (ref 6–23)
CO2: 33 meq/L — ABNORMAL HIGH (ref 19–32)
Calcium: 9.7 mg/dL (ref 8.4–10.5)
Chloride: 101 meq/L (ref 96–112)
Creatinine, Ser: 0.87 mg/dL (ref 0.40–1.20)
GFR: 66.16 mL/min (ref >60.00)
Glucose, Bld: 102 mg/dL — ABNORMAL HIGH (ref 70–99)
Potassium: 4.1 meq/L (ref 3.5–5.1)
Sodium: 141 meq/L (ref 135–145)
Total Bilirubin: 0.6 mg/dL (ref 0.2–1.2)
Total Protein: 7.1 g/dL (ref 6.0–8.3)

## 2023-04-08 LAB — CBC WITH DIFFERENTIAL/PLATELET
Basophils Absolute: 0 10*3/uL (ref 0.0–0.1)
Basophils Relative: 0.5 % (ref 0.0–3.0)
Eosinophils Absolute: 0 10*3/uL (ref 0.0–0.7)
Eosinophils Relative: 0.7 % (ref 0.0–5.0)
HCT: 41.1 % (ref 36.0–46.0)
Hemoglobin: 13.7 g/dL (ref 12.0–15.0)
Lymphocytes Relative: 24.3 % (ref 12.0–46.0)
Lymphs Abs: 1.8 10*3/uL (ref 0.7–4.0)
MCHC: 33.5 g/dL (ref 30.0–36.0)
MCV: 91.5 fL (ref 78.0–100.0)
Monocytes Absolute: 0.4 10*3/uL (ref 0.1–1.0)
Monocytes Relative: 5.6 % (ref 3.0–12.0)
Neutro Abs: 5.2 10*3/uL (ref 1.4–7.7)
Neutrophils Relative %: 68.9 % (ref 43.0–77.0)
Platelets: 364 10*3/uL (ref 150.0–400.0)
RBC: 4.49 Mil/uL (ref 3.87–5.11)
RDW: 13.7 % (ref 11.5–15.5)
WBC: 7.5 10*3/uL (ref 4.0–10.5)

## 2023-04-08 LAB — LIPID PANEL
Cholesterol: 181 mg/dL (ref 0–200)
HDL: 61.2 mg/dL (ref >39.00)
LDL Cholesterol: 87 mg/dL (ref 0–99)
NonHDL: 119.52
Total CHOL/HDL Ratio: 3
Triglycerides: 161 mg/dL — ABNORMAL HIGH (ref 0.0–149.0)
VLDL: 32.2 mg/dL (ref 0.0–40.0)

## 2023-04-08 LAB — VITAMIN D 25 HYDROXY (VIT D DEFICIENCY, FRACTURES): VITD: 35.41 ng/mL (ref 30.00–100.00)

## 2023-04-08 LAB — TSH: TSH: 1.6 u[IU]/mL (ref 0.35–5.50)

## 2023-04-08 MED ORDER — ALPRAZOLAM 0.25 MG PO TABS: 0.2500 mg | ORAL_TABLET | Freq: Two times a day (BID) | ORAL | 3 refills | Status: DC | PRN

## 2023-04-08 NOTE — Progress Notes (Signed)
Subjective:    Patient ID: Emma Schneider, female    DOB: 02-24-1951, 72 y.o.   MRN: [id number]  HPI  Patient presents for yearly preventative medicine examination. 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  Hyperlipidemia-managed with Lipitor 10 mg daily.  She denies fatigue, myalgia, or chest pain. Lab Results  Component Value Date   CHOL 136 04/04/2022   HDL 59.30 04/04/2022   LDLCALC 61 04/04/2022   TRIG 78.0 04/04/2022   CHOLHDL 2 04/04/2022    Osteopenia-currently managed with Actonel 35 mg IV yearly.  This is managed by GYN.  Her last bone density was in July 2022  Acute myeloid leukemia-diagnosed in 2000 with bone marrow biopsy.  In the past she has been managed by [facility] hematology/oncology on a yearly basis.  At this time she was released and was advised that her PCP managed with yearly CBCs as a risk for early relapse at this time was incredibly low.  She denies fevers, chills, anorexia, weight loss, nausea, vomiting, or diarrhea  Right adrenal adenoma-followed by [facility] endocrinology  Hypothyroidism-managed with Synthroid 25 mcg daily  Tobacco use-she is happy to report that she quit smoking about 2 months ago.   Vitamin D deficiency-takes vitamin D3 1000 units and calcium daily  Alzheimer's dementia- Managed by neurology.   MRI of the brain in January 2023 was remarkable for generalized atrophy, negative for hydrocephalus, and with mild to moderate microvascular ischemic  changes.  Neuropsychiatry evaluation with Dr. Vernell Barrier is concerning for dementia due to Alzheimer's disease.  She is currently managed with Aricept 10 mg daily and Namenda 10 mg BID.  She is no longer driving but is doing the rest of her ADL's.  She has trouble remembering instructions and he will have to repeat himself multiple times.  She is also unable to watch TV movies that she loses interest  due to not being able to comprehend or follow the story.  She does become frustrated from time to time.  She recently moved into an assisted living facility at [facility]. She is making friends and have started to enjoy the facility but her daughter reports that Fallon will have episodes of high anxiety and agitation where she wants to leave the facility or when she finds out that her deceased husband has been having sex with all the women in the facility.  He was started on Abilify 5 mg about a week ago, family feels as though it is too early to tell if this is helping.  These episodes have been becoming less and less but her family would like her to have PRN anxiety medication   Anxiety/Depression/Insomnia - She is maintained on Remeron 15 . Her husband reports that she is sleeping well, anxiety is controlled and her appetite is good.   All immunizations and health maintenance protocols were reviewed with the patient and needed orders were placed.  Appropriate screening laboratory values were ordered for the patient including screening of hyperlipidemia, renal function and hepatic function.   Medication reconciliation,  past medical history, social history, problem list and allergies were reviewed in detail with the patient  Goals  were established with regard to weight loss, exercise, and  diet in compliance with medications Wt Readings from Last 3 Encounters:  04/08/23 102 lb (46.3 kg)  03/12/23 99 lb 3.3 oz (45 kg)  01/28/23 100 lb (45.4 kg)      Review of Systems  Constitutional: Negative.    HENT: Negative.    Eyes: Negative.   Respiratory: Negative.    Cardiovascular: Negative.   Gastrointestinal: Negative.   Endocrine: Negative.   Genitourinary: Negative.   Musculoskeletal: Negative.   Skin: Negative.   Allergic/Immunologic: Negative.   Neurological: Negative.   Hematological: Negative.   Psychiatric/Behavioral:  Positive for agitation, confusion and decreased concentration.    Past Medical History:  Diagnosis Date   Adenoma of right adrenal gland 12/16/2014   Adrenal incidentaloma (HCC)    AML (acute myeloid leukemia) in remission 09/28/1998   completed tx 01/1999 - remission since, sees heme-onc at [facility] annually. Dr. 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 Will repeat dexa this year and Rx risidronate in the mean time. Asked pt to find out from PCP if they had prescribed Actonel prior so we can determine how many year   Personal history of colonic polyps 09/27/2016   3 polyps removed, repeated 02/2020, due 3 yrs   Thyroid dysfunction 05/11/2019   dx 02/2019, started on Euthyrox   Tobacco use 01/06/2015   Vitamin D deficiency     Social History   Socioeconomic History   Marital status: Married    Spouse name: Not on file   Number of children: Not on file   Years of education: 13   Highest education level: Some college, no degree  Occupational History   Occupation: Retired    Comment: Sales executive  Tobacco Use   Smoking status: Every Day    Current packs/day: 1.00    Average packs/day: 1 pack/day for 42.0 years (42.0 ttl pk-yrs)     Types: Cigarettes   Smokeless tobacco: Never   Tobacco comments:    Is actively trying to cut down number of cigarettes, but not ready to set a quit date. 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    -Married for 30 years    - Has step children, all live in [place]   - No pets   - Plays games on the computer and enjoys yard work.    Two story home   Caffeine a lot of pepsi   Social Determinants of Health   Financial Resource Strain: Low Risk  (05/07/2022)   Overall Financial Resource Strain (CARDIA)    Difficulty of Paying Living Expenses: Not hard at all  Food Insecurity: No Food Insecurity (05/07/2022)   Hunger Vital  Sign    Worried About Programme researcher, broadcasting/film/video in the Last Year: Never true    Ran Out of Food in the Last Year: Never true  Transportation Needs: No Transportation Needs (05/07/2022)   PRAPARE - Administrator, Civil Service (Medical): No    Lack of Transportation (Non-Medical): No  Physical Activity: Inactive (05/07/2022)   Exercise Vital Sign    Days of Exercise per Week: 0 days    Minutes of Exercise per Session: 0 min  Stress: No Stress Concern Present (05/07/2022)   [employer] of Occupational Health - Occupational Stress Questionnaire    Feeling of Stress : Not at all  Social Connections: Socially Integrated (05/07/2022)   Social Connection and Isolation Panel [NHANES]    Frequency of Communication with Friends and Family: More than three times a week    Frequency of Social Gatherings with Friends and Family: More than three times a week    Attends Religious Services: More than 4 times per year    Active Member of [employer] or Organizations: Yes    Attends Banker Meetings: More than  4 times per year    Marital Status: Married  Catering manager Violence: Not At Risk (05/07/2022)   Humiliation, Afraid, Rape, and Kick questionnaire    Fear of Current or Ex-Partner: No    Emotionally Abused: No    Physically Abused: No    Sexually Abused: No    No past surgical history on file.  Family History  Problem Relation Age of Onset   Congestive Heart Failure Mother    Dementia Mother        late in life   Heart attack Father    Heart disease Father     Allergies  Allergen Reactions   Wellbutrin [Bupropion] Other (See Comments)    Hallucinations     Current Outpatient Medications on File Prior to Visit  Medication Sig Dispense Refill   acetaminophen (TYLENOL) 325 MG tablet Take 2 tablets (650 mg total) by mouth every 8 (eight) hours as needed. 30 tablet 0   ARIPiprazole (ABILIFY) 5 MG tablet Take 1 tablet (5 mg total) by mouth daily. 30 tablet 0   atorvastatin (LIPITOR) 10 MG tablet Take 1 tablet (10 mg total) by mouth daily. 90 tablet 3   calcium carbonate (OS-CAL) 600 MG TABS tablet Take 2 tablets (1,200 mg total) by mouth daily. 90 tablet 3   calcium carbonate (TUMS) 500 MG chewable tablet Chew 2 tablets (400 mg of elemental calcium total) by mouth every 6 (six) hours as needed for indigestion or heartburn. 60 tablet 0   Cholecalciferol (VITAMIN D3) 25 MCG (1000 UT) CAPS Take 1 capsule (1,000 Units total) by mouth daily. 90 capsule 3   Dextromethorphan-guaiFENesin 5-100 MG/5ML LIQD Take 10 mLs by mouth every 8 (eight) hours as needed. 10 mL 0   donepezil (ARICEPT) 10 MG tablet Take 1 tablet (10 mg total) by mouth at bedtime. 90 tablet 4   FEROSUL 325 (65 Fe) MG tablet Take 325 mg by mouth daily.     ibuprofen (ADVIL) 400 MG tablet Take 1 tablet (400 mg total) by mouth every 8 (eight) hours as needed. 30 tablet 0   levothyroxine (SYNTHROID) 25 MCG tablet Take 1 tablet (25 mcg total) by mouth daily before breakfast. 90 tablet 3   loperamide (IMODIUM A-D) 2 MG tablet  Take 1 tablet (2 mg total) by mouth every 12 (twelve) hours as needed for diarrhea or loose  stools. 30 tablet 0   memantine (NAMENDA) 10 MG tablet Take one tab two times a day 60 tablet 11   mirtazapine (REMERON) 30 MG tablet Take 1 tablet (30 mg total) by mouth at bedtime. 90 tablet 1   pseudoephedrine (SUDAFED) 30 MG tablet Take 1 tablet (30 mg total) by mouth every 4 (four) hours as needed for congestion. 30 tablet 0   No current facility-administered medications on file prior to visit.    BP 130/60   Pulse 92   Temp 97.6 F (36.4 C) (Oral)   Ht 5\' 3"  (1.6 m)   Wt 102 lb (46.3 kg)   SpO2 98%   BMI 18.07 kg/m        Objective:   Physical Exam Vitals and nursing note reviewed.  Constitutional:      General: She is not in acute distress.    Appearance: Normal appearance. She is not ill-appearing.  HENT:     Head: Normocephalic and atraumatic.     Right Ear: Tympanic membrane, ear canal and external ear normal. There is no impacted cerumen.     Left Ear: Tympanic membrane, ear canal and external ear normal. There is no impacted cerumen.     Nose: Nose normal. No congestion or rhinorrhea.     Mouth/Throat:     Mouth: Mucous membranes are moist.     Pharynx: Oropharynx is clear.  Eyes:     Extraocular Movements: Extraocular movements intact.     Conjunctiva/sclera: Conjunctivae normal.     Pupils: Pupils are equal, round, and reactive to light.  Neck:     Vascular: No carotid bruit.  Cardiovascular:     Rate and Rhythm: Normal rate and regular rhythm.     Pulses: Normal pulses.     Heart sounds: No murmur heard.    No friction rub. No gallop.  Pulmonary:     Effort: Pulmonary effort is normal.     Breath sounds: Normal breath sounds.  Abdominal:     General: Abdomen is flat. Bowel sounds are normal. There is no distension.     Palpations: Abdomen is soft. There is no mass.     Tenderness: There is no abdominal tenderness. There is no guarding or rebound.     Hernia:  No hernia is present.  Musculoskeletal:        General: Normal range of motion.     Cervical back: Normal range of motion and neck supple.  Lymphadenopathy:     Cervical: No cervical adenopathy.  Skin:    General: Skin is warm and dry.     Capillary Refill: Capillary refill takes less than 2 seconds.  Neurological:     General: No focal deficit present.     Mental Status: She is alert and oriented to person, place, and time.  Psychiatric:        Mood and Affect: Mood normal.        Speech: Speech normal.        Behavior: Behavior normal.        Thought Content: Thought content normal.        Cognition and Memory: Cognition is impaired. Memory is impaired. She exhibits impaired recent memory and impaired remote memory.        Judgment: Judgment normal.       Assessment & Plan:  1. Routine general medical examination at a health care facility Today patient counseled on age appropriate routine health concerns for screening and prevention, each reviewed and  up to date or declined. Immunizations reviewed and up to date or declined. Labs ordered and reviewed. Risk factors for depression reviewed and negative. Hearing function and visual acuity are intact. ADLs screened and addressed as needed. Functional ability and level of safety reviewed and appropriate. Education, counseling and referrals performed based on assessed risks today. Patient provided with a copy of personalized plan for preventive services. - Follow up in one year for CPE  - Follow up in 6 months for recheck   2. Mixed hyperlipidemia - Consider increase in statin  - CBC with Differential/Platelet; Future - Comprehensive metabolic panel; Future - Lipid panel; Future - TSH; Future  3. Osteopenia, unspecified location - Per GYN  - CBC with Differential/Platelet; Future - Comprehensive metabolic panel; Future - Lipid panel; Future - TSH; Future  4. AML (acute myeloid leukemia) in remission  - CBC with  Differential/Platelet; Future - Comprehensive metabolic panel; Future - Lipid panel; Future - TSH; Future  5. Adenoma of right adrenal gland - Per Endo - CBC with Differential/Platelet; Future - Comprehensive metabolic panel; Future - Lipid panel; Future - TSH; Future  6. Hypothyroidism, unspecified type - consider dose change of synthroid  - CBC with Differential/Platelet; Future - Comprehensive metabolic panel; Future - Lipid panel; Future - TSH; Future  7. Tobacco use - Congratulated on quitting  - CBC with Differential/Platelet; Future - Comprehensive metabolic panel; Future - Lipid panel; Future - TSH; Future  8. Vitamin D deficiency  - VITAMIN D 25 Hydroxy (Vit-D Deficiency, Fractures); Future  9. Dementia likely due to Alzheimer's disease - Continue with Namanda and Aricept  -Continue with Abilify.  Advised family to let me know in the next couple weeks how she is doing - CBC with Differential/Platelet; Future - Comprehensive metabolic panel; Future - Lipid panel; Future - TSH; Future - Urinalysis; Future - Urine Culture; Future  10. Anxiety and depression - Continue with Remeron and will provide as needed Xanax 0.25 mg  - CBC with Differential/Platelet; Future - Comprehensive metabolic panel; Future - Lipid panel; Future - TSH; Future - Urinalysis; Future - Urine Culture; Future - ALPRAZolam (XANAX) 0.25 MG tablet; Take 1 tablet (0.25 mg total) by mouth 2 (two) times daily as needed for anxiety.  Dispense: 30 tablet; Refill: 3  11. Primary insomnia - Continue with Remeron  - CBC with Differential/Platelet; Future - Comprehensive metabolic panel; Future - Lipid panel; Future - TSH; Future  Shirline Frees, NP

## 2023-04-08 NOTE — Patient Instructions (Signed)
It was great seeing you today   We will follow up with you regarding your lab work   Please let me know if you need anything   

## 2023-04-09 DIAGNOSIS — R41841 Cognitive communication deficit: Secondary | ICD-10-CM | POA: Diagnosis not present

## 2023-04-10 LAB — URINE CULTURE
MICRO NUMBER:: 15831913
SPECIMEN QUALITY:: ADEQUATE

## 2023-04-14 DIAGNOSIS — R41841 Cognitive communication deficit: Secondary | ICD-10-CM | POA: Diagnosis not present

## 2023-04-16 DIAGNOSIS — R41841 Cognitive communication deficit: Secondary | ICD-10-CM | POA: Diagnosis not present

## 2023-04-21 DIAGNOSIS — R41841 Cognitive communication deficit: Secondary | ICD-10-CM | POA: Diagnosis not present

## 2023-04-25 DIAGNOSIS — R41841 Cognitive communication deficit: Secondary | ICD-10-CM | POA: Diagnosis not present

## 2023-05-01 DIAGNOSIS — R41841 Cognitive communication deficit: Secondary | ICD-10-CM | POA: Diagnosis not present

## 2023-05-08 DIAGNOSIS — R41841 Cognitive communication deficit: Secondary | ICD-10-CM | POA: Diagnosis not present

## 2023-05-27 ENCOUNTER — Encounter: Payer: Self-pay | Admitting: Adult Health

## 2023-05-27 ENCOUNTER — Ambulatory Visit: Payer: Medicare PPO | Admitting: Physician Assistant

## 2023-05-27 DIAGNOSIS — G309 Alzheimer's disease, unspecified: Secondary | ICD-10-CM

## 2023-05-27 DIAGNOSIS — J209 Acute bronchitis, unspecified: Secondary | ICD-10-CM | POA: Diagnosis not present

## 2023-05-27 DIAGNOSIS — F0280 Dementia in other diseases classified elsewhere without behavioral disturbance: Secondary | ICD-10-CM

## 2023-05-27 NOTE — Patient Instructions (Signed)
It was a pleasure to see you today at our office.   Recommendations:  Follow up in  6 months   Continue donepezil to 10 mg daily. Side effects were discussed  Continue Memantine 10 mg increase it to 2 times a day     Whom to call:  Memory  decline, memory medications: Call our office [phone]   For psychiatric meds, mood meds: Please have your primary care physician manage these medications.     For assessment of decision of mental capacity and competency:  Call Dr. Erick Blinks, geriatric psychiatrist at [phone]    For guidance regarding [facility]   Sidney Ace, Social Worker tel: [phone]  If you have any severe symptoms of a stroke, or other severe issues such as confusion,severe chills or fever, etc call 911 or go to the ER as you may need to be evaluated further       RECOMMENDATIONS FOR ALL PATIENTS WITH MEMORY PROBLEMS: 1. Continue to exercise (Recommend 30 minutes of walking everyday, or 3 hours every week) 2. Increase social interactions - continue going to [place] and enjoy social gatherings with friends and family 3. Eat healthy, avoid fried foods and eat more fruits and vegetables 4. Maintain adequate blood pressure, blood sugar, and blood cholesterol level. Reducing the risk of stroke and cardiovascular disease also helps promoting better memory. 5. Avoid stressful situations. Live a simple life and avoid aggravations. Organize your time and prepare for the next day in anticipation. 6. Sleep well, avoid any interruptions of sleep and avoid any distractions in the bedroom that may interfere with adequate sleep quality 7. Avoid sugar, avoid sweets as there is a strong link between excessive sugar intake, diabetes, and cognitive impairment We discussed the Mediterranean diet, which has been shown to help patients reduce the risk of progressive memory disorders and reduces cardiovascular risk. This includes eating fish, eat fruits  and green leafy vegetables, nuts like almonds and hazelnuts, walnuts, and also use olive oil. Avoid fast foods and fried foods as much as possible. Avoid sweets and sugar as sugar use has been linked to worsening of memory function.  There is always a concern of gradual progression of memory problems. If this is the case, then we may need to adjust level of care according to patient needs. Support, both to the patient and caregiver, should then be put into place.    The Alzheimer's Association is here all day, every day for people facing Alzheimer's disease through our free 24/7 Helpline: [phone]) [phone]. The Helpline provides reliable information and support to all those who need assistance, such as individuals living with memory loss, Alzheimer's or other dementia, caregivers, health care professionals and the public.  Our highly trained and knowledgeable staff can help you with: Understanding memory loss, dementia and Alzheimer's  Medications and other treatment options  General information about aging and brain health  Skills to provide quality care and to find the best care from professionals  Legal, financial and living-arrangement decisions Our Helpline also features: Confidential care consultation provided by master's level clinicians who can help with decision-making support, crisis assistance and education on issues families face every day  Help in a caller's preferred language using our translation service that features more than 200 languages and dialects  Referrals to local community programs, services and ongoing support     FALL PRECAUTIONS: Be cautious when walking. Scan the area for obstacles that may increase the risk of trips and falls. When getting  up in the mornings, sit up at the edge of the bed for a few minutes before getting out of bed. Consider elevating the bed at the head end to avoid drop of blood pressure when getting up. Walk always in a well-lit room (use night lights  in the walls). Avoid area rugs or power cords from appliances in the middle of the walkways. Use a walker or a cane if necessary and consider physical therapy for balance exercise. Get your eyesight checked regularly.  FINANCIAL OVERSIGHT: Supervision, especially oversight when making financial decisions or transactions is also recommended.  HOME SAFETY: Consider the safety of the kitchen when operating appliances like stoves, microwave oven, and blender. Consider having supervision and share cooking responsibilities until no longer able to participate in those. Accidents with firearms and other hazards in the house should be identified and addressed as well.   ABILITY TO BE LEFT ALONE: If patient is unable to contact 911 operator, consider using LifeLine, or when the need is there, arrange for someone to stay with patients. Smoking is a fire hazard, consider supervision or cessation. Risk of wandering should be assessed by caregiver and if detected at any point, supervision and safe proof recommendations should be instituted.  MEDICATION SUPERVISION: Inability to self-administer medication needs to be constantly addressed. Implement a mechanism to ensure safe administration of the medications.     Mediterranean Diet A Mediterranean diet refers to food and lifestyle choices that are based on the traditions of countries located on the [employer]. This way of eating has been shown to help prevent certain conditions and improve outcomes for people who have chronic diseases, like kidney disease and heart disease. What are tips for following this plan? Lifestyle  Cook and eat meals together with your family, when possible. Drink enough fluid to keep your urine clear or pale yellow. Be physically active every day. This includes: Aerobic exercise like running or swimming. Leisure activities like gardening, walking, or housework. Get 7-8 hours of sleep each night. If recommended by your health  care provider, drink red wine in moderation. This means 1 glass a day for nonpregnant women and 2 glasses a day for men. A glass of wine equals 5 oz (150 mL). Reading food labels  Check the serving size of packaged foods. For foods such as rice and pasta, the serving size refers to the amount of cooked product, not dry. Check the total fat in packaged foods. Avoid foods that have saturated fat or trans fats. Check the ingredients list for added sugars, such as corn syrup. Shopping  At the grocery store, buy most of your food from the areas near the walls of the store. This includes: Fresh fruits and vegetables (produce). Grains, beans, nuts, and seeds. Some of these may be available in unpackaged forms or large amounts (in bulk). Fresh seafood. Poultry and eggs. Low-fat dairy products. Buy whole ingredients instead of prepackaged foods. Buy fresh fruits and vegetables in-season from local farmers markets. Buy frozen fruits and vegetables in resealable bags. If you do not have access to quality fresh seafood, buy precooked frozen shrimp or canned fish, such as tuna, salmon, or sardines. Buy small amounts of raw or cooked vegetables, salads, or olives from the deli or salad bar at your store. Stock your pantry so you always have certain foods on hand, such as olive oil, canned tuna, canned tomatoes, rice, pasta, and beans. Cooking  Cook foods with extra-virgin olive oil instead of using butter or other vegetable  oils. Have meat as a side dish, and have vegetables or grains as your main dish. This means having meat in small portions or adding small amounts of meat to foods like pasta or stew. Use beans or vegetables instead of meat in common dishes like chili or lasagna. Experiment with different cooking methods. Try roasting or broiling vegetables instead of steaming or sauteing them. Add frozen vegetables to soups, stews, pasta, or rice. Add nuts or seeds for added healthy fat at each meal.  You can add these to yogurt, salads, or vegetable dishes. Marinate fish or vegetables using olive oil, lemon juice, garlic, and fresh herbs. Meal planning  Plan to eat 1 vegetarian meal one day each week. Try to work up to 2 vegetarian meals, if possible. Eat seafood 2 or more times a week. Have healthy snacks readily available, such as: Vegetable sticks with hummus. Greek yogurt. Fruit and nut trail mix. Eat balanced meals throughout the week. This includes: Fruit: 2-3 servings a day Vegetables: 4-5 servings a day Low-fat dairy: 2 servings a day Fish, poultry, or lean meat: 1 serving a day Beans and legumes: 2 or more servings a week Nuts and seeds: 1-2 servings a day Whole grains: 6-8 servings a day Extra-virgin olive oil: 3-4 servings a day Limit red meat and sweets to only a few servings a month What are my food choices? Mediterranean diet Recommended Grains: Whole-grain pasta. Brown rice. Bulgar wheat. Polenta. Couscous. Whole-wheat bread. Orpah Cobb. Vegetables: Artichokes. Beets. Broccoli. Cabbage. Carrots. Eggplant. Green beans. Chard. Kale. Spinach. Onions. Leeks. Peas. Squash. Tomatoes. Peppers. Radishes. Fruits: Apples. Apricots. Avocado. Berries. Bananas. Cherries. Dates. Figs. Grapes. Lemons. Melon. Oranges. Peaches. Plums. Pomegranate. Meats and other protein foods: Beans. Almonds. Sunflower seeds. Pine nuts. Peanuts. Cod. Salmon. Scallops. Shrimp. Tuna. Tilapia. Clams. Oysters. Eggs. Dairy: Low-fat milk. Cheese. Greek yogurt. Beverages: Water. Red wine. Herbal tea. Fats and oils: Extra virgin olive oil. Avocado oil. Grape seed oil. Sweets and desserts: [place] yogurt with honey. Baked apples. Poached pears. Trail mix. Seasoning and other foods: Basil. Cilantro. Coriander. Cumin. Mint. Parsley. Sage. Rosemary. Tarragon. Garlic. Oregano. Thyme. Pepper. Balsalmic vinegar. Tahini. Hummus. Tomato sauce. Olives. Mushrooms. Limit these Grains: Prepackaged pasta or rice  dishes. Prepackaged cereal with added sugar. Vegetables: Deep fried potatoes (french fries). Fruits: Fruit canned in syrup. Meats and other protein foods: Beef. Pork. Lamb. Poultry with skin. Hot dogs. Tomasa Blase. Dairy: Ice cream. Sour cream. Whole milk. Beverages: Juice. Sugar-sweetened soft drinks. Beer. Liquor and spirits. Fats and oils: Butter. Canola oil. Vegetable oil. Beef fat (tallow). Lard. Sweets and desserts: Cookies. Cakes. Pies. Candy. Seasoning and other foods: Mayonnaise. Premade sauces and marinades. The items listed may not be a complete list. Talk with your dietitian about what dietary choices are right for you. Summary The Mediterranean diet includes both food and lifestyle choices. Eat a variety of fresh fruits and vegetables, beans, nuts, seeds, and whole grains. Limit the amount of red meat and sweets that you eat. Talk with your health care provider about whether it is safe for you to drink red wine in moderation. This means 1 glass a day for nonpregnant women and 2 glasses a day for men. A glass of wine equals 5 oz (150 mL). This information is not intended to replace advice given to you by your health care provider. Make sure you discuss any questions you have with your health care provider. Document Released: 12/07/2015 Document Revised: 01/09/2016 Document Reviewed: 12/07/2015 Elsevier Interactive Patient Education  2017 [employer].

## 2023-05-27 NOTE — Progress Notes (Signed)
Assessment/Plan:   Dementia likely due to Alzheimer's Disease  Emma Schneider is a delightful 73 y.o. 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 Patient is currently on donepezil 10 mg daily and memantine 10 mg twice daily.  Cognitive decline is noted, and she needs more assistance with her ADLs than prior.  She lives at [facility] assisted living.  We discussed memory care, which would be more beneficial to her for social and cognitive stimulation as well as for 24/7 safety.  Daughters are interested, will entertain that idea.   Follow up in  6 months. Continue donepezil 10 mg daily and memantine milligrams twice daily, side effects discussed Recommend good control of her cardiovascular risk factors Continue to control mood as per PCP Recommend 24/7 monitoring Recommend transferring from assisted living to memory care unit at [facility]    Subjective:    This patient is accompanied in the office by her daughters  who supplements the history.  Previous records as well as any outside records available were reviewed prior to todays visit. Patient was last seen on 11/22/2022 with an MMSE of 15/30.   Any changes in memory since last visit? "  Worse over the last 2 months "-daughters say.  She has difficulty remembering recent conversations and names of people she is just meets.  She tries to avoid conversations because she is aware of it.  In the recent past she had done speech therapy for cognitive therapy but she was released because "it was more of a hindrance than help for speech therapist report ".  LTM is more affected than before "there is less familiarity to things "-daughter says.  "She is less self-directed ".  She has recently moved to [facility] AL unit. " Seemingly participating Bingo, Bible studies, Neomia Dear, goes  out to eat,likes to help at the front desk or setting up for events.  Repeats oneself?  Endorsed Disoriented when walking into a room?  Patient denies    Leaving objects?  May misplace things but not in unusual places   Wandering behavior?  On November 2024, [employer] sent her to the ER as she was trying to leave the room the facility to find her husband.  Any personality changes since last visit?  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    Any worsening depression?:  Denies.   Hallucinations or paranoia? "One time late November she  thought her husband was " sitting  out there she does not remember the reality of that anymore".   Seizures? denies    Any sleep changes?  Sleeps well.  Denies vivid dreams, REM behavior or sleepwalking   Sleep apnea?   Denies.   Any hygiene concerns?  Needs to be reminded, she needs to be prompted for bathing and for dressing.  Aides help her twice a week. Independent of bathing and dressing?  She needs assistance.   Does the patient needs help with medications?  Facility is in charge. Who is in charge of the finances?  Daughter is in charge     Any changes in appetite?  She has lost some weight as she is not going down for the meals. Patient have trouble swallowing? Denies.   Does the patient cook?  "Not as much " Any headaches?   denies  Chronic back pain  denies   Ambulates with difficulty? Denies.    Recent falls or head injuries? denies     Unilateral weakness, numbness or tingling? denies   Any tremors?  Denies   Any anosmia?  Denies   Any incontinence of urine?  Endorsed she had a UTI November 2024. Any bowel dysfunction?   ? Stool incontinence  Patient lives at [facility] assisted living Does the patient drive? No longer drives     Prior MRI brain January 2023 personally reviewed was remarkable for generalized atrophy, and mild to moderate microvascular ischemic changes.     Initial evaluation on 11/15/2021    How long did patient have memory difficulties?  Her family report that the memory issues have been present for about 5 years worse since about Oct 2022. She also has greater difficulty with reading comprehension.  She has more difficulty with crossword puzzles and word finding.  Long-term memory is normal. There are difficulties with multitasking, organization and impulsivity. Patient lives with: Spouse  repeats oneself? Endorsed "occasionally", although her husband says that this is more frequent than prior. Disoriented when walking into a room?  "It can be a scary situation if she is in a new environment".   Leaving objects in unusual places?  Patient denies   Ambulates  with difficulty?  "Getting a little more wobbly than before ". She has to think ahead if she is in uneven ground.   Recent falls?  Patient denies   Any head injuries?  Patient denies   History of seizures?   Patient denies   Wandering behavior?  Patient denies   Patient drives?  Her husband does not allow her to drive alone, and she only drives very short distances, preventing major intersections. Any mood changes?  Family report more impulsivity, she becomes irritable more quickly, especially when confronted.  She was taking Wellbutrin which was discontinued due to hallucinations. Any history of depression?:  Denies Hallucinations? Around 03/2021 she began having visual hallucinations, such as seeing dogs and cats under her porch, and animals roaming through the house, but after discontinuing Wellbutrin these hallucinations disappeared.  Paranoia? About 1 year ago it was reported the patient began accusing her husband of having affairs.  This has subsided. Patient reports that she sleeps well without vivid dreams, REM behavior or sleepwalking    History of sleep apnea?  Patient denies   Any hygiene concerns?  Patient denies   Independent of bathing and dressing?  Endorsed  Does the patient needs help with medications?   Husband is in charge Who is in charge of the finances?  Husband is in charge    Any changes in appetite?  Patient denies   Patient have trouble swallowing? Patient denies   Does the patient cook?  Patient denies .  "Has forgotten common recipes for the last year, has become more confused between the tsp and Tsp " Any kitchen accidents such as leaving the stove on? Patient denies   Any headaches?  Only when stressed. Double vision? Patient denies   Any focal numbness or tingling?  Patient denies   Chronic back pain Patient denies   Unilateral weakness?  Patient denies   Any tremors?  Patient denies   Any history of anosmia?  Patient denies   Any incontinence of urine?  Patient denies   Any bowel dysfunction?   Patient denies      History of heavy alcohol intake?  Patient denies   History of heavy  tobacco use?  Endorsed Family history of dementia?  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 She continues to smoke   Neuropsych evaluation 10/2021 Dr. Milbert Coulter  Briefly, results suggested primary impairments surrounding semantic fluency, confrontation naming, and essentially all aspects of learning and memory. Additional weaknesses were exhibited across complex attention, receptive language, and visuospatial abilities, while variability was exhibited across processing speed, basic attention, and executive functioning. Regarding etiology, I unfortunately have concerns surrounding underlying Alzheimer's disease. Ms. Dilger did not benefit from repeated exposure to information across learning trials, performed poorly across delayed recall trials, and performed poorly across yes/no recognition trials. Taken together, this suggests evidence for rapid forgetting and an evolving and already quite significant memory storage impairment, both of which are hallmark characteristics of this illness. Additional impairments across confrontation naming and semantic fluency align very well with this illness as they  are the expected domains to exhibit impairment following early memory dysfunction. Visuospatial weakness also aligns well with typical disease progression. While I am certainly not able to ascertain the validity of her allegations surrounding her husband's behaviors, both her husband and their daughter denied these actions and the emergence of suspicion and delusional thinking is certainly seen in individuals with Alzheimer's disease.    PREVIOUS MEDICATIONS:   CURRENT MEDICATIONS:  Outpatient Encounter Medications as of 05/27/2023  Medication Sig   acetaminophen (TYLENOL) 325 MG tablet Take 2 tablets (650 mg total) by mouth every 8 (eight) hours as needed.   ALPRAZolam (XANAX) 0.25 MG tablet Take 1 tablet (0.25 mg total) by mouth 2 (two) times daily as needed for anxiety.   ARIPiprazole (ABILIFY) 5 MG tablet Take 1 tablet (5 mg total) by mouth daily.   atorvastatin (LIPITOR) 10 MG tablet Take 1 tablet (10 mg total) by mouth daily.   calcium carbonate (OS-CAL) 600 MG TABS tablet Take 2 tablets (1,200 mg total) by mouth daily.   calcium carbonate (TUMS) 500 MG chewable tablet Chew 2 tablets (400 mg of elemental calcium total) by mouth every 6 (six) hours as needed for indigestion or heartburn.   Cholecalciferol (VITAMIN D3) 25 MCG (1000 UT) CAPS Take 1 capsule (1,000 Units total) by mouth daily.   Dextromethorphan-guaiFENesin 5-100 MG/5ML LIQD Take 10 mLs by mouth every 8 (eight) hours as needed.   donepezil (ARICEPT) 10 MG tablet Take 1 tablet (10 mg total) by mouth at bedtime.   FEROSUL 325 (65 Fe) MG tablet Take 325 mg by mouth daily.   ibuprofen (ADVIL) 400 MG tablet Take 1 tablet (400 mg total) by mouth every 8 (eight) hours as needed.   levothyroxine (SYNTHROID) 25 MCG tablet Take 1 tablet (25 mcg total) by mouth daily before breakfast.   loperamide (IMODIUM A-D) 2 MG tablet Take 1 tablet (2 mg total) by mouth every 12 (twelve) hours as needed for diarrhea or loose stools.   memantine  (NAMENDA) 10 MG tablet Take one tab two times a day   mirtazapine (REMERON) 30 MG tablet Take 1 tablet (30 mg total) by mouth at bedtime.   pseudoephedrine (SUDAFED) 30 MG tablet Take 1 tablet (30 mg total) by mouth every 4 (four) hours as needed for congestion.   No facility-administered encounter medications on file as of 05/27/2023.       11/22/2022   12:00 PM 05/23/2022    1:00 PM  MMSE - Mini Mental State Exam  Orientation to time 1 4  Orientation to Place 5 5  Registration 2 2  Attention/ Calculation 0 0  Recall 0 2  Language- name 2 objects 2 2  Language- repeat 0 1  Language- follow 3 step command 3 3  Language- read & follow direction 1 1  Write a sentence 1 1  Copy design 0 0  Total score 15 21       No data to display          Objective:     PHYSICAL EXAMINATION:    VITALS:  There were no vitals filed for this visit.  GEN:  The patient appears stated age and is in NAD. HEENT:  Normocephalic, atraumatic.  Lungs, significant rhonchi was audible, no wheezing  Neurological examination:  General: NAD, well-groomed, appears stated age. Orientation: The patient is alert. Oriented to person, not to place and date Cranial nerves: There is good facial symmetry.The speech is fluent and clear. No aphasia or dysarthria. Fund of knowledge is reduced. Recent and remote memory are impaired. Attention and concentration are reduced.  Able to name objects scratch that.  Hearing is decreased to conversational tone.   Sensation: Sensation is intact to light touch throughout Motor: Strength is at least antigravity x4. DTR's 2/4 in UE/LE     Movement examination: Tone: There is normal tone in the UE/LE Abnormal movements:  no tremor.  No myoclonus.  No asterixis.   Coordination:  There is R>L  decremation with RAM's.Abnormal finger to nose  Gait and Station: The patient has some  difficulty arising out of a deep-seated chair without the use of the hands. The patient's stride  length is short.  Gait is cautious and narrow.    Thank you for allowing [place] the opportunity to participate in the care of this nice patient. Please do not hesitate to contact us for any questions or concerns.   Total time spent on today's visit was 35 minutes dedicated to this patient today, preparing to see patient, examining the patient, ordering tests and/or medications and counseling the patient, documenting clinical information in the EHR or other health record, independently interpreting results and communicating results to the patient/family, discussing treatment and goals, answering patient's questions and coordinating care.  Cc:  Shirline Frees, NP  Marlowe Kays 05/28/2023 10:04 AM

## 2023-05-28 NOTE — Telephone Encounter (Signed)
FYI

## 2023-05-29 ENCOUNTER — Ambulatory Visit: Payer: Medicare PPO | Admitting: Adult Health

## 2023-06-04 DIAGNOSIS — M2010 Hallux valgus (acquired), unspecified foot: Secondary | ICD-10-CM | POA: Diagnosis not present

## 2023-06-04 DIAGNOSIS — L600 Ingrowing nail: Secondary | ICD-10-CM | POA: Diagnosis not present

## 2023-06-04 DIAGNOSIS — B351 Tinea unguium: Secondary | ICD-10-CM | POA: Diagnosis not present

## 2023-06-04 DIAGNOSIS — R234 Changes in skin texture: Secondary | ICD-10-CM | POA: Diagnosis not present

## 2023-06-04 DIAGNOSIS — M79672 Pain in left foot: Secondary | ICD-10-CM | POA: Diagnosis not present

## 2023-06-04 DIAGNOSIS — M79671 Pain in right foot: Secondary | ICD-10-CM | POA: Diagnosis not present

## 2023-06-04 DIAGNOSIS — I739 Peripheral vascular disease, unspecified: Secondary | ICD-10-CM | POA: Diagnosis not present

## 2023-06-04 DIAGNOSIS — I8393 Asymptomatic varicose veins of bilateral lower extremities: Secondary | ICD-10-CM | POA: Diagnosis not present

## 2023-06-04 DIAGNOSIS — M21629 Bunionette of unspecified foot: Secondary | ICD-10-CM | POA: Diagnosis not present

## 2023-07-10 ENCOUNTER — Encounter: Payer: Self-pay | Admitting: Adult Health

## 2023-07-10 NOTE — Telephone Encounter (Signed)
 Ok for VV

## 2023-07-11 ENCOUNTER — Other Ambulatory Visit: Payer: Self-pay | Admitting: Adult Health

## 2023-07-11 MED ORDER — ARIPIPRAZOLE 2 MG PO TABS: 2.0000 mg | ORAL_TABLET | Freq: Every day | ORAL | 1 refills | Status: DC

## 2023-08-04 ENCOUNTER — Ambulatory Visit: Payer: Medicare PPO

## 2023-08-04 VITALS — BP 120/62 | HR 96 | Temp 96.4°F | Ht 63.0 in | Wt 95.5 lb

## 2023-08-04 DIAGNOSIS — Z0000 Encounter for general adult medical examination without abnormal findings: Secondary | ICD-10-CM

## 2023-08-04 NOTE — Progress Notes (Signed)
 Subjective:   KANASIA GAYMAN is a 73 y.o. who presents for a Medicare Wellness preventive visit.  Visit Complete: In person  VideoDeclined- This patient declined Interactive audio and Acupuncturist. Therefore the visit was completed with audio only.  Persons Participating in Visit: Patient.  AWV Questionnaire: No: Patient Medicare AWV questionnaire was not completed prior to this visit.  Cardiac Risk Factors include: advanced age (>62men, >52 women);smoking/ tobacco exposure     Objective:    Today's Vitals   08/04/23 1337  BP: 120/62  Pulse: 96  Temp: (!) 96.4 F (35.8 C)  TempSrc: Oral  SpO2: 99%  Weight: 95 lb 8 oz (43.3 kg)  Height: 5\' 3"  (1.6 m)   Body mass index is 16.92 kg/m.     08/04/2023    2:05 PM 05/27/2023   11:30 AM 03/12/2023   12:58 PM 11/22/2022   11:32 AM 05/23/2022   12:49 PM 05/07/2022    1:31 PM 11/20/2021    7:50 AM  Advanced Directives  Does Patient Have a Medical Advance Directive? Yes Yes Yes Yes Yes Yes Yes  Type of Estate agent of [place];Living will Healthcare Power of Attorney Out of facility DNR (pink MOST or yellow form) Healthcare Power of [employer] Power of [place];Living will Healthcare Power of [place];Living will  Does patient want to make changes to medical advance directive?    No - Patient declined     Copy of Healthcare Power of Attorney in Chart? No - copy requested   No - copy requested  No - copy requested     Current Medications (verified) Outpatient Encounter Medications as of 08/04/2023  Medication Sig   acetaminophen (TYLENOL) 325 MG tablet Take 2 tablets (650 mg total) by mouth every 8 (eight) hours as needed.   ALPRAZolam (XANAX) 0.25 MG tablet Take 1 tablet (0.25 mg total) by mouth 2 (two) times daily as needed for anxiety.   ARIPiprazole (ABILIFY) 2 MG tablet Take 1 tablet (2 mg total) by mouth daily.   atorvastatin (LIPITOR) 10 MG tablet Take 1 tablet (10 mg total) by  mouth daily.   calcium carbonate (OS-CAL) 600 MG TABS tablet Take 2 tablets (1,200 mg total) by mouth daily.   calcium carbonate (TUMS) 500 MG chewable tablet Chew 2 tablets (400 mg of elemental calcium total) by mouth every 6 (six) hours as needed for indigestion or heartburn.   Cholecalciferol (VITAMIN D3) 25 MCG (1000 UT) CAPS Take 1 capsule (1,000 Units total) by mouth daily.   Dextromethorphan-guaiFENesin 5-100 MG/5ML LIQD Take 10 mLs by mouth every 8 (eight) hours as needed.   donepezil (ARICEPT) 10 MG tablet Take 1 tablet (10 mg total) by mouth at bedtime.   FEROSUL 325 (65 Fe) MG tablet Take 325 mg by mouth daily.   ibuprofen (ADVIL) 400 MG tablet Take 1 tablet (400 mg total) by mouth every 8 (eight) hours as needed.   levothyroxine (SYNTHROID) 25 MCG tablet Take 1 tablet (25 mcg total) by mouth daily before breakfast.   loperamide (IMODIUM A-D) 2 MG tablet Take 1 tablet (2 mg total) by mouth every 12 (twelve) hours as needed for diarrhea or loose stools.   memantine (NAMENDA) 10 MG tablet Take one tab two times a day   mirtazapine (REMERON) 30 MG tablet Take 1 tablet (30 mg total) by mouth at bedtime.   pseudoephedrine (SUDAFED) 30 MG tablet Take 1 tablet (30 mg total) by mouth every 4 (four) hours as needed for  congestion.   No facility-administered encounter medications on file as of 08/04/2023.    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 Dr. Christell Constant   Aortic atherosclerosis    Chicken pox    Dementia likely due to Alzheimer's disease 11/06/2021   History of antineoplastic chemotherapy 07/24/2015   Hyperlipidemia    Microscopic hematuria 10/17/2014   Osteopenia    now OSTEOPOROSIS, dexa 2017- R Femur -2.2 was on Actonel prior  so FRAX not appropriate but it was reported at major 10.9% and hip 3.1%- dexa ordered 2019, never had done, but HPI reported pt had dexa 2019 at last annual so not ordered. Will repeat dexa this year and Rx risidronate in the mean time. Asked pt to find out from PCP if they had prescribed Actonel prior so we can determine how many year   Personal history of colonic polyps 09/27/2016   3 polyps removed, repeated 02/2020, due 3 yrs   Thyroid dysfunction 05/11/2019   dx 02/2019, started on Euthyrox   Tobacco use 01/06/2015   Vitamin D deficiency    History reviewed. No pertinent surgical history. Family History  Problem Relation Age of Onset   Congestive Heart Failure Mother    Dementia Mother        late in life   Heart attack Father    Heart disease Father    Social History   Socioeconomic History   Marital status: Married    Spouse name: Not on file   Number of children: Not on file   Years of education: 13   Highest education level: Some college, no degree  Occupational History   Occupation: Retired    Comment: Sales executive  Tobacco Use   Smoking status: Former    Current packs/day: 0.00    Types: Cigarettes    Quit date: 03/04/2023    Years since quitting: 0.4   Smokeless tobacco: Never   Tobacco comments:    Is actively trying to cut down number of cigarettes, but not ready to set a quit date. 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    -Married for 30 years    - Has step children, all live in [place]   - No pets   - Plays games on the computer and enjoys yard work.    Two story home   Caffeine a lot of pepsi   Social Drivers of Health   Financial Resource Strain: Low Risk  (08/04/2023)   Overall  Financial Resource Strain (CARDIA)    Difficulty of Paying Living Expenses: Not hard at all  Food Insecurity: No Food Insecurity (08/04/2023)   Hunger Vital Sign    Worried About Running Out of Food in the Last Year: Never true    Ran Out of Food in the Last Year: Never true  Transportation Needs: No Transportation Needs (08/04/2023)   PRAPARE - Administrator, Civil Service (  Medical): No    Lack of Transportation (Non-Medical): No  Physical Activity: Inactive (08/04/2023)   Exercise Vital Sign    Days of Exercise per Week: 0 days    Minutes of Exercise per Session: 0 min  Stress: No Stress Concern Present (08/04/2023)   [employer] of Occupational Health - Occupational Stress Questionnaire    Feeling of Stress : Not at all  Social Connections: Moderately Integrated (08/04/2023)   Social Connection and Isolation Panel [NHANES]    Frequency of Communication with Friends and Family: More than three times a week    Frequency of Social Gatherings with Friends and Family: More than three times a week    Attends Religious Services: More than 4 times per year    Active Member of [employer] or Organizations: Yes    Attends Banker Meetings: More than 4 times per year    Marital Status: Widowed    Tobacco Counseling Counseling given: Yes Tobacco comments: Is actively trying to cut down number of cigarettes, but not ready to set a quit date. Smokes a pack a day    Clinical Intake:  Pre-visit preparation completed: Yes  Pain : No/denies pain     BMI - recorded: 16.92 Nutritional Status: BMI <19  Underweight Nutritional Risks: None Diabetes: No  Lab Results  Component Value Date   HGBA1C 5.9 04/04/2022   HGBA1C 5.5 02/10/2020   HGBA1C 5.9 01/06/2015     How often do you need to have someone help you when you read instructions, pamphlets, or other written materials from your doctor or pharmacy?: 1 - Never  Interpreter Needed?: No  Information entered by ::  Theresa Mulligan LPN   Activities of Daily Living     08/04/2023    2:00 PM  In your present state of health, do you have any difficulty performing the following activities:  Hearing? 1  Comment Wears Hearing Aids  Vision? 0  Difficulty concentrating or making decisions? 1  Comment Dx: Dementia  Walking or climbing stairs? 0  Dressing or bathing? 1  Comment Aide assist  Doing errands, shopping? 1  Comment Family Ship broker and eating ? Y  Comment Facility prepares meals  Using the Toilet? N  In the past six months, have you accidently leaked urine? N  Do you have problems with loss of bowel control? N  Managing your Medications? Y  Comment Aide assist  Managing your Finances? Y  Comment Family assist  Housekeeping or managing your Housekeeping? Y  Comment Aide assist    Patient Care Team: Shirline Frees, NP as PCP - General (Family Medicine) Bjorn Pippin, MD as Attending Physician (Urology) Elta Guadeloupe, MD as Referring Physician (Internal Medicine) Preminger, Shawnie Dapper, MD as Referring Physician (Urology) Philip Aspen, DO as Consulting Physician (Obstetrics and Gynecology)  Indicate any recent Medical Services you may have received from other than [facility] providers in the past year (date may be approximate).     Assessment:   This is a routine wellness examination for Marlayna.  Hearing/Vision screen Hearing Screening - Comments:: Wears Hearing Aids Vision Screening - Comments:: Wears rx glasses - up to date with routine eye exams with  Dr Emily Filbert   Goals Addressed               This Visit's Progress     No current goals (pt-stated)         Depression Screen      08/04/2023    1:39 PM  12/31/2022   11:50 AM 05/07/2022    1:29 PM 05/01/2021   10:03 AM 04/18/2020    9:44 AM 02/10/2020    8:03 AM 10/02/2016    1:52 PM  PHQ 2/9 Scores  PHQ - 2 Score 0 0 0 0 0 0 0    Fall Risk     08/04/2023    2:04 PM 05/27/2023   11:30 AM 11/22/2022   11:32 AM  05/23/2022   12:49 PM 05/07/2022    1:30 PM  Fall Risk   Falls in the past year? 1 1 0 0 0  Number falls in past yr: 0 0 0 0 0  Injury with Fall? 0 0 0 0 0  Risk for fall due to : No Fall Risks    No Fall Risks  Follow up Falls prevention discussed;Falls evaluation completed Falls evaluation completed Falls evaluation completed Falls evaluation completed Falls prevention discussed    MEDICARE RISK AT HOME:  Medicare Risk at Home Any stairs in or around the home?: Yes If so, are there any without handrails?: No Home free of loose throw rugs in walkways, pet beds, electrical cords, etc?: Yes Adequate lighting in your home to reduce risk of falls?: Yes Life alert?: Yes Use of a cane, walker or w/c?: No Grab bars in the bathroom?: Yes Shower chair or bench in shower?: Yes Elevated toilet seat or a handicapped toilet?: Yes  TIMED UP AND GO:  Was the test performed?  Yes  Length of time to ambulate 10 feet: 10 sec Gait slow and steady without use of assistive device  Cognitive Function: Impaired: Cognitive status assessed by direct observation. Patient is unable to complete screening 6CIT or other cognitive screening test.      11/22/2022   12:00 PM 05/23/2022    1:00 PM  MMSE - Mini Mental State Exam  Orientation to time 1 4  Orientation to Place 5 5  Registration 2 2  Attention/ Calculation 0 0  Recall 0 2  Language- name 2 objects 2 2  Language- repeat 0 1  Language- follow 3 step command 3 3  Language- read & follow direction 1 1  Write a sentence 1 1  Copy design 0 0  Total score 15 21        08/04/2023    2:06 PM 05/07/2022    1:32 PM 05/01/2021   10:06 AM 04/18/2020    9:53 AM  6CIT Screen  What Year? -- 0 points 0 points 0 points  What month? -- 0 points 0 points 0 points  What time? -- 0 points 0 points   Count back from 20 -- 0 points 0 points 0 points  Months in reverse -- 2 points 2 points 4 points  Repeat phrase -- 8 points 6 points 2 points  Total Score  10  points 8 points     Immunizations Immunization History  Administered Date(s) Administered   Fluad Quad(high Dose 65+) 01/19/2019, 01/13/2020, 02/13/2021, 01/23/2022   Fluad Trivalent(High Dose 65+) 01/28/2023   Hep B, Unspecified 06/27/2008   Hepatitis B 06/27/2008   Influenza Inj Mdck Quad With Preservative 01/27/2018, 02/28/2019   Influenza, High Dose Seasonal PF 02/10/2015, 03/20/2016, 01/21/2017, 02/09/2018   Influenza-Unspecified 02/28/2016, 12/28/2016   PFIZER(Purple Top)SARS-COV-2 Vaccination 06/05/2019, 06/26/2019, 01/26/2020   PNEUMOCOCCAL CONJUGATE-20 02/18/2023   PPD Test 02/18/2023   Pneumococcal Conjugate-13 02/10/2015   Pneumococcal Polysaccharide-23 03/20/2016   Pneumococcal-Unspecified 02/28/2016   Tdap 09/20/2014   Zoster, Live 01/03/2015  Screening Tests Health Maintenance  Topic Date Due   Zoster Vaccines- Shingrix (1 of 2) 12/28/1969   MAMMOGRAM  05/10/2021   COVID-19 Vaccine (4 - 2024-25 season) 12/29/2022   INFLUENZA VACCINE  11/28/2023   Medicare Annual Wellness (AWV)  08/03/2024   DTaP/Tdap/Td (2 - Td or Tdap) 09/19/2024   Colonoscopy  10/26/2026   Pneumonia Vaccine 21+ Years old  Completed   DEXA SCAN  Completed   Hepatitis C Screening  Completed   HPV VACCINES  Aged Out   Lung Cancer Screening  Discontinued    Health Maintenance  Health Maintenance Due  Topic Date Due   Zoster Vaccines- Shingrix (1 of 2) 12[birth year]28/1969   MAMMOGRAM  05/10/2021   COVID-19 Vaccine (4 - 2024-25 season) 12/29/2022   Health Maintenance Items Addressed: Patient declined Mammogram  Additional Screening:  Vision Screening: Recommended annual ophthalmology exams for early detection of glaucoma and other disorders of the eye.  Dental Screening: Recommended annual dental exams for proper oral hygiene  Community Resource Referral / Chronic Care Management: CRR required this visit?  No   CCM required this visit?  No     Plan:     I have personally reviewed  and noted the following in the patient's chart:   Medical and social history Use of alcohol, tobacco or illicit drugs  Current medications and supplements including opioid prescriptions. Patient is not currently taking opioid prescriptions. Functional ability and status Nutritional status Physical activity Advanced directives List of other physicians Hospitalizations, surgeries, and ER visits in previous 12 months Vitals Screenings to include cognitive, depression, and falls Referrals and appointments  In addition, I have reviewed and discussed with patient certain preventive protocols, quality metrics, and best practice recommendations. A written personalized care plan for preventive services as well as general preventive health recommendations were provided to patient.     [name], LPN   05/04/1094   After Visit Summary: (In Person-Printed) AVS printed and given to the patient  Notes: Nothing significant to report at this time.

## 2023-08-04 NOTE — Patient Instructions (Addendum)
 Emma Schneider , Thank you for taking time to come for your Medicare Wellness Visit. I appreciate your ongoing commitment to your health goals. Please review the following plan we discussed and let me know if I can assist you in the future.   Referrals/Orders/Follow-Ups/Clinician Recommendations:   This is a list of the screening recommended for you and due dates:  Health Maintenance  Topic Date Due   Zoster (Shingles) Vaccine (1 of 2) 12/28/1969   Mammogram  05/10/2021   COVID-19 Vaccine (4 - 2024-25 season) 12/29/2022   Flu Shot  11/28/2023   Medicare Annual Wellness Visit  08/03/2024   DTaP/Tdap/Td vaccine (2 - Td or Tdap) 09/19/2024   Colon Cancer Screening  10/26/2026   Pneumonia Vaccine  Completed   DEXA scan (bone density measurement)  Completed   Hepatitis C Screening  Completed   HPV Vaccine  Aged Out   Screening for Lung Cancer  Discontinued    Advanced directives: (Copy Requested) Please bring a copy of your health care power of attorney and living will to the office to be added to your chart at your convenience. You can mail to [facility] [address] [place], [place] [address] or email to [email or web address]  Next Medicare Annual Wellness Visit scheduled for next year: Yes

## 2023-08-21 NOTE — Progress Notes (Signed)
 Copy of visit notes created in order to addend video smart set as per billing query. Please addend copy Subjective:   Emma Schneider is a 73 y.o. who presents for a Medicare Wellness preventive visit.  Visit Complete: In person  Persons Participating in Visit: Patient.  AWV Questionnaire: No: Patient Medicare AWV questionnaire was not completed prior to this visit.  Cardiac Risk Factors include: advanced age (>25men, >51 women);smoking/ tobacco exposure     Objective:    Today's Vitals   08/04/23 1337  BP: 120/62  Pulse: 96  Temp: (!) 96.4 F (35.8 C)  TempSrc: Oral  SpO2: 99%  Weight: 95 lb 8 oz (43.3 kg)  Height: 5\' 3"  (1.6 m)   Body mass index is 16.92 kg/m.     08/04/2023    2:05 PM 05/27/2023   11:30 AM 03/12/2023   12:58 PM 11/22/2022   11:32 AM 05/23/2022   12:49 PM 05/07/2022    1:31 PM 11/20/2021    7:50 AM  Advanced Directives  Does Patient Have a Medical Advance Directive? Yes Yes Yes Yes Yes Yes Yes  Type of Estate agent of [place];Living will Healthcare Power of Attorney Out of facility DNR (pink MOST or yellow form) Healthcare Power of [employer] Power of [place];Living will Healthcare Power of [place];Living will  Does patient want to make changes to medical advance directive?    No - Patient declined     Copy of Healthcare Power of Attorney in Chart? No - copy requested   No - copy requested  No - copy requested     Current Medications (verified) Outpatient Encounter Medications as of 08/04/2023  Medication Sig   acetaminophen  (TYLENOL ) 325 MG tablet Take 2 tablets (650 mg total) by mouth every 8 (eight) hours as needed.   ALPRAZolam  (XANAX ) 0.25 MG tablet Take 1 tablet (0.25 mg total) by mouth 2 (two) times daily as needed for anxiety.   ARIPiprazole  (ABILIFY ) 2 MG tablet Take 1 tablet (2 mg total) by mouth daily.   atorvastatin  (LIPITOR) 10 MG tablet Take 1 tablet (10 mg total) by mouth daily.   calcium  carbonate  (OS-CAL) 600 MG TABS tablet Take 2 tablets (1,200 mg total) by mouth daily.   calcium  carbonate (TUMS) 500 MG chewable tablet Chew 2 tablets (400 mg of elemental calcium  total) by mouth every 6 (six) hours as needed for indigestion or heartburn.   Cholecalciferol  (VITAMIN D3) 25 MCG (1000 UT) CAPS Take 1 capsule (1,000 Units total) by mouth daily.   Dextromethorphan -guaiFENesin  5-100 MG/5ML LIQD Take 10 mLs by mouth every 8 (eight) hours as needed.   donepezil  (ARICEPT ) 10 MG tablet Take 1 tablet (10 mg total) by mouth at bedtime.   FEROSUL 325 (65 Fe) MG tablet Take 325 mg by mouth daily.   ibuprofen  (ADVIL ) 400 MG tablet Take 1 tablet (400 mg total) by mouth every 8 (eight) hours as needed.   levothyroxine  (SYNTHROID ) 25 MCG tablet Take 1 tablet (25 mcg total) by mouth daily before breakfast.   loperamide  (IMODIUM  A-D) 2 MG tablet Take 1 tablet (2 mg total) by mouth every 12 (twelve) hours as needed for diarrhea or loose stools.   memantine  (NAMENDA ) 10 MG tablet Take one tab two times a day   mirtazapine  (REMERON ) 30 MG tablet Take 1 tablet (30 mg total) by mouth at bedtime.   pseudoephedrine  (SUDAFED) 30 MG tablet Take 1 tablet (30 mg total) by mouth every 4 (four) hours as needed for  congestion.   No facility-administered encounter medications on file as of 08/04/2023.    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 Dr. Sulema Endo   Aortic atherosclerosis    Chicken pox    Dementia likely due to Alzheimer's disease 11/06/2021   History of antineoplastic chemotherapy 07/24/2015   Hyperlipidemia    Microscopic hematuria 10/17/2014   Osteopenia    now OSTEOPOROSIS, dexa 2017- R Femur -2.2 was on Actonel prior so FRAX not appropriate but it  was reported at major 10.9% and hip 3.1%- dexa ordered 2019, never had done, but HPI reported pt had dexa 2019 at last annual so not ordered. Will repeat dexa this year and Rx risidronate in the mean time. Asked pt to find out from PCP if they had prescribed Actonel prior so we can determine how many year   Personal history of colonic polyps 09/27/2016   3 polyps removed, repeated 02/2020, due 3 yrs   Thyroid  dysfunction 05/11/2019   dx 02/2019, started on Euthyrox    Tobacco use 01/06/2015   Vitamin D  deficiency    History reviewed. No pertinent surgical history. Family History  Problem Relation Age of Onset   Congestive Heart Failure Mother    Dementia Mother        late in life   Heart attack Father    Heart disease Father    Social History   Socioeconomic History   Marital status: Married    Spouse name: Not on file   Number of children: Not on file   Years of education: 13   Highest education level: Some college, no degree  Occupational History   Occupation: Retired    Comment: Sales executive  Tobacco Use   Smoking status: Former    Current packs/day: 0.00    Types: Cigarettes    Quit date: 03/04/2023    Years since quitting: 0.4   Smokeless tobacco: Never   Tobacco comments:    Is actively trying to cut down number of cigarettes, but not ready to set a quit date. 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    -Married for 30 years    - Has step children, all live in [place]   - No pets   - Plays games on the computer and enjoys yard work.    Two story home   Caffeine a lot of pepsi   Social Drivers of Health   Financial Resource Strain: Low Risk  (08/04/2023)   Overall Financial Resource Strain  (CARDIA)    Difficulty of Paying Living Expenses: Not hard at all  Food Insecurity: No Food Insecurity (08/04/2023)   Hunger Vital Sign    Worried About Running Out of Food in the Last Year: Never true    Ran Out of Food in the Last Year: Never true  Transportation Needs: No Transportation Needs (08/04/2023)   PRAPARE - Administrator, Civil Service (  Medical): No    Lack of Transportation (Non-Medical): No  Physical Activity: Inactive (08/04/2023)   Exercise Vital Sign    Days of Exercise per Week: 0 days    Minutes of Exercise per Session: 0 min  Stress: No Stress Concern Present (08/04/2023)   [employer] of Occupational Health - Occupational Stress Questionnaire    Feeling of Stress : Not at all  Social Connections: Moderately Integrated (08/04/2023)   Social Connection and Isolation Panel [NHANES]    Frequency of Communication with Friends and Family: More than three times a week    Frequency of Social Gatherings with Friends and Family: More than three times a week    Attends Religious Services: More than 4 times per year    Active Member of [employer] or Organizations: Yes    Attends Banker Meetings: More than 4 times per year    Marital Status: Widowed    Tobacco Counseling Counseling given: Yes Tobacco comments: Is actively trying to cut down number of cigarettes, but not ready to set a quit date. Smokes a pack a day    Clinical Intake:  Pre-visit preparation completed: Yes  Pain : No/denies pain     BMI - recorded: 16.92 Nutritional Status: BMI <19  Underweight Nutritional Risks: None Diabetes: No  Lab Results  Component Value Date   HGBA1C 5.9 04/04/2022   HGBA1C 5.5 02/10/2020   HGBA1C 5.9 01/06/2015     How often do you need to have someone help you when you read instructions, pamphlets, or other written materials from your doctor or pharmacy?: 1 - Never  Interpreter Needed?: No  Information entered by :: Farris Hong  LPN   Activities of Daily Living     08/04/2023    2:00 PM  In your present state of health, do you have any difficulty performing the following activities:  Hearing? 1  Comment Wears Hearing Aids  Vision? 0  Difficulty concentrating or making decisions? 1  Comment Dx: Dementia  Walking or climbing stairs? 0  Dressing or bathing? 1  Comment Aide assist  Doing errands, shopping? 1  Comment Family Ship broker and eating ? Y  Comment Facility prepares meals  Using the Toilet? N  In the past six months, have you accidently leaked urine? N  Do you have problems with loss of bowel control? N  Managing your Medications? Y  Comment Aide assist  Managing your Finances? Y  Comment Family assist  Housekeeping or managing your Housekeeping? Y  Comment Aide assist    Patient Care Team: Alto Atta, NP as PCP - General (Family Medicine) Homero Luster, MD as Attending Physician (Urology) Jeanell Mikes, MD as Referring Physician (Internal Medicine) Preminger, Obed Bellows, MD as Referring Physician (Urology) Atlas Blank, DO as Consulting Physician (Obstetrics and Gynecology)  Indicate any recent Medical Services you may have received from other than [facility] providers in the past year (date may be approximate).     Assessment:   This is a routine wellness examination for [place].  Hearing/Vision screen Hearing Screening - Comments:: Wears Hearing Aids Vision Screening - Comments:: Wears rx glasses - up to date with routine eye exams with  Dr Joanne Muckle   Goals Addressed               This Visit's Progress     No current goals (pt-stated)         Depression Screen      08/04/2023    1:39 PM  12/31/2022   11:50 AM 05/07/2022    1:29 PM 05/01/2021   10:03 AM 04/18/2020    9:44 AM 02/10/2020    8:03 AM 10/02/2016    1:52 PM  PHQ 2/9 Scores  PHQ - 2 Score 0 0 0 0 0 0 0    Fall Risk     08/04/2023    2:04 PM 05/27/2023   11:30 AM 11/22/2022   11:32 AM 05/23/2022   12:49 PM  05/07/2022    1:30 PM  Fall Risk   Falls in the past year? 1 1 0 0 0  Number falls in past yr: 0 0 0 0 0  Injury with Fall? 0 0 0 0 0  Risk for fall due to : No Fall Risks    No Fall Risks  Follow up Falls prevention discussed;Falls evaluation completed Falls evaluation completed Falls evaluation completed Falls evaluation completed Falls prevention discussed    MEDICARE RISK AT HOME:  Medicare Risk at Home Any stairs in or around the home?: Yes If so, are there any without handrails?: No Home free of loose throw rugs in walkways, pet beds, electrical cords, etc?: Yes Adequate lighting in your home to reduce risk of falls?: Yes Life alert?: Yes Use of a cane, walker or w/c?: No Grab bars in the bathroom?: Yes Shower chair or bench in shower?: Yes Elevated toilet seat or a handicapped toilet?: Yes  TIMED UP AND GO:  Was the test performed?  Yes  Length of time to ambulate 10 feet: 10 sec Gait slow and steady without use of assistive device  Cognitive Function: Impaired: Cognitive status assessed by direct observation. Patient is unable to complete screening 6CIT or other cognitive screening test.      11/22/2022   12:00 PM 05/23/2022    1:00 PM  MMSE - Mini Mental State Exam  Orientation to time 1 4  Orientation to Place 5 5  Registration 2 2  Attention/ Calculation 0 0  Recall 0 2  Language- name 2 objects 2 2  Language- repeat 0 1  Language- follow 3 step command 3 3  Language- read & follow direction 1 1  Write a sentence 1 1  Copy design 0 0  Total score 15 21        08/04/2023    2:06 PM 05/07/2022    1:32 PM 05/01/2021   10:06 AM 04/18/2020    9:53 AM  6CIT Screen  What Year? -- 0 points 0 points 0 points  What month? -- 0 points 0 points 0 points  What time? -- 0 points 0 points   Count back from 20 -- 0 points 0 points 0 points  Months in reverse -- 2 points 2 points 4 points  Repeat phrase -- 8 points 6 points 2 points  Total Score  10 points 8 points      Immunizations Immunization History  Administered Date(s) Administered   Fluad Quad(high Dose 65+) 01/19/2019, 01/13/2020, 02/13/2021, 01/23/2022   Fluad Trivalent(High Dose 65+) 01/28/2023   Hep B, Unspecified 06/27/2008   Hepatitis B 06/27/2008   Influenza Inj Mdck Quad With Preservative 01/27/2018, 02/28/2019   Influenza, High Dose Seasonal PF 02/10/2015, 03/20/2016, 01/21/2017, 02/09/2018   Influenza-Unspecified 02/28/2016, 12/28/2016   PFIZER(Purple Top)SARS-COV-2 Vaccination 06/05/2019, 06/26/2019, 01/26/2020   PNEUMOCOCCAL CONJUGATE-20 02/18/2023   PPD Test 02/18/2023   Pneumococcal Conjugate-13 02/10/2015   Pneumococcal Polysaccharide-23 03/20/2016   Pneumococcal-Unspecified 02/28/2016   Tdap 09/20/2014   Zoster, Live 01/03/2015  Screening Tests Health Maintenance  Topic Date Due   Zoster Vaccines- Shingrix (1 of 2) 12/28/1969   MAMMOGRAM  05/10/2021   COVID-19 Vaccine (4 - 2024-25 season) 12/29/2022   INFLUENZA VACCINE  11/28/2023   Medicare Annual Wellness (AWV)  08/03/2024   DTaP/Tdap/Td (2 - Td or Tdap) 09/19/2024   Colonoscopy  10/26/2026   Pneumonia Vaccine 21+ Years old  Completed   DEXA SCAN  Completed   Hepatitis C Screening  Completed   HPV VACCINES  Aged Out   Meningococcal B Vaccine  Aged Out   Lung Cancer Screening  Discontinued    Health Maintenance  Health Maintenance Due  Topic Date Due   Zoster Vaccines- Shingrix (1 of 2) 12[birth year]28/1969   MAMMOGRAM  05/10/2021   COVID-19 Vaccine (4 - 2024-25 season) 12/29/2022   Health Maintenance Items Addressed: Patient declined Mammogram  Additional Screening:  Vision Screening: Recommended annual ophthalmology exams for early detection of glaucoma and other disorders of the eye.  Dental Screening: Recommended annual dental exams for proper oral hygiene  Community Resource Referral / Chronic Care Management: CRR required this visit?  No   CCM required this visit?  No     Plan:     I have  personally reviewed and noted the following in the patient's chart:   Medical and social history Use of alcohol, tobacco or illicit drugs  Current medications and supplements including opioid prescriptions. Patient is not currently taking opioid prescriptions. Functional ability and status Nutritional status Physical activity Advanced directives List of other physicians Hospitalizations, surgeries, and ER visits in previous 12 months Vitals Screenings to include cognitive, depression, and falls Referrals and appointments  In addition, I have reviewed and discussed with patient certain preventive protocols, quality metrics, and best practice recommendations. A written personalized care plan for preventive services as well as general preventive health recommendations were provided to patient.     Dewayne Ford, LPN   12/26/5619   After Visit Summary: (In Person-Printed) AVS printed and given to the patient  Notes: Nothing significant to report at this time.

## 2023-09-19 ENCOUNTER — Telehealth: Payer: Self-pay | Admitting: Adult Health

## 2023-09-19 NOTE — Telephone Encounter (Signed)
 Emma Schneider from [facility] was calling to check on the status of a Dementia form that was sent Friday and today.   Call Back #[phone]

## 2023-09-19 NOTE — Telephone Encounter (Signed)
 Called back no answer. PPW was printed off to fax. However, last pages was faded due to ink running out. Tried to call so that a new one can be faxed but no answer.

## 2023-10-07 ENCOUNTER — Ambulatory Visit: Payer: Medicare PPO | Admitting: Adult Health

## 2023-10-07 VITALS — BP 100/60 | HR 49 | Temp 97.7°F | Ht 63.0 in | Wt 104.0 lb

## 2023-10-07 DIAGNOSIS — G309 Alzheimer's disease, unspecified: Secondary | ICD-10-CM | POA: Diagnosis not present

## 2023-10-07 DIAGNOSIS — F0280 Dementia in other diseases classified elsewhere without behavioral disturbance: Secondary | ICD-10-CM

## 2023-10-07 DIAGNOSIS — F32A Depression, unspecified: Secondary | ICD-10-CM | POA: Diagnosis not present

## 2023-10-07 DIAGNOSIS — F419 Anxiety disorder, unspecified: Secondary | ICD-10-CM | POA: Diagnosis not present

## 2023-10-07 DIAGNOSIS — R451 Restlessness and agitation: Secondary | ICD-10-CM | POA: Diagnosis not present

## 2023-10-07 NOTE — Patient Instructions (Addendum)
 It was great seeing you today   Please let me know if you need anything   Follow up for your annual exam after December 10th.

## 2023-10-07 NOTE — Progress Notes (Signed)
 Subjective:    Patient ID: Emma Schneider, female    DOB: 03-17-1951, 73 y.o.   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  She presents to the office today with her daughter for follow up.   Alzheimer's dementia- Managed by neurology.   MRI of the brain in January 2023 was remarkable for generalized atrophy, negative for hydrocephalus, and with mild to moderate microvascular ischemic changes.  Neuropsychiatry evaluation with Dr. Niki Barter is concerning for dementia due to Alzheimer's disease.  She is currently managed with Aricept  10 mg daily and Namenda  10 mg BID.  She has had some worsening cognitive decline and needs more assistance with her ADLs.  She is having difficulty remembering recent conversations and names of people she just met, this leads her to avoiding conversations due to awareness.  Continues to live at [facility] but will be moving to Memory Care unit in the next few weeks.    She was started on Abilify  5 mg back in December 2024 for agitation related to dementia.  In March 2025 her dose was decreased to 2 mg as patient was very sedated and reluctant to eat.  Her daughter reports today that since the decrease she has noticed some significant improvement overall, she is awake more and is eating but that she continues to have a flat affect.  Her daughter is wondering if this is coming from Abilify  versus her dementia.  She does have as needed Xanax  0.25 mg for anxiety and behavioral  disturbances but has not had to use these since going on Abilify .   Review of Systems See HPI   Past Medical History:  Diagnosis Date   Adenoma of right adrenal gland 12/16/2014   Adrenal incidentaloma (HCC)    AML (acute myeloid leukemia) in remission 09/28/1998   completed tx 01/1999 - remission since, sees heme[facility]onc at [facility] annually. Dr. 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 Will repeat dexa this year and Rx risidronate in the mean time. Asked pt to find out from PCP if they had prescribed Actonel prior so we can determine how many year   Personal history of colonic polyps 09/27/2016   3 polyps removed, repeated 02/2020, due 3 yrs   Thyroid  dysfunction 05/11/2019   dx 02/2019, started on Euthyrox    Tobacco use 01/06/2015   Vitamin D  deficiency     Social History   Socioeconomic History   Marital status: Married    Spouse name: Not on file   Number of children: Not on file   Years of education: 13   Highest education level: Some college, no degree  Occupational History   Occupation: Retired    Comment:  Dental assistant  Tobacco Use   Smoking status: Former    Current packs/day: 0.00    Types: Cigarettes    Quit date: 03/04/2023    Years since quitting: 0.5   Smokeless tobacco: Never   Tobacco comments:    Is actively trying to cut down number of cigarettes, but not ready to set a quit date. 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    -Married for 30 years    - Has step children, all live in [place]   - No pets   - Plays games on the computer and enjoys yard work.    Two story home   Caffeine a lot of pepsi   Social Drivers of Health   Financial Resource Strain: Low Risk  (08/04/2023)   Overall Financial Resource Strain (CARDIA)    Difficulty of Paying Living Expenses: Not hard at all  Food Insecurity: No Food Insecurity (08/04/2023)   Hunger Vital Sign    Worried About Running Out of Food in the Last Year: Never true    Ran Out of Food in the Last Year: Never true  Transportation Needs: No Transportation Needs (08/04/2023)   PRAPARE - Administrator, Civil Service (Medical): No    Lack of Transportation (Non-Medical): No  Physical Activity: Inactive (08/04/2023)   Exercise Vital Sign    Days of Exercise per Week: 0 days    Minutes of Exercise per Session: 0 min  Stress: No Stress Concern Present (08/04/2023)   [employer] of Occupational Health - Occupational Stress Questionnaire    Feeling of Stress : Not at all  Social Connections: Moderately Integrated (08/04/2023)   Social Connection and Isolation Panel [NHANES]    Frequency of Communication with Friends and Family: More than three times a week    Frequency of Social Gatherings with Friends and Family: More than three times a week    Attends Religious Services: More than 4 times per year    Active Member of [employer] or Organizations: Yes    Attends Banker Meetings: More than 4 times per year    Marital Status: Widowed  Intimate Partner Violence: Not At Risk (08/04/2023)   Humiliation, Afraid, Rape, and Kick questionnaire    Fear of Current or Ex-Partner: No    Emotionally Abused: No    Physically Abused: No    Sexually Abused: No    No past surgical history on file.  Family History   Problem Relation Age of Onset   Congestive Heart Failure Mother    Dementia Mother        late in life   Heart attack Father    Heart disease Father     Allergies  Allergen Reactions   Wellbutrin  [Bupropion ] Other (See Comments)    Hallucinations     Current Outpatient Medications on File Prior to Visit  Medication Sig Dispense Refill   acetaminophen  (TYLENOL ) 325 MG tablet Take 2 tablets (650 mg total) by mouth every 8 (eight) hours as needed. 30 tablet 0   ALPRAZolam  (XANAX ) 0.25 MG tablet Take 1 tablet (0.25 mg total) by mouth 2 (two) times  daily as needed for anxiety. 30 tablet 3   ARIPiprazole  (ABILIFY ) 2 MG tablet Take 1 tablet (2 mg total) by mouth daily. 90 tablet 1   atorvastatin  (LIPITOR) 10 MG tablet Take 1 tablet (10 mg total) by mouth daily. 90 tablet 3   calcium  carbonate (OS-CAL) 600 MG TABS tablet Take 2 tablets (1,200 mg total) by mouth daily. 90 tablet 3   calcium  carbonate (TUMS) 500 MG chewable tablet Chew 2 tablets (400 mg of elemental calcium  total) by mouth every 6 (six) hours as needed for indigestion or heartburn. 60 tablet 0   Cholecalciferol  (VITAMIN D3) 25 MCG (1000 UT) CAPS Take 1 capsule (1,000 Units total) by mouth daily. 90 capsule 3   Dextromethorphan -guaiFENesin  5-100 MG/5ML LIQD Take 10 mLs by mouth every 8 (eight) hours as needed. 10 mL 0   donepezil  (ARICEPT ) 10 MG tablet Take 1 tablet (10 mg total) by mouth at bedtime. 90 tablet 4   FEROSUL 325 (65 Fe) MG tablet Take 325 mg by mouth daily.     ibuprofen  (ADVIL ) 400 MG tablet Take 1 tablet (400 mg total) by mouth every 8 (eight) hours as needed. 30 tablet 0   levothyroxine  (SYNTHROID ) 25 MCG tablet Take 1 tablet (25 mcg total) by mouth daily before breakfast. 90 tablet 3   loperamide  (IMODIUM  A-D) 2 MG tablet Take 1 tablet (2 mg total) by mouth every 12 (twelve) hours as needed for diarrhea or loose stools. 30 tablet 0   memantine  (NAMENDA ) 10 MG tablet Take one tab two times a day 60 tablet 11    mirtazapine  (REMERON ) 30 MG tablet Take 1 tablet (30 mg total) by mouth at bedtime. 90 tablet 1   pseudoephedrine  (SUDAFED) 30 MG tablet Take 1 tablet (30 mg total) by mouth every 4 (four) hours as needed for congestion. 30 tablet 0   No current facility-administered medications on file prior to visit.    BP 100/60   Pulse (!) 49   Temp 97.7 F (36.5 C) (Oral)   Ht 5\' 3"  (1.6 m)   Wt 104 lb (47.2 kg)   SpO2 98%   BMI 18.42 kg/m       Objective:   Physical Exam Vitals and nursing note reviewed.  Constitutional:      Appearance: Normal appearance.  Cardiovascular:     Rate and Rhythm: Normal rate and regular rhythm.     Pulses: Normal pulses.     Heart sounds: Normal heart sounds.  Pulmonary:     Effort: Pulmonary effort is normal.     Breath sounds: Normal breath sounds.  Musculoskeletal:        General: Normal range of motion.  Skin:    General: Skin is warm and dry.  Neurological:     Mental Status: She is alert.  Psychiatric:        Attention and Perception: She is inattentive.        Mood and Affect: Affect is flat.        Speech: Speech is rapid and pressured.        Behavior: Behavior is withdrawn.        Thought Content: Thought content normal.        Cognition and Memory: Cognition is impaired. Memory is impaired. She exhibits impaired recent memory and impaired remote memory.        Judgment: Judgment normal.           Assessment & Plan:  1. Dementia likely due to Alzheimer's  disease (Primary) - I am fine with her coming off Abilify , we can wean her off after her move to see if this affects her affect.  Her family will let me know when the patient has been moved in and is settled, likely the beginning of August.  Once we have weaned her off Abilify  we can see if she responds to Rexulti  2. Anxiety and depression - Continue with Remeron    3. Agitation - See above  Time spent with patient today was 38 minutes which consisted of chart review,  discussing dementia, agitation and anxiety/depression work up, treatment answering questions and documentation.

## 2023-11-17 ENCOUNTER — Encounter: Payer: Self-pay | Admitting: Adult Health

## 2023-11-19 ENCOUNTER — Encounter: Payer: Self-pay | Admitting: Physician Assistant

## 2023-11-19 ENCOUNTER — Ambulatory Visit: Admitting: Physician Assistant

## 2023-11-19 VITALS — BP 136/64 | HR 74 | Ht 63.0 in | Wt 112.0 lb

## 2023-11-19 DIAGNOSIS — F0280 Dementia in other diseases classified elsewhere without behavioral disturbance: Secondary | ICD-10-CM

## 2023-11-19 DIAGNOSIS — G309 Alzheimer's disease, unspecified: Secondary | ICD-10-CM

## 2023-11-19 MED ORDER — MEMANTINE HCL 10 MG PO TABS: ORAL_TABLET | ORAL | 11 refills | Status: AC

## 2023-11-19 MED ORDER — DONEPEZIL HCL 10 MG PO TABS: 10.0000 mg | ORAL_TABLET | Freq: Every day | ORAL | 4 refills | Status: AC

## 2023-11-19 NOTE — Progress Notes (Signed)
 Assessment/Plan:   Dementia likely due to Alzheimer disease  Emma Schneider is a delightful 73 y.o. 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 Patient is currently on donepezil  10 mg daily and memantine  10 mg twice daily.  Cognitive decline is noted, she needs more assistance with her ADLs than before.  She lives in [place] memory care for social, and cognitive stimulation as well as for 24/7 safety. PCP is addressing daytime sleepiness and insomnia during the night. Mood is controlled.    Follow up in 6  months. Continue donepezil  10 mg daily and memantine  10 mg twice daily, side effects discussed. Will reassess its role during the next visit  Agree with addressing daytime sleepiness and nighttime insomnia with PCP Recommend good control of her cardiovascular risk factors Continue 24/7 monitoring at [facility] memory care   Continue to control mood as per PCP     Subjective:    This patient is accompanied in the office by her 2 daughters who supplements the history.  Previous records as well as any outside records available were reviewed prior to todays visit. Patient was last seen on 05/28/2023.  Last MMSE was 15/30 on November 22, 2022    Any changes in memory since last visit? Not having a good day today, she is very sleepy, has the nights and days reversed. Memory is worse.  Has moved to memory care now.  She has difficulty with both STM and LTM.    Since moving to memory care she is quieter, She is not actively participating in the activities, but is able to join them . She wants to Be with the group. She may be able to play solitaire when her daughter visits.   repeats oneself?  Endorsed Disoriented when walking into a room? Denies    Leaving objects?  May misplace things such as the glasses The station keeps  the hearing aids.    Wandering behavior?  Not now, but while at  Assisted living she would follow someone out.  Not now at memory care Any personality changes since last visit? Emma Schneider Any worsening depression?:  She has a history of depression after the death of her husband Hallucinations or paranoia?  Denies.   Seizures? denies    Any sleep changes?  She walks and paces all night. Does not sleep at night and then she has daytime sleepiness.   Sleep apnea?   Denies.   Any hygiene concerns?  Denies  Independent of bathing and dressing?  She needs assistance. Does the patient needs help with medications?   Facility is in charge   Who is in charge of the finances?  Daughter and daughter is in charge     Any changes in appetite?  denies     Patient have trouble swallowing? Denies.    Any headaches?   denies   Any vision changes?  Chronic R hip pain, worse today after yesterday when she walked a little more than before   Ambulates with difficulty? Denies.   Recent falls or head injuries? Denies.     Unilateral weakness, numbness or tingling? denies   Any tremors?  Occasionally    Any anosmia?  Denies   Any incontinence of urine?  Endorsed, wears Depends    Any bowel dysfunction?  Denies      Patient  lives at [facility]  memory care since the last month  Does the patient drive? No longer drives     Prior MRI brain January 2023 personally reviewed was remarkable for generalized atrophy, and mild to moderate microvascular ischemic changes.     Initial evaluation on 11/15/2021   How long did patient have memory difficulties?  Her family report that the memory issues have been present for about 5 years worse since about Oct 2022. She also has greater difficulty with reading comprehension.  She has more difficulty with crossword puzzles and word finding.  Long-term memory is normal. There are difficulties with multitasking, organization and impulsivity. Patient lives with: Spouse  repeats  oneself? Endorsed occasionally, although her husband says that this is more frequent than prior. Disoriented when walking into a room?  It can be a scary situation if she is in a new environment.   Leaving objects in unusual places?  Patient denies   Ambulates  with difficulty?  Getting a little more wobbly than before . She has to think ahead if she is in uneven ground.   Recent falls?  Patient denies   Any head injuries?  Patient denies   History of seizures?   Patient denies   Wandering behavior?  Patient denies   Patient drives?  Her husband does not allow her to drive alone, and she only drives very short distances, preventing major intersections. Any mood changes?  Family report more impulsivity, she becomes irritable more quickly, especially when confronted.  She was taking Wellbutrin  which was discontinued due to hallucinations. Any history of depression?:  Denies Hallucinations? Around 03/2021 she began having visual hallucinations, such as seeing dogs and cats under her porch, and animals roaming through the house, but after discontinuing Wellbutrin  these hallucinations disappeared.  Paranoia? About 1 year ago it was reported the patient began accusing her husband of having affairs.  This has subsided. Patient reports that she sleeps well without vivid dreams, REM behavior or sleepwalking    History of sleep apnea?  Patient denies   Any hygiene concerns?  Patient denies   Independent of bathing and dressing?  Endorsed  Does the patient needs help with medications?  Husband is in charge Who is in charge of the finances?  Husband is in charge    Any changes in appetite?  Patient denies   Patient have trouble swallowing? Patient denies   Does the patient cook?  Patient denies .  Has forgotten common recipes for the last year, has become more confused between the tsp and Tsp  Any kitchen accidents such as leaving the stove on? Patient denies   Any headaches?  Only when  stressed. Double vision? Patient denies   Any focal numbness or tingling?  Patient denies   Chronic back pain Patient denies   Unilateral weakness?  Patient denies   Any tremors?  Patient denies   Any history of anosmia?  Patient denies   Any incontinence of urine?  Patient denies   Any bowel dysfunction?   Patient denies      History of heavy alcohol intake?  Patient denies   History of heavy tobacco use?  Endorsed Family history of dementia?  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 She continues to smoke   Neuropsych evaluation 10/2021 Dr. Richie  Briefly, results suggested primary impairments surrounding semantic fluency, confrontation naming, and essentially all aspects of learning and memory. Additional weaknesses were exhibited across complex attention, receptive language, and visuospatial abilities, while variability  was exhibited across processing speed, basic attention, and executive functioning. Regarding etiology, I unfortunately have concerns surrounding underlying Alzheimer's disease. Ms. Sutliff did not benefit from repeated exposure to information across learning trials, performed poorly across delayed recall trials, and performed poorly across yes/no recognition trials. Taken together, this suggests evidence for rapid forgetting and an evolving and already quite significant memory storage impairment, both of which are hallmark characteristics of this illness. Additional impairments across confrontation naming and semantic fluency align very well with this illness as they are the expected domains to exhibit impairment following early memory dysfunction. Visuospatial weakness also aligns well with typical disease progression. While I am certainly not able to ascertain the validity of her allegations surrounding her husband's behaviors, both her husband and their daughter denied these actions and the emergence of suspicion and delusional thinking is certainly seen in individuals  with Alzheimer's disease.    PREVIOUS MEDICATIONS:   CURRENT MEDICATIONS:  Outpatient Encounter Medications as of 11/19/2023  Medication Sig   acetaminophen  (TYLENOL ) 325 MG tablet Take 2 tablets (650 mg total) by mouth every 8 (eight) hours as needed.   ALPRAZolam  (XANAX ) 0.25 MG tablet Take 1 tablet (0.25 mg total) by mouth 2 (two) times daily as needed for anxiety.   ARIPiprazole  (ABILIFY ) 2 MG tablet Take 1 tablet (2 mg total) by mouth daily.   atorvastatin  (LIPITOR) 10 MG tablet Take 1 tablet (10 mg total) by mouth daily.   calcium  carbonate (OS-CAL) 600 MG TABS tablet Take 2 tablets (1,200 mg total) by mouth daily.   calcium  carbonate (TUMS) 500 MG chewable tablet Chew 2 tablets (400 mg of elemental calcium  total) by mouth every 6 (six) hours as needed for indigestion or heartburn.   Cholecalciferol  (VITAMIN D3) 25 MCG (1000 UT) CAPS Take 1 capsule (1,000 Units total) by mouth daily.   Dextromethorphan -guaiFENesin  5-100 MG/5ML LIQD Take 10 mLs by mouth every 8 (eight) hours as needed.   FEROSUL 325 (65 Fe) MG tablet Take 325 mg by mouth daily.   ibuprofen  (ADVIL ) 400 MG tablet Take 1 tablet (400 mg total) by mouth every 8 (eight) hours as needed.   levothyroxine  (SYNTHROID ) 25 MCG tablet Take 1 tablet (25 mcg total) by mouth daily before breakfast.   loperamide  (IMODIUM  A-D) 2 MG tablet Take 1 tablet (2 mg total) by mouth every 12 (twelve) hours as needed for diarrhea or loose stools.   mirtazapine  (REMERON ) 30 MG tablet Take 1 tablet (30 mg total) by mouth at bedtime.   pseudoephedrine  (SUDAFED) 30 MG tablet Take 1 tablet (30 mg total) by mouth every 4 (four) hours as needed for congestion.   [DISCONTINUED] donepezil  (ARICEPT ) 10 MG tablet Take 1 tablet (10 mg total) by mouth at bedtime.   [DISCONTINUED] memantine  (NAMENDA ) 10 MG tablet Take one tab two times a day   donepezil  (ARICEPT ) 10 MG tablet Take 1 tablet (10 mg total) by mouth at bedtime.   memantine  (NAMENDA ) 10 MG tablet Take  one tab two times a day   No facility-administered encounter medications on file as of 11/19/2023.       11/22/2022   12:00 PM 05/23/2022    1:00 PM  MMSE - Mini Mental State Exam  Orientation to time 1 4  Orientation to Place 5 5  Registration 2 2  Attention/ Calculation 0 0  Recall 0 2  Language- name 2 objects 2 2  Language- repeat 0 1  Language- follow 3 step command 3 3  Language- read &  follow direction 1 1  Write a sentence 1 1  Copy design 0 0  Total score 15 21       No data to display          Objective:     PHYSICAL EXAMINATION:    VITALS:   Vitals:   11/19/23 1253  BP: 136/64  Pulse: 74  SpO2: 96%  Weight: 112 lb (50.8 kg)  Height: 5' 3 (1.6 m)    GEN:  The patient appears stated age and is in NAD. HEENT:  Normocephalic, atraumatic.   Neurological examination:  General: NAD, well-groomed, appears stated age. Orientation: The patient is not alert, sleepy but very responsive to voice.  Not oriented to person, place and date Cranial nerves: There is good facial symmetry.The speech is fluent and clear. No aphasia or dysarthria. Fund of knowledge is reduced. Recent and remote memory are impaired. Attention and concentration are reduced. Unable to name objects and repeat phrases.  Hearing is mildly decreased to conversational tone.   Sensation: Sensation is intact to light touch throughout Motor: Strength is at least antigravity x4. DTR's 2/4 in UE/LE     Movement examination: Tone: There is normal tone in the UE/LE Abnormal movements:  no tremor today .No myoclonus.  No asterixis.   Coordination:  There L>R decremation with RAM's. Abnormal finger to nose, favors R hand.  Gait and Station: The patient has some difficulty arising out of a deep-seated chair without the use of the hands. The patient's stride length is short.  Gait is cautious and narrow.    Thank you for allowing us  the opportunity to participate in the care of this nice patient.  Please do not hesitate to contact us  for any questions or concerns.   Total time spent on today's visit was 35 minutes dedicated to this patient today, preparing to see patient, examining the patient, ordering tests and/or medications and counseling the patient, documenting clinical information in the EHR or other health record, independently interpreting results and communicating results to the patient/family, discussing treatment and goals, answering patient's questions and coordinating care.  Cc:  Merna Huxley, NP  Camie Sevin 11/19/2023 1:43 PM

## 2023-11-19 NOTE — Patient Instructions (Signed)
 It was a pleasure to see you today at our office.   Recommendations:  Follow up in  6 months   Continue donepezil to 10 mg daily. Side effects were discussed  Continue Memantine 10 mg increase it to 2 times a day     Whom to call:  Memory  decline, memory medications: Call our office [phone]   For psychiatric meds, mood meds: Please have your primary care physician manage these medications.     For assessment of decision of mental capacity and competency:  Call Dr. Erick Blinks, geriatric psychiatrist at [phone]    For guidance regarding [facility]   Sidney Ace, Social Worker tel: [phone]  If you have any severe symptoms of a stroke, or other severe issues such as confusion,severe chills or fever, etc call 911 or go to the ER as you may need to be evaluated further       RECOMMENDATIONS FOR ALL PATIENTS WITH MEMORY PROBLEMS: 1. Continue to exercise (Recommend 30 minutes of walking everyday, or 3 hours every week) 2. Increase social interactions - continue going to [place] and enjoy social gatherings with friends and family 3. Eat healthy, avoid fried foods and eat more fruits and vegetables 4. Maintain adequate blood pressure, blood sugar, and blood cholesterol level. Reducing the risk of stroke and cardiovascular disease also helps promoting better memory. 5. Avoid stressful situations. Live a simple life and avoid aggravations. Organize your time and prepare for the next day in anticipation. 6. Sleep well, avoid any interruptions of sleep and avoid any distractions in the bedroom that may interfere with adequate sleep quality 7. Avoid sugar, avoid sweets as there is a strong link between excessive sugar intake, diabetes, and cognitive impairment We discussed the Mediterranean diet, which has been shown to help patients reduce the risk of progressive memory disorders and reduces cardiovascular risk. This includes eating fish, eat fruits  and green leafy vegetables, nuts like almonds and hazelnuts, walnuts, and also use olive oil. Avoid fast foods and fried foods as much as possible. Avoid sweets and sugar as sugar use has been linked to worsening of memory function.  There is always a concern of gradual progression of memory problems. If this is the case, then we may need to adjust level of care according to patient needs. Support, both to the patient and caregiver, should then be put into place.    The Alzheimer's Association is here all day, every day for people facing Alzheimer's disease through our free 24/7 Helpline: [phone]) [phone]. The Helpline provides reliable information and support to all those who need assistance, such as individuals living with memory loss, Alzheimer's or other dementia, caregivers, health care professionals and the public.  Our highly trained and knowledgeable staff can help you with: Understanding memory loss, dementia and Alzheimer's  Medications and other treatment options  General information about aging and brain health  Skills to provide quality care and to find the best care from professionals  Legal, financial and living-arrangement decisions Our Helpline also features: Confidential care consultation provided by master's level clinicians who can help with decision-making support, crisis assistance and education on issues families face every day  Help in a caller's preferred language using our translation service that features more than 200 languages and dialects  Referrals to local community programs, services and ongoing support     FALL PRECAUTIONS: Be cautious when walking. Scan the area for obstacles that may increase the risk of trips and falls. When getting  up in the mornings, sit up at the edge of the bed for a few minutes before getting out of bed. Consider elevating the bed at the head end to avoid drop of blood pressure when getting up. Walk always in a well-lit room (use night lights  in the walls). Avoid area rugs or power cords from appliances in the middle of the walkways. Use a walker or a cane if necessary and consider physical therapy for balance exercise. Get your eyesight checked regularly.  FINANCIAL OVERSIGHT: Supervision, especially oversight when making financial decisions or transactions is also recommended.  HOME SAFETY: Consider the safety of the kitchen when operating appliances like stoves, microwave oven, and blender. Consider having supervision and share cooking responsibilities until no longer able to participate in those. Accidents with firearms and other hazards in the house should be identified and addressed as well.   ABILITY TO BE LEFT ALONE: If patient is unable to contact 911 operator, consider using LifeLine, or when the need is there, arrange for someone to stay with patients. Smoking is a fire hazard, consider supervision or cessation. Risk of wandering should be assessed by caregiver and if detected at any point, supervision and safe proof recommendations should be instituted.  MEDICATION SUPERVISION: Inability to self-administer medication needs to be constantly addressed. Implement a mechanism to ensure safe administration of the medications.     Mediterranean Diet A Mediterranean diet refers to food and lifestyle choices that are based on the traditions of countries located on the [employer]. This way of eating has been shown to help prevent certain conditions and improve outcomes for people who have chronic diseases, like kidney disease and heart disease. What are tips for following this plan? Lifestyle  Cook and eat meals together with your family, when possible. Drink enough fluid to keep your urine clear or pale yellow. Be physically active every day. This includes: Aerobic exercise like running or swimming. Leisure activities like gardening, walking, or housework. Get 7-8 hours of sleep each night. If recommended by your health  care provider, drink red wine in moderation. This means 1 glass a day for nonpregnant women and 2 glasses a day for men. A glass of wine equals 5 oz (150 mL). Reading food labels  Check the serving size of packaged foods. For foods such as rice and pasta, the serving size refers to the amount of cooked product, not dry. Check the total fat in packaged foods. Avoid foods that have saturated fat or trans fats. Check the ingredients list for added sugars, such as corn syrup. Shopping  At the grocery store, buy most of your food from the areas near the walls of the store. This includes: Fresh fruits and vegetables (produce). Grains, beans, nuts, and seeds. Some of these may be available in unpackaged forms or large amounts (in bulk). Fresh seafood. Poultry and eggs. Low-fat dairy products. Buy whole ingredients instead of prepackaged foods. Buy fresh fruits and vegetables in-season from local farmers markets. Buy frozen fruits and vegetables in resealable bags. If you do not have access to quality fresh seafood, buy precooked frozen shrimp or canned fish, such as tuna, salmon, or sardines. Buy small amounts of raw or cooked vegetables, salads, or olives from the deli or salad bar at your store. Stock your pantry so you always have certain foods on hand, such as olive oil, canned tuna, canned tomatoes, rice, pasta, and beans. Cooking  Cook foods with extra-virgin olive oil instead of using butter or other vegetable  oils. Have meat as a side dish, and have vegetables or grains as your main dish. This means having meat in small portions or adding small amounts of meat to foods like pasta or stew. Use beans or vegetables instead of meat in common dishes like chili or lasagna. Experiment with different cooking methods. Try roasting or broiling vegetables instead of steaming or sauteing them. Add frozen vegetables to soups, stews, pasta, or rice. Add nuts or seeds for added healthy fat at each meal.  You can add these to yogurt, salads, or vegetable dishes. Marinate fish or vegetables using olive oil, lemon juice, garlic, and fresh herbs. Meal planning  Plan to eat 1 vegetarian meal one day each week. Try to work up to 2 vegetarian meals, if possible. Eat seafood 2 or more times a week. Have healthy snacks readily available, such as: Vegetable sticks with hummus. Greek yogurt. Fruit and nut trail mix. Eat balanced meals throughout the week. This includes: Fruit: 2-3 servings a day Vegetables: 4-5 servings a day Low-fat dairy: 2 servings a day Fish, poultry, or lean meat: 1 serving a day Beans and legumes: 2 or more servings a week Nuts and seeds: 1-2 servings a day Whole grains: 6-8 servings a day Extra-virgin olive oil: 3-4 servings a day Limit red meat and sweets to only a few servings a month What are my food choices? Mediterranean diet Recommended Grains: Whole-grain pasta. Brown rice. Bulgar wheat. Polenta. Couscous. Whole-wheat bread. Orpah Cobb. Vegetables: Artichokes. Beets. Broccoli. Cabbage. Carrots. Eggplant. Green beans. Chard. Kale. Spinach. Onions. Leeks. Peas. Squash. Tomatoes. Peppers. Radishes. Fruits: Apples. Apricots. Avocado. Berries. Bananas. Cherries. Dates. Figs. Grapes. Lemons. Melon. Oranges. Peaches. Plums. Pomegranate. Meats and other protein foods: Beans. Almonds. Sunflower seeds. Pine nuts. Peanuts. Cod. Salmon. Scallops. Shrimp. Tuna. Tilapia. Clams. Oysters. Eggs. Dairy: Low-fat milk. Cheese. Greek yogurt. Beverages: Water. Red wine. Herbal tea. Fats and oils: Extra virgin olive oil. Avocado oil. Grape seed oil. Sweets and desserts: [place] yogurt with honey. Baked apples. Poached pears. Trail mix. Seasoning and other foods: Basil. Cilantro. Coriander. Cumin. Mint. Parsley. Sage. Rosemary. Tarragon. Garlic. Oregano. Thyme. Pepper. Balsalmic vinegar. Tahini. Hummus. Tomato sauce. Olives. Mushrooms. Limit these Grains: Prepackaged pasta or rice  dishes. Prepackaged cereal with added sugar. Vegetables: Deep fried potatoes (french fries). Fruits: Fruit canned in syrup. Meats and other protein foods: Beef. Pork. Lamb. Poultry with skin. Hot dogs. Tomasa Blase. Dairy: Ice cream. Sour cream. Whole milk. Beverages: Juice. Sugar-sweetened soft drinks. Beer. Liquor and spirits. Fats and oils: Butter. Canola oil. Vegetable oil. Beef fat (tallow). Lard. Sweets and desserts: Cookies. Cakes. Pies. Candy. Seasoning and other foods: Mayonnaise. Premade sauces and marinades. The items listed may not be a complete list. Talk with your dietitian about what dietary choices are right for you. Summary The Mediterranean diet includes both food and lifestyle choices. Eat a variety of fresh fruits and vegetables, beans, nuts, seeds, and whole grains. Limit the amount of red meat and sweets that you eat. Talk with your health care provider about whether it is safe for you to drink red wine in moderation. This means 1 glass a day for nonpregnant women and 2 glasses a day for men. A glass of wine equals 5 oz (150 mL). This information is not intended to replace advice given to you by your health care provider. Make sure you discuss any questions you have with your health care provider. Document Released: 12/07/2015 Document Revised: 01/09/2016 Document Reviewed: 12/07/2015 Elsevier Interactive Patient Education  2017 [employer].

## 2023-11-20 ENCOUNTER — Other Ambulatory Visit: Payer: Self-pay | Admitting: Adult Health

## 2023-11-20 DIAGNOSIS — F419 Anxiety disorder, unspecified: Secondary | ICD-10-CM

## 2023-11-20 MED ORDER — ALPRAZOLAM 0.25 MG PO TABS: 0.2500 mg | ORAL_TABLET | Freq: Every day | ORAL | 0 refills | Status: DC

## 2023-12-04 ENCOUNTER — Encounter: Payer: Self-pay | Admitting: Acute Care

## 2023-12-30 ENCOUNTER — Telehealth: Payer: Self-pay | Admitting: Acute Care

## 2023-12-30 DIAGNOSIS — Z122 Encounter for screening for malignant neoplasm of respiratory organs: Secondary | ICD-10-CM

## 2023-12-30 DIAGNOSIS — F17210 Nicotine dependence, cigarettes, uncomplicated: Secondary | ICD-10-CM

## 2023-12-30 DIAGNOSIS — Z87891 Personal history of nicotine dependence: Secondary | ICD-10-CM

## 2023-12-30 NOTE — Telephone Encounter (Signed)
 Returned call from VM to daughter Jillian to schedule mother's annual LDCT.  There was no answer and left her a VM to call for scheduling.  New order placed as previous order has expired.

## 2023-12-31 ENCOUNTER — Encounter: Payer: Self-pay | Admitting: Adult Health

## 2023-12-31 ENCOUNTER — Ambulatory Visit: Admitting: Adult Health

## 2023-12-31 VITALS — BP 124/62 | HR 63 | Temp 97.9°F | Ht 63.0 in | Wt 114.0 lb

## 2023-12-31 DIAGNOSIS — J988 Other specified respiratory disorders: Secondary | ICD-10-CM

## 2023-12-31 MED ORDER — DOXYCYCLINE HYCLATE 100 MG PO CAPS: 100.0000 mg | ORAL_CAPSULE | Freq: Two times a day (BID) | ORAL | 0 refills | Status: DC

## 2023-12-31 MED ORDER — PREDNISONE 10 MG PO TABS: 10.0000 mg | ORAL_TABLET | Freq: Every day | ORAL | 0 refills | Status: DC

## 2023-12-31 NOTE — Progress Notes (Signed)
 Subjective:    Patient ID: Emma Schneider, female    DOB: [birth year], 73 y.o.   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  She office today with her daughter for concern of fatigue.  She is currently residing in a memory care facility and staff notified family that they were concerned because over the last 5 days the patient has been sleeping more throughout the day.  Staff had a hard time waking her up in the morning for breakfast and she will also take an extra.  She is sleeping well throughout the night.  Family has not noticed any coughing, fevers, or chills.   Review of Systems See HPI   Past Medical History:  Diagnosis Date   Adenoma of right adrenal gland 12/16/2014   Adrenal incidentaloma (HCC)    AML (acute myeloid leukemia) in remission 09/28/1998   completed tx 01/1999 - remission since, sees [facility]. Dr. 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 Will repeat dexa this year and Rx risidronate in the mean time. Asked pt to find out from PCP if they had  prescribed Actonel prior so we can determine how many year   Personal history of colonic polyps 09/27/2016   3 polyps removed, repeated 02/2020, due 3 yrs   Thyroid  dysfunction 05/11/2019   dx 02/2019, started on Euthyrox    Tobacco use 01/06/2015   Vitamin D  deficiency     Social History   Socioeconomic History   Marital status: Married    Spouse name: Not on file   Number of children: Not on file   Years of education: 13   Highest education level: Some college, no degree  Occupational History   Occupation: Retired    Comment: Sales executive  Tobacco Use   Smoking status: Former    Current packs/day: 0.00    Types: Cigarettes    Quit date: 03/04/2023    Years since quitting: 0.8   Smokeless tobacco: Never   Tobacco comments:    Is actively trying to cut down number of cigarettes, but not ready to set a quit date. 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    -Married for 30 years    - Has step children, all live in [place]   - No pets   - Plays games on the  computer and enjoys yard work.    Two story home   Caffeine a lot of pepsi   Social Drivers of Health   Financial Resource Strain: Low Risk  (08/04/2023)   Overall Financial Resource Strain (CARDIA)    Difficulty of Paying Living Expenses: Not hard at all  Food Insecurity: No Food Insecurity (08/04/2023)   Hunger Vital Sign    Worried About Running Out of Food in the Last Year: Never true    Ran Out of Food in the Last Year: Never true  Transportation Needs: No Transportation Needs (08/04/2023)   PRAPARE - Administrator, Civil Service (Medical): No    Lack of Transportation (Non-Medical): No  Physical Activity: Inactive (08/04/2023)   Exercise  Vital Sign    Days of Exercise per Week: 0 days    Minutes of Exercise per Session: 0 min  Stress: No Stress Concern Present (08/04/2023)   [employer] of Occupational Health - Occupational Stress Questionnaire    Feeling of Stress : Not at all  Social Connections: Moderately Integrated (08/04/2023)   Social Connection and Isolation Panel    Frequency of Communication with Friends and Family: More than three times a week    Frequency of Social Gatherings with Friends and Family: More than three times a week    Attends Religious Services: More than 4 times per year    Active Member of [employer] or Organizations: Yes    Attends Banker Meetings: More than 4 times per year    Marital Status: Widowed  Intimate Partner Violence: Not At Risk (08/04/2023)   Humiliation, Afraid, Rape, and Kick questionnaire    Fear of Current or Ex-Partner: No    Emotionally Abused: No    Physically Abused: No    Sexually Abused: No    No past surgical history on file.  Family History  Problem Relation Age of Onset   Congestive Heart Failure Mother    Dementia Mother        late in life   Heart attack Father    Heart disease Father     Allergies  Allergen Reactions   Wellbutrin  [Bupropion ] Other (See Comments)    Hallucinations     Current Outpatient Medications on File Prior to Visit  Medication Sig Dispense Refill   acetaminophen  (TYLENOL ) 325 MG tablet Take 2 tablets (650 mg total) by mouth every 8 (eight) hours as needed. 30 tablet 0   ALPRAZolam  (XANAX ) 0.25 MG tablet Take 1 tablet (0.25 mg total) by mouth at bedtime. 90 tablet 0   ARIPiprazole  (ABILIFY ) 2 MG tablet Take 1 tablet (2 mg total) by mouth daily. 90 tablet 1   atorvastatin  (LIPITOR) 10 MG tablet Take 1 tablet (10 mg total) by mouth daily. 90 tablet 3   calcium  carbonate (OS-CAL) 600 MG TABS tablet Take 2 tablets (1,200 mg total) by mouth daily. 90 tablet 3   calcium  carbonate (TUMS) 500 MG chewable tablet Chew 2 tablets  (400 mg of elemental calcium  total) by mouth every 6 (six) hours as needed for indigestion or heartburn. 60 tablet 0   Cholecalciferol  (VITAMIN D3) 25 MCG (1000 UT) CAPS Take 1 capsule (1,000 Units total) by mouth daily. 90 capsule 3   Dextromethorphan -guaiFENesin  5-100 MG/5ML LIQD Take 10 mLs by mouth every 8 (eight) hours as needed. 10 mL 0   donepezil  (ARICEPT ) 10 MG tablet Take 1 tablet (10 mg total) by mouth at bedtime. 90 tablet 4   FEROSUL 325 (65  Fe) MG tablet Take 325 mg by mouth daily.     ibuprofen  (ADVIL ) 400 MG tablet Take 1 tablet (400 mg total) by mouth every 8 (eight) hours as needed. 30 tablet 0   levothyroxine  (SYNTHROID ) 25 MCG tablet Take 1 tablet (25 mcg total) by mouth daily before breakfast. 90 tablet 3   loperamide  (IMODIUM  A-D) 2 MG tablet Take 1 tablet (2 mg total) by mouth every 12 (twelve) hours as needed for diarrhea or loose stools. 30 tablet 0   memantine  (NAMENDA ) 10 MG tablet Take one tab two times a day 60 tablet 11   mirtazapine  (REMERON ) 30 MG tablet Take 1 tablet (30 mg total) by mouth at bedtime. 90 tablet 1   pseudoephedrine  (SUDAFED) 30 MG tablet Take 1 tablet (30 mg total) by mouth every 4 (four) hours as needed for congestion. 30 tablet 0   No current facility-administered medications on file prior to visit.    BP 124/62   Pulse 63   Temp 97.9 F (36.6 C) (Oral)   Ht 5' 3 (1.6 m)   Wt 114 lb (51.7 kg)   SpO2 96%   BMI 20.19 kg/m       Objective:   Physical Exam Vitals and nursing note reviewed.  Constitutional:      Appearance: Normal appearance.  HENT:     Nose: No congestion or rhinorrhea.     Right Turbinates: Not enlarged or swollen.     Left Turbinates: Not enlarged or swollen.     Mouth/Throat:     Lips: Pink.     Mouth: Mucous membranes are moist.     Pharynx: Oropharynx is clear. Uvula midline.     Tonsils: No tonsillar exudate.  Cardiovascular:     Rate and Rhythm: Normal rate and regular rhythm.     Pulses: Normal pulses.      Heart sounds: Normal heart sounds.  Pulmonary:     Effort: Pulmonary effort is normal.     Breath sounds: Examination of the right-upper field reveals wheezing. Examination of the left-upper field reveals wheezing. Examination of the left-lower field reveals rhonchi. Wheezing and rhonchi present. No rales.  Skin:    General: Skin is warm and dry.  Neurological:     Mental Status: She is alert. Mental status is at baseline. She is disoriented.  Psychiatric:        Behavior: Behavior is slowed.        Cognition and Memory: Cognition is impaired. Memory is impaired. She exhibits impaired recent memory and impaired remote memory.        Assessment & Plan:  1. Respiratory infection (Primary) - Exam she did have some wheezing in her upper lung fields and some rhonchi in the left lower lung field.  Will treat for respiratory infection will cover for pneumonia with doxycycline  and prednisone .  Advise follow-up if symptoms not improving next 3 to 4 days or sooner if symptoms worsen. - doxycycline  (VIBRAMYCIN ) 100 MG capsule; Take 1 capsule (100 mg total) by mouth 2 (two) times daily.  Dispense: 14 capsule; Refill: 0 - predniSONE  (DELTASONE ) 10 MG tablet; Take 1 tablet (10 mg total) by mouth daily with breakfast.  Dispense: 5 tablet; Refill: 0   Darleene Shape, NP

## 2024-01-05 ENCOUNTER — Ambulatory Visit: Payer: Medicare PPO | Admitting: Physician Assistant

## 2024-01-08 ENCOUNTER — Ambulatory Visit: Admitting: Adult Health

## 2024-01-27 DIAGNOSIS — M810 Age-related osteoporosis without current pathological fracture: Secondary | ICD-10-CM | POA: Diagnosis not present

## 2024-01-27 DIAGNOSIS — E039 Hypothyroidism, unspecified: Secondary | ICD-10-CM | POA: Diagnosis not present

## 2024-01-27 DIAGNOSIS — Z8639 Personal history of other endocrine, nutritional and metabolic disease: Secondary | ICD-10-CM | POA: Diagnosis not present

## 2024-02-16 DIAGNOSIS — M6259 Muscle wasting and atrophy, not elsewhere classified, multiple sites: Secondary | ICD-10-CM | POA: Diagnosis not present

## 2024-02-16 DIAGNOSIS — R278 Other lack of coordination: Secondary | ICD-10-CM | POA: Diagnosis not present

## 2024-02-16 DIAGNOSIS — R2681 Unsteadiness on feet: Secondary | ICD-10-CM | POA: Diagnosis not present

## 2024-02-16 DIAGNOSIS — R2689 Other abnormalities of gait and mobility: Secondary | ICD-10-CM | POA: Diagnosis not present

## 2024-02-17 ENCOUNTER — Other Ambulatory Visit: Payer: Self-pay | Admitting: Adult Health

## 2024-02-17 ENCOUNTER — Encounter: Payer: Self-pay | Admitting: Adult Health

## 2024-02-17 DIAGNOSIS — F419 Anxiety disorder, unspecified: Secondary | ICD-10-CM

## 2024-02-17 MED ORDER — ALPRAZOLAM 0.25 MG PO TABS
0.2500 mg | ORAL_TABLET | Freq: Every day | ORAL | 1 refills | Status: AC
Start: 2024-02-17 — End: ?

## 2024-02-17 NOTE — Telephone Encounter (Signed)
**Note De-identified  [place] Obfuscation** Please advise 

## 2024-02-20 DIAGNOSIS — R278 Other lack of coordination: Secondary | ICD-10-CM | POA: Diagnosis not present

## 2024-02-20 DIAGNOSIS — M6259 Muscle wasting and atrophy, not elsewhere classified, multiple sites: Secondary | ICD-10-CM | POA: Diagnosis not present

## 2024-02-20 DIAGNOSIS — R2689 Other abnormalities of gait and mobility: Secondary | ICD-10-CM | POA: Diagnosis not present

## 2024-02-20 DIAGNOSIS — R2681 Unsteadiness on feet: Secondary | ICD-10-CM | POA: Diagnosis not present

## 2024-02-23 DIAGNOSIS — R2681 Unsteadiness on feet: Secondary | ICD-10-CM | POA: Diagnosis not present

## 2024-02-23 DIAGNOSIS — R278 Other lack of coordination: Secondary | ICD-10-CM | POA: Diagnosis not present

## 2024-02-23 DIAGNOSIS — M6259 Muscle wasting and atrophy, not elsewhere classified, multiple sites: Secondary | ICD-10-CM | POA: Diagnosis not present

## 2024-02-23 DIAGNOSIS — R2689 Other abnormalities of gait and mobility: Secondary | ICD-10-CM | POA: Diagnosis not present

## 2024-02-27 DIAGNOSIS — R2689 Other abnormalities of gait and mobility: Secondary | ICD-10-CM | POA: Diagnosis not present

## 2024-02-27 DIAGNOSIS — R278 Other lack of coordination: Secondary | ICD-10-CM | POA: Diagnosis not present

## 2024-02-27 DIAGNOSIS — R2681 Unsteadiness on feet: Secondary | ICD-10-CM | POA: Diagnosis not present

## 2024-02-27 DIAGNOSIS — M6259 Muscle wasting and atrophy, not elsewhere classified, multiple sites: Secondary | ICD-10-CM | POA: Diagnosis not present

## 2024-03-01 DIAGNOSIS — R278 Other lack of coordination: Secondary | ICD-10-CM | POA: Diagnosis not present

## 2024-03-01 DIAGNOSIS — R2681 Unsteadiness on feet: Secondary | ICD-10-CM | POA: Diagnosis not present

## 2024-03-01 DIAGNOSIS — R2689 Other abnormalities of gait and mobility: Secondary | ICD-10-CM | POA: Diagnosis not present

## 2024-03-01 DIAGNOSIS — M6259 Muscle wasting and atrophy, not elsewhere classified, multiple sites: Secondary | ICD-10-CM | POA: Diagnosis not present

## 2024-03-02 DIAGNOSIS — R2681 Unsteadiness on feet: Secondary | ICD-10-CM | POA: Diagnosis not present

## 2024-03-02 DIAGNOSIS — M6259 Muscle wasting and atrophy, not elsewhere classified, multiple sites: Secondary | ICD-10-CM | POA: Diagnosis not present

## 2024-03-02 DIAGNOSIS — R278 Other lack of coordination: Secondary | ICD-10-CM | POA: Diagnosis not present

## 2024-03-02 DIAGNOSIS — R2689 Other abnormalities of gait and mobility: Secondary | ICD-10-CM | POA: Diagnosis not present

## 2024-03-04 DIAGNOSIS — R278 Other lack of coordination: Secondary | ICD-10-CM | POA: Diagnosis not present

## 2024-03-04 DIAGNOSIS — M6259 Muscle wasting and atrophy, not elsewhere classified, multiple sites: Secondary | ICD-10-CM | POA: Diagnosis not present

## 2024-03-04 DIAGNOSIS — R2689 Other abnormalities of gait and mobility: Secondary | ICD-10-CM | POA: Diagnosis not present

## 2024-03-04 DIAGNOSIS — R2681 Unsteadiness on feet: Secondary | ICD-10-CM | POA: Diagnosis not present

## 2024-03-08 DIAGNOSIS — R2681 Unsteadiness on feet: Secondary | ICD-10-CM | POA: Diagnosis not present

## 2024-03-08 DIAGNOSIS — R278 Other lack of coordination: Secondary | ICD-10-CM | POA: Diagnosis not present

## 2024-03-08 DIAGNOSIS — M6259 Muscle wasting and atrophy, not elsewhere classified, multiple sites: Secondary | ICD-10-CM | POA: Diagnosis not present

## 2024-03-09 DIAGNOSIS — R234 Changes in skin texture: Secondary | ICD-10-CM | POA: Diagnosis not present

## 2024-03-09 DIAGNOSIS — M79671 Pain in right foot: Secondary | ICD-10-CM | POA: Diagnosis not present

## 2024-03-09 DIAGNOSIS — M2010 Hallux valgus (acquired), unspecified foot: Secondary | ICD-10-CM | POA: Diagnosis not present

## 2024-03-09 DIAGNOSIS — M21629 Bunionette of unspecified foot: Secondary | ICD-10-CM | POA: Diagnosis not present

## 2024-03-09 DIAGNOSIS — M79672 Pain in left foot: Secondary | ICD-10-CM | POA: Diagnosis not present

## 2024-03-09 DIAGNOSIS — I739 Peripheral vascular disease, unspecified: Secondary | ICD-10-CM | POA: Diagnosis not present

## 2024-03-09 DIAGNOSIS — L600 Ingrowing nail: Secondary | ICD-10-CM | POA: Diagnosis not present

## 2024-03-09 DIAGNOSIS — I8393 Asymptomatic varicose veins of bilateral lower extremities: Secondary | ICD-10-CM | POA: Diagnosis not present

## 2024-03-09 DIAGNOSIS — B351 Tinea unguium: Secondary | ICD-10-CM | POA: Diagnosis not present

## 2024-03-11 ENCOUNTER — Encounter: Payer: Self-pay | Admitting: Adult Health

## 2024-03-11 DIAGNOSIS — M6259 Muscle wasting and atrophy, not elsewhere classified, multiple sites: Secondary | ICD-10-CM | POA: Diagnosis not present

## 2024-03-11 DIAGNOSIS — R2689 Other abnormalities of gait and mobility: Secondary | ICD-10-CM | POA: Diagnosis not present

## 2024-03-11 DIAGNOSIS — R2681 Unsteadiness on feet: Secondary | ICD-10-CM | POA: Diagnosis not present

## 2024-03-11 DIAGNOSIS — R278 Other lack of coordination: Secondary | ICD-10-CM | POA: Diagnosis not present

## 2024-03-11 NOTE — Telephone Encounter (Signed)
**Note De-identified  [place] Obfuscation** Please advise 

## 2024-03-12 ENCOUNTER — Other Ambulatory Visit: Payer: Self-pay | Admitting: Adult Health

## 2024-03-12 MED ORDER — ARIPIPRAZOLE 2 MG PO TABS: ORAL_TABLET | ORAL | 0 refills | Status: DC

## 2024-03-15 DIAGNOSIS — R2689 Other abnormalities of gait and mobility: Secondary | ICD-10-CM | POA: Diagnosis not present

## 2024-03-17 DIAGNOSIS — R2689 Other abnormalities of gait and mobility: Secondary | ICD-10-CM | POA: Diagnosis not present

## 2024-03-22 DIAGNOSIS — R2681 Unsteadiness on feet: Secondary | ICD-10-CM | POA: Diagnosis not present

## 2024-03-22 DIAGNOSIS — R41841 Cognitive communication deficit: Secondary | ICD-10-CM | POA: Diagnosis not present

## 2024-03-22 DIAGNOSIS — M6259 Muscle wasting and atrophy, not elsewhere classified, multiple sites: Secondary | ICD-10-CM | POA: Diagnosis not present

## 2024-03-22 DIAGNOSIS — R2689 Other abnormalities of gait and mobility: Secondary | ICD-10-CM | POA: Diagnosis not present

## 2024-03-22 DIAGNOSIS — R278 Other lack of coordination: Secondary | ICD-10-CM | POA: Diagnosis not present

## 2024-03-23 DIAGNOSIS — R2689 Other abnormalities of gait and mobility: Secondary | ICD-10-CM | POA: Diagnosis not present

## 2024-03-23 DIAGNOSIS — R2681 Unsteadiness on feet: Secondary | ICD-10-CM | POA: Diagnosis not present

## 2024-03-23 DIAGNOSIS — M6259 Muscle wasting and atrophy, not elsewhere classified, multiple sites: Secondary | ICD-10-CM | POA: Diagnosis not present

## 2024-03-23 DIAGNOSIS — R278 Other lack of coordination: Secondary | ICD-10-CM | POA: Diagnosis not present

## 2024-03-26 DIAGNOSIS — R2689 Other abnormalities of gait and mobility: Secondary | ICD-10-CM | POA: Diagnosis not present

## 2024-03-26 DIAGNOSIS — R278 Other lack of coordination: Secondary | ICD-10-CM | POA: Diagnosis not present

## 2024-03-26 DIAGNOSIS — M6259 Muscle wasting and atrophy, not elsewhere classified, multiple sites: Secondary | ICD-10-CM | POA: Diagnosis not present

## 2024-03-26 DIAGNOSIS — R2681 Unsteadiness on feet: Secondary | ICD-10-CM | POA: Diagnosis not present

## 2024-03-29 ENCOUNTER — Emergency Department (HOSPITAL_COMMUNITY)

## 2024-03-29 ENCOUNTER — Emergency Department (HOSPITAL_COMMUNITY): Admission: EM | Admit: 2024-03-29 | Discharge: 2024-03-29 | Disposition: A | Discharge status: Home / Self Care

## 2024-03-29 ENCOUNTER — Ambulatory Visit: Payer: Self-pay

## 2024-03-29 ENCOUNTER — Encounter: Payer: Self-pay | Admitting: Adult Health

## 2024-03-29 DIAGNOSIS — W19XXXA Unspecified fall, initial encounter: Secondary | ICD-10-CM

## 2024-03-29 DIAGNOSIS — I700 Atherosclerosis of aorta: Secondary | ICD-10-CM | POA: Diagnosis not present

## 2024-03-29 DIAGNOSIS — M51369 Other intervertebral disc degeneration, lumbar region without mention of lumbar back pain or lower extremity pain: Secondary | ICD-10-CM | POA: Diagnosis not present

## 2024-03-29 DIAGNOSIS — D3501 Benign neoplasm of right adrenal gland: Secondary | ICD-10-CM | POA: Diagnosis not present

## 2024-03-29 DIAGNOSIS — W1830XA Fall on same level, unspecified, initial encounter: Secondary | ICD-10-CM | POA: Insufficient documentation

## 2024-03-29 DIAGNOSIS — K5900 Constipation, unspecified: Secondary | ICD-10-CM | POA: Diagnosis not present

## 2024-03-29 DIAGNOSIS — G309 Alzheimer's disease, unspecified: Secondary | ICD-10-CM | POA: Insufficient documentation

## 2024-03-29 DIAGNOSIS — F0280 Dementia in other diseases classified elsewhere without behavioral disturbance: Secondary | ICD-10-CM | POA: Insufficient documentation

## 2024-03-29 DIAGNOSIS — M47812 Spondylosis without myelopathy or radiculopathy, cervical region: Secondary | ICD-10-CM | POA: Diagnosis not present

## 2024-03-29 DIAGNOSIS — Y92002 Bathroom of unspecified non-institutional (private) residence single-family (private) house as the place of occurrence of the external cause: Secondary | ICD-10-CM | POA: Diagnosis not present

## 2024-03-29 DIAGNOSIS — I6782 Cerebral ischemia: Secondary | ICD-10-CM | POA: Diagnosis not present

## 2024-03-29 DIAGNOSIS — S060X0A Concussion without loss of consciousness, initial encounter: Secondary | ICD-10-CM | POA: Insufficient documentation

## 2024-03-29 DIAGNOSIS — M25551 Pain in right hip: Secondary | ICD-10-CM | POA: Diagnosis not present

## 2024-03-29 DIAGNOSIS — R410 Disorientation, unspecified: Secondary | ICD-10-CM | POA: Diagnosis present

## 2024-03-29 DIAGNOSIS — K5730 Diverticulosis of large intestine without perforation or abscess without bleeding: Secondary | ICD-10-CM | POA: Diagnosis not present

## 2024-03-29 DIAGNOSIS — R220 Localized swelling, mass and lump, head: Secondary | ICD-10-CM | POA: Diagnosis not present

## 2024-03-29 DIAGNOSIS — M4802 Spinal stenosis, cervical region: Secondary | ICD-10-CM | POA: Diagnosis not present

## 2024-03-29 DIAGNOSIS — Z043 Encounter for examination and observation following other accident: Secondary | ICD-10-CM | POA: Diagnosis not present

## 2024-03-29 DIAGNOSIS — M4804 Spinal stenosis, thoracic region: Secondary | ICD-10-CM | POA: Diagnosis not present

## 2024-03-29 DIAGNOSIS — J439 Emphysema, unspecified: Secondary | ICD-10-CM | POA: Insufficient documentation

## 2024-03-29 LAB — COMPREHENSIVE METABOLIC PANEL WITH GFR
ALT: 21 U/L (ref 0–44)
AST: 29 U/L (ref 15–41)
Albumin: 4 g/dL (ref 3.5–5.0)
Alkaline Phosphatase: 70 U/L (ref 38–126)
Anion gap: 11 (ref 5–15)
BUN: 27 mg/dL — ABNORMAL HIGH (ref 8–23)
CO2: 27 mmol/L (ref 22–32)
Calcium: 10 mg/dL (ref 8.9–10.3)
Chloride: 102 mmol/L (ref 98–111)
Creatinine, Ser: 0.89 mg/dL (ref 0.44–1.00)
GFR, Estimated: >60 mL/min (ref >60)
Glucose, Bld: 108 mg/dL — ABNORMAL HIGH (ref 70–99)
Potassium: 3.9 mmol/L (ref 3.5–5.1)
Sodium: 139 mmol/L (ref 135–145)
Total Bilirubin: 0.5 mg/dL (ref 0.0–1.2)
Total Protein: 7 g/dL (ref 6.5–8.1)

## 2024-03-29 LAB — CBC WITH DIFFERENTIAL/PLATELET
Abs Immature Granulocytes: 0.02 K/uL (ref 0.00–0.07)
Basophils Absolute: 0 K/uL (ref 0.0–0.1)
Basophils Relative: 1 %
Eosinophils Absolute: 0.1 K/uL (ref 0.0–0.5)
Eosinophils Relative: 1 %
HCT: 37.8 % (ref 36.0–46.0)
Hemoglobin: 12.4 g/dL (ref 12.0–15.0)
Immature Granulocytes: 0 %
Lymphocytes Relative: 24 %
Lymphs Abs: 1.8 K/uL (ref 0.7–4.0)
MCH: 30.2 pg (ref 26.0–34.0)
MCHC: 32.8 g/dL (ref 30.0–36.0)
MCV: 92 fL (ref 80.0–100.0)
Monocytes Absolute: 0.6 K/uL (ref 0.1–1.0)
Monocytes Relative: 8 %
Neutro Abs: 5.2 K/uL (ref 1.7–7.7)
Neutrophils Relative %: 66 %
Platelets: 267 K/uL (ref 150–400)
RBC: 4.11 MIL/uL (ref 3.87–5.11)
RDW: 12.7 % (ref 11.5–15.5)
WBC: 7.7 K/uL (ref 4.0–10.5)
nRBC: 0 % (ref 0.0–0.2)

## 2024-03-29 LAB — I-STAT CG4 LACTIC ACID, ED
Lactic Acid, Venous: 0.7 mmol/L (ref 0.5–1.9)
Lactic Acid, Venous: 0.8 mmol/L (ref 0.5–1.9)

## 2024-03-29 LAB — LIPASE, BLOOD: Lipase: 34 U/L (ref 11–51)

## 2024-03-29 LAB — URINALYSIS, ROUTINE W REFLEX MICROSCOPIC
Bilirubin Urine: NEGATIVE
Glucose, UA: NEGATIVE mg/dL
Hgb urine dipstick: NEGATIVE
Ketones, ur: NEGATIVE mg/dL
Leukocytes,Ua: NEGATIVE
Nitrite: NEGATIVE
Protein, ur: NEGATIVE mg/dL
Specific Gravity, Urine: 1.018 (ref 1.005–1.030)
pH: 6 (ref 5.0–8.0)

## 2024-03-29 LAB — AMMONIA: Ammonia: 18 umol/L (ref 9–35)

## 2024-03-29 LAB — CBG MONITORING, ED: Glucose-Capillary: 100 mg/dL — ABNORMAL HIGH (ref 70–99)

## 2024-03-29 LAB — TROPONIN T, HIGH SENSITIVITY
Troponin T High Sensitivity: 17 ng/L (ref 0–19)
Troponin T High Sensitivity: 18 ng/L (ref 0–19)

## 2024-03-29 MED ORDER — LACTATED RINGERS IV BOLUS
1000.0000 mL | Freq: Once | INTRAVENOUS | Status: AC
  Administered 2024-03-29: 1000 mL via INTRAVENOUS

## 2024-03-29 MED ORDER — POLYETHYLENE GLYCOL 3350 17 GM/SCOOP PO POWD: 17.0000 g | Freq: Every day | ORAL | 0 refills | Status: AC

## 2024-03-29 MED ORDER — ACETAMINOPHEN 325 MG PO TABS
650.0000 mg | ORAL_TABLET | Freq: Once | ORAL | Status: AC
  Administered 2024-03-29: 650 mg via ORAL
  Filled 2024-03-29: qty 2

## 2024-03-29 MED ORDER — IOHEXOL 300 MG/ML  SOLN
100.0000 mL | Freq: Once | INTRAMUSCULAR | Status: AC | PRN
  Administered 2024-03-29: 100 mL via INTRAVENOUS

## 2024-03-29 MED ORDER — LIDOCAINE 5 % EX PTCH
1.0000 | MEDICATED_PATCH | Freq: Once | CUTANEOUS | Status: DC
  Administered 2024-03-29: 1 via TRANSDERMAL
  Filled 2024-03-29: qty 1

## 2024-03-29 NOTE — ED Triage Notes (Addendum)
 Pt BIB EMS from [facility]. Disoriented to everything, even person. . When trying to go to the bathroom she fell forward and struck her head. No obvious deformities, pt cites midline spine pain. No thinners. Pt also cited right hip pain. Hearing impaired on both sides. Hx of alzheimer's and dementia. Daughter at bedside is also concerned abt pt taper of Abilify  and it changing her demeanor.   EMS vitals  BP 146/78 HR 70 SPO2 99 RA CBG 120 98.9 temp

## 2024-03-29 NOTE — ED Notes (Signed)
 POA Duwaine (daughter) transported pt back to facility.

## 2024-03-29 NOTE — Telephone Encounter (Signed)
   FYI Only or Action Required?: Action required by provider: Daughter refusing ED for now until her sister goes to see patient in about an hour.Rollene Riggs office advocate notified at [facility]  Patient was last seen in primary care on 12/31/2023 by Merna Huxley, NP.  Called Nurse Triage reporting No chief complaint on file..  Symptoms began today.  Interventions attempted: Nothing.  Symptoms are: unchanged.  Triage Disposition: No disposition on file.  Patient/caregiver understands and will follow disposition?: Copied from CRM #[id number]. Topic: Clinical - Red Word Triage >> Mar 29, 2024 11:20 AM Alfonso ORN wrote: Red Word that prompted transfer to Nurse Triage: Daughter called on behalf of pt said Nurse at [facility] said unable to walk or feed herself independently with back pain , possibly evaluate for stroke symptoms. Difficulty walking. Can walk pretty normal Weaning abilify . Reason for Disposition  [1] SEVERE weakness (e.g., unable to walk or barely able to walk, requires support) AND [2] new-onset or getting worse  Answer Assessment - Initial Assessment Questions Patient is not on phone. Patient's daughter on phone and she is not with patient. Daughter Leita states the care facility that her mother is at called to say patient is having a lot of difficulty walking, holding her head up, complains of back pain. Daughter states patient's normal mental status  is confused. Patients daughter states she is being weaned off of abilify . 3. ONSET: When did these symptoms begin? (e.g., hours, days, weeks, months)     Today 4. CAUSE: What do you think is causing the weakness or fatigue? (e.g., not drinking enough fluids, medical problem, trouble sleeping)     unsure  Protocols used: Weakness (Generalized) and Fatigue-A-AH

## 2024-03-29 NOTE — ED Provider Notes (Signed)
  EMERGENCY DEPARTMENT AT [facility] Provider Note   CSN: [id number] Arrival date & time: 03/29/24  1452     Patient presents with: Emma Schneider is a 73 y.o. female.   This is a 73 year old female presenting to the emergency department from memory care unit at [facility].  Daughter at bedside provided HPI.  Patient oriented to self, but unreliable historian.  Daughter notes that patient has not been acting normal self since this morning.  Lying in bed, moaning.  Ambulated to the bathroom hunched over.  Daughter thinks it could be secondary to back pain as she does have history of the same.  While in the bathroom lost balance fell forward slowly somewhat caught herself with her right wrist, but did not hit her head.  No LOC, not on blood thinners.  Patient at baseline is confused and disoriented.  Can perform some basic ADLs and answer some simple yes/no questions.  However needs significant assistance.   Fall       Prior to Admission medications   Medication Sig Start Date End Date Taking? Authorizing Provider  polyethylene glycol powder (GLYCOLAX /MIRALAX ) 17 GM/SCOOP powder Take 17 g by mouth daily. Dissolve 1 capful (17g) in 4-8 ounces of liquid and take by mouth daily. 03/29/24  Yes Neysa Caron PARAS, DO  acetaminophen  (TYLENOL ) 325 MG tablet Take 2 tablets (650 mg total) by mouth every 8 (eight) hours as needed. 03/26/23   Nafziger, Darleene, NP  ALPRAZolam  (XANAX ) 0.25 MG tablet Take 1 tablet (0.25 mg total) by mouth at bedtime. 02/17/24   Nafziger, Darleene, NP  ARIPiprazole  (ABILIFY ) 2 MG tablet Take 0.5 tablets (1 mg total) by mouth daily for 7 days, THEN 0.5 tablets (1 mg total) every other day for 7 days, THEN 0.5 tablets (1 mg total) 3 (three) times a week for 7 days. 03/12/24 04/02/24  Nafziger, Darleene, NP  atorvastatin  (LIPITOR) 10 MG tablet Take 1 tablet (10 mg total) by mouth daily. 04/05/22   Nafziger, Cory, NP  calcium  carbonate (OS-CAL) 600 MG TABS  tablet Take 2 tablets (1,200 mg total) by mouth daily. 10/22/22   Nafziger, Darleene, NP  calcium  carbonate (TUMS) 500 MG chewable tablet Chew 2 tablets (400 mg of elemental calcium  total) by mouth every 6 (six) hours as needed for indigestion or heartburn. 03/26/23   Nafziger, Darleene, NP  Cholecalciferol  (VITAMIN D3) 25 MCG (1000 UT) CAPS Take 1 capsule (1,000 Units total) by mouth daily. 10/22/22   Nafziger, Darleene, NP  Dextromethorphan -guaiFENesin  5-100 MG/5ML LIQD Take 10 mLs by mouth every 8 (eight) hours as needed. 03/26/23   Nafziger, Darleene, NP  donepezil  (ARICEPT ) 10 MG tablet Take 1 tablet (10 mg total) by mouth at bedtime. 11/19/23   Wertman, Sara E, PA-C  doxycycline  (VIBRAMYCIN ) 100 MG capsule Take 1 capsule (100 mg total) by mouth 2 (two) times daily. 12/31/23   Nafziger, Darleene, NP  FEROSUL 325 (65 Fe) MG tablet Take 325 mg by mouth daily. 01/16/23   [provider]  ibuprofen  (ADVIL ) 400 MG tablet Take 1 tablet (400 mg total) by mouth every 8 (eight) hours as needed. 03/26/23   Nafziger, Darleene, NP  levothyroxine  (SYNTHROID ) 25 MCG tablet Take 1 tablet (25 mcg total) by mouth daily before breakfast. 04/05/22   Nafziger, Darleene, NP  loperamide  (IMODIUM  A-D) 2 MG tablet Take 1 tablet (2 mg total) by mouth every 12 (twelve) hours as needed for diarrhea or loose stools. 03/26/23   Nafziger, Darleene,  NP  memantine  (NAMENDA ) 10 MG tablet Take one tab two times a day 11/19/23   Wertman, Sara E, PA-C  mirtazapine  (REMERON ) 30 MG tablet Take 1 tablet (30 mg total) by mouth at bedtime. 12/04/22   Nafziger, Darleene, NP  predniSONE  (DELTASONE ) 10 MG tablet Take 1 tablet (10 mg total) by mouth daily with breakfast. 12/31/23   Nafziger, Darleene, NP  pseudoephedrine  (SUDAFED) 30 MG tablet Take 1 tablet (30 mg total) by mouth every 4 (four) hours as needed for congestion. 03/26/23   Nafziger, Darleene, NP    Allergies: Wellbutrin  [bupropion ]    Review of Systems  Updated Vital Signs BP 103/65   Pulse 64   Temp 98 F (36.7  C) (Oral)   Resp 15   Ht 5' 3 (1.6 m)   Wt 51.7 kg   SpO2 96%   BMI 20.19 kg/m   Physical Exam Vitals and nursing note reviewed.  Constitutional:      General: She is not in acute distress.    Appearance: She is not toxic-appearing.  HENT:     Head: Normocephalic.     Comments: Patient with hematoma to forehead with superficial abrasions.  Pupils equal round reactive to light.    Nose: Nose normal.     Mouth/Throat:     Mouth: Mucous membranes are moist.  Eyes:     Conjunctiva/sclera: Conjunctivae normal.  Neck:     Comments: C-collar in place Cardiovascular:     Rate and Rhythm: Normal rate and regular rhythm.  Pulmonary:     Effort: Pulmonary effort is normal.     Breath sounds: Normal breath sounds.  Abdominal:     General: Abdomen is flat. There is no distension.     Palpations: Abdomen is soft.     Tenderness: There is no abdominal tenderness. There is no guarding or rebound.  Musculoskeletal:        General: No tenderness or deformity.  Skin:    General: Skin is warm and dry.     Capillary Refill: Capillary refill takes less than 2 seconds.  Neurological:     Comments: Patient exam limited by per participation.  Somewhat compliant with exams was able to squeeze my fingers and wiggle toes.  Did state equal sensation in all extremities.  Psychiatric:     Comments: Sleepy     (all labs ordered are listed, but only abnormal results are displayed) Labs Reviewed  COMPREHENSIVE METABOLIC PANEL WITH GFR - Abnormal; Notable for the following components:      Result Value   Glucose, Bld 108 (*)    BUN 27 (*)    All other components within normal limits  CBG MONITORING, ED - Abnormal; Notable for the following components:   Glucose-Capillary 100 (*)    All other components within normal limits  CBC WITH DIFFERENTIAL/PLATELET  LIPASE, BLOOD  URINALYSIS, ROUTINE W REFLEX MICROSCOPIC  AMMONIA  I-STAT CG4 LACTIC ACID, ED  I-STAT CG4 LACTIC ACID, ED  TROPONIN T,  HIGH SENSITIVITY  TROPONIN T, HIGH SENSITIVITY    EKG: EKG Interpretation Date/Time:  Monday March 29 2024 16:34:19 EST Ventricular Rate:  70 PR Interval:    QRS Duration:  117 QT Interval:  447 QTC Calculation: 476 R Axis:   80  Text Interpretation: sinus rythm  Minimal ST elevation, anterior leads Artifact in lead(s) I II III aVR aVL aVF  Confirmed by Neysa Clap [phone]) on 03/29/2024 7:52:17 PM  Radiology: CT L-SPINE NO CHARGE Result Date: 03/29/2024 CLINICAL  DATA:  Fall going to the bathroom. EXAM: CT LUMBAR SPINE WITHOUT CONTRAST TECHNIQUE: Multidetector CT imaging of the lumbar spine was performed without intravenous contrast administration. Multiplanar CT image reconstructions were also generated. RADIATION DOSE REDUCTION: This exam was performed according to the departmental dose-optimization program which includes automated exposure control, adjustment of the mA and/or kV according to patient size and/or use of iterative reconstruction technique. COMPARISON:  None Available. FINDINGS: Segmentation: 5 lumbar type vertebrae. Alignment: Straightening of normal lordosis. Grade 1 retrolisthesis L4 on L5. No traumatic subluxation. Vertebrae: No acute fracture. Vertebral body heights are normal. The posterior elements are intact. Paraspinal and other soft tissues: No paraspinal hematoma. Please reference concurrently performed abdominopelvic CT for complete abdominal assessment. Disc levels: Multilevel degenerative disc disease, most prominently affecting L4-L5. Prominent multilevel facet hypertrophy. IMPRESSION: 1. No acute fracture or traumatic subluxation of the lumbar spine. 2. Multilevel degenerative disc disease and facet hypertrophy. Electronically Signed   By: Andrea Gasman M.D.   On: 03/29/2024 19:34   CT CHEST ABDOMEN PELVIS W CONTRAST Result Date: 03/29/2024 CLINICAL DATA:  Fall. EXAM: CT CHEST, ABDOMEN, AND PELVIS WITH CONTRAST TECHNIQUE: Multidetector CT imaging of the  chest, abdomen and pelvis was performed following the standard protocol during bolus administration of intravenous contrast. RADIATION DOSE REDUCTION: This exam was performed according to the departmental dose-optimization program which includes automated exposure control, adjustment of the mA and/or kV according to patient size and/or use of iterative reconstruction technique. CONTRAST:  OMNIPAQUE  IOHEXOL  300 MG/ML  SOLN COMPARISON:  Chest CT 12/06/2022.  CT abdomen and pelvis 04/01/2014. FINDINGS: CT CHEST FINDINGS Cardiovascular: Heart is mildly enlarged. Aorta is normal in size. There are atherosclerotic calcifications of the aorta. There is no pericardial effusion. Mediastinum/Nodes: No enlarged mediastinal, hilar, or axillary lymph nodes. Thyroid  gland, trachea, and esophagus demonstrate no significant findings. Lungs/Pleura: Mild emphysema is again seen. There are new bands of atelectasis in the lingula and right middle lobe. There is a stable linear band of atelectasis or scarring in the right upper lobe. There is no pleural effusion or pneumothorax. Musculoskeletal: No acute fractures are seen. CT ABDOMEN PELVIS FINDINGS Hepatobiliary: No focal liver abnormality is seen. No gallstones, gallbladder wall thickening, or biliary dilatation. Pancreas: Unremarkable. No pancreatic ductal dilatation or surrounding inflammatory changes. Spleen: Normal in size without focal abnormality. Adrenals/Urinary Tract: There is a stable right adrenal nodule which was previously characterized as adenoma. Left adrenal gland is within normal limits. There are bilateral renal cysts and hypodensities which are too small to characterize. The largest cyst is in the right kidney measuring 2 cm. The bladder is within normal limits. Stomach/Bowel: Stomach is within normal limits. Appendix appears normal. No evidence of bowel wall thickening, distention, or inflammatory changes. There is a large amount of stool in the colon and  rectum. There is sigmoid and descending colon diverticulosis. Vascular/Lymphatic: Aortic atherosclerosis. No enlarged abdominal or pelvic lymph nodes. Reproductive: Uterus and bilateral adnexa are unremarkable. Other: No abdominal wall hernia or abnormality. No abdominopelvic ascites. Musculoskeletal: No acute fractures are seen. There are degenerative changes of the lower lumbar spine. There is subcutaneous stranding lateral to the left hip which may be posttraumatic. IMPRESSION: 1. No acute posttraumatic sequelae in the chest, abdomen or pelvis. 2. Subcutaneous stranding lateral to the left hip may be posttraumatic. 3. Stable right adrenal adenoma. 4. Colonic diverticulosis. 5. Large amount of stool in the colon and rectum. Aortic Atherosclerosis (ICD10-I70.0) and Emphysema (ICD10-J43.9). Electronically Signed   By: Greig Pique  M.D.   On: 03/29/2024 19:34   CT Cervical Spine Wo Contrast Result Date: 03/29/2024 CLINICAL DATA:  Status post fall. EXAM: CT CERVICAL SPINE WITHOUT CONTRAST TECHNIQUE: Multidetector CT imaging of the cervical spine was performed without intravenous contrast. Multiplanar CT image reconstructions were also generated. RADIATION DOSE REDUCTION: This exam was performed according to the departmental dose-optimization program which includes automated exposure control, adjustment of the mA and/or kV according to patient size and/or use of iterative reconstruction technique. COMPARISON:  None Available. FINDINGS: Alignment: There is straightening of the normal cervical spine lordosis. Skull base and vertebrae: No acute fracture. No primary bone lesion or focal pathologic process. Soft tissues and spinal canal: No prevertebral fluid or swelling. No visible canal hematoma. Disc levels: Mild to moderate severity endplate sclerosis and posterior bony spurring are seen at the level of C5-C6, with marked severity endplate sclerosis, mild anterior osteophyte formation and marked severity posterior  bony spurring seen at the level of C6-C7. There is mild to moderate severity intervertebral disc space narrowing at C4-C5 and C5-C6 with moderate to marked severity intervertebral disc space narrowing at C6-C7. Bilateral moderate to marked severity multilevel facet joint hypertrophy is noted. This is most prominent the level of C6-C7. Upper chest: There is mild paraseptal and centrilobular emphysematous lung disease. Other: None. IMPRESSION: 1. No acute fracture or subluxation in the cervical spine. 2. Multilevel degenerative changes, most prominent at the level of C6-C7. 3. Mild paraseptal and centrilobular emphysematous lung disease. Electronically Signed   By: Suzen Dials M.D.   On: 03/29/2024 19:28   CT T-SPINE NO CHARGE Result Date: 03/29/2024 CLINICAL DATA:  Status post fall going to the bathroom. EXAM: CT THORACIC SPINE WITHOUT CONTRAST TECHNIQUE: Multidetector CT images of the thoracic were obtained using the standard protocol without intravenous contrast. RADIATION DOSE REDUCTION: This exam was performed according to the departmental dose-optimization program which includes automated exposure control, adjustment of the mA and/or kV according to patient size and/or use of iterative reconstruction technique. COMPARISON:  None Available. FINDINGS: Alignment: Normal. Vertebrae: No acute fracture. Normal vertebral body heights. The posterior elements are intact. No focal bone abnormality. Paraspinal and other soft tissues: No paraspinal hematoma. Please reference concurrently performed chest CT for thoracic assessment. Disc levels: Minor anterior spurring at multiple levels. No severe disc space narrowing. Calcifications in the posterior aspect of the spinal canal at T8-T9. This causes slight spinal canal narrowing. IMPRESSION: 1. No acute fracture or subluxation of the thoracic spine. 2. Calcifications in the posterior aspect of the spinal canal at T8-T9 cause slight spinal canal narrowing.  Electronically Signed   By: Andrea Gasman M.D.   On: 03/29/2024 19:28   CT Head Wo Contrast Result Date: 03/29/2024 CLINICAL DATA:  Status post fall. EXAM: CT HEAD WITHOUT CONTRAST TECHNIQUE: Contiguous axial images were obtained from the base of the skull through the vertex without intravenous contrast. RADIATION DOSE REDUCTION: This exam was performed according to the departmental dose-optimization program which includes automated exposure control, adjustment of the mA and/or kV according to patient size and/or use of iterative reconstruction technique. COMPARISON:  None Available. FINDINGS: Brain: There is mineralized cerebral atrophy with widening of the extra-axial spaces and ventricular dilatation. There are areas of decreased attenuation within the white matter tracts of the supratentorial brain, consistent with microvascular disease changes. Vascular: No hyperdense vessel or unexpected calcification. Skull: Normal. Negative for fracture or focal lesion. Sinuses/Orbits: Mild bilateral ethmoid sinus mucosal thickening is seen with a subcentimeter left ethmoid sinus osteoma  also noted. Other: There is mild to moderate severity right frontal scalp soft tissue swelling. IMPRESSION: 1. Generalized cerebral atrophy with chronic white matter small vessel ischemic changes. 2. Mild to moderate severity right frontal scalp soft tissue swelling without evidence of an acute fracture or acute intracranial abnormality. 3. Mild bilateral ethmoid sinus disease. Electronically Signed   By: Suzen Dials M.D.   On: 03/29/2024 19:24   DG Wrist Complete Right Result Date: 03/29/2024 EXAM: 3 OR MORE VIEW(S) XRAY OF THE WRIST 03/29/2024 04:58:00 PM COMPARISON: None available. CLINICAL HISTORY: fall FINDINGS: BONES AND JOINTS: No acute fracture. No focal osseous lesion. No joint dislocation. Moderate degenerative changes of the first [facility] joint with joint space narrowing and marginal osteophytosis. SOFT TISSUES: The  soft tissues are unremarkable. IMPRESSION: 1. No acute fracture or dislocation. 2. 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 Electronically signed by: Rockey Kilts MD 03/29/2024 05:44 PM EST RP Workstation: [id number]     Procedures   Medications Ordered in the ED  lidocaine (LIDODERM) 5 % 1 patch (1 patch Transdermal Patch Applied 03/29/24 1615)  lactated ringers bolus 1,000 mL (0 mLs Intravenous Stopped 03/29/24 2057)  acetaminophen  (TYLENOL ) tablet 650 mg (650 mg Oral Given 03/29/24 1607)  iohexol (OMNIPAQUE) 300 MG/ML solution 100 mL (100 mLs Intravenous Contrast Given 03/29/24 1802)    Clinical Course as of 03/29/24 2242  Mon Mar 29, 2024  1950 Workup is reassuring.  No fever tachycardia leukocytosis, no elevated lactate to suggest infectious process.  No anemia to explain symptoms.  She had no metabolic derangements.  Normal kidney function.  No transaminitis to suggest acute hepatobiliary disease or hepatic cephalopathy.  Troponin is normal.  EKG without ischemic changes.  Appears to be sinus rhythm.  UA without evidence of infection.  Lipase normal.  Pancreatitis unlikely.  CT head without traumatic injury.  C-spine without traumatic injury.  CT chest abdomen pelvis with constipation.  Noted some stranding to the right hip, but has no skin changes on physical exam to suggest cellulitis or infectious process.  Was given fluids here in the emergency department.  Daughter feels comfortable with patient going back to facility.  Will discharge in stable condition. [TY]    Clinical Course User Index [TY] Neysa Caron PARAS, DO                                 Medical Decision Making This is a 73 year old female history of Alzheimer's dementia from assisted living facility after fall.  She is afebrile nontachycardic, slightly hypertensive.  Maintaining oxygen saturation on room air.  Do not appreciate gross deficits to suggest acute stroke.  Daughter provided history and noted that patient's behavior  has been off since this morning prior to the fall.  She was also present and states it was a slow fall to the ground with no LOC.  I do not see blood thinners on medication list.  Daughter also denies.  She notes that they have been tapering off of her Abilify  and wonders if that could be cause of her symptoms.  I do not appreciate obvious traumatic injury from fall other than small hematoma to her forehead.  Given patient's reported behavioral disturbance from baseline and fall we will get broad workup and imaging to further evaluate.  See ED course for further MDM and final disposition.  Amount and/or Complexity of Data Reviewed Labs: ordered. Radiology: ordered. ECG/medicine tests: ordered.  Risk OTC drugs.  Prescription drug management.       Final diagnoses:  Fall, initial encounter  Concussion without loss of consciousness, initial encounter  Constipation, unspecified constipation type    ED Discharge Orders          Ordered    polyethylene glycol powder (GLYCOLAX/MIRALAX) 17 GM/SCOOP powder  Daily        03/29/24 1954               Neysa Caron PARAS, DO 03/29/24 2242

## 2024-03-29 NOTE — Discharge Instructions (Signed)
 Please follow-up with your primary doctor.  Return meetly for fevers, chills, facial droop, unilateral weakness, lethargy, chest pain, difficulty breathing, uncontrolled nausea vomiting or any new or worsening symptoms that are concerning to you.  We are prescribing you MiraLAX that you can take for constipation.  Please discuss further medications with your primary doctor.

## 2024-03-30 ENCOUNTER — Telehealth: Payer: Self-pay

## 2024-03-30 NOTE — Telephone Encounter (Signed)
 Called pt POA back no answer. Advised for her to call back tomorrow.

## 2024-03-30 NOTE — Telephone Encounter (Signed)
 Please advise

## 2024-03-30 NOTE — Telephone Encounter (Signed)
 Copied from CRM #[id number]. Topic: Clinical - Medical Advice >> Mar 29, 2024  1:36 PM Emma Schneider wrote: Reason for CRM: Patient is on a taper regime for Abilify , dx of dementia (1 time, every other day)   Patient has been agitated for last 24 hours Patient has not had medication since Friday and is now refusing all medications.   Patient's POA, Emma Schneider is requesting Ativan, 1 time STAT dose, gel form, to take edge off agitation, or something similar that she does not have to swallow, they essentially need pt to calm down to take dose of medication. Would like rx sent to local pharmacy.  Also cannot get sample, suspect UTI Vitals stable. C/b: [phone]

## 2024-03-30 NOTE — Telephone Encounter (Signed)
 Please see other phone note for this.

## 2024-03-31 ENCOUNTER — Other Ambulatory Visit: Payer: Self-pay | Admitting: Adult Health

## 2024-03-31 MED ORDER — CLONAZEPAM 0.25 MG PO TBDP: ORAL_TABLET | ORAL | 0 refills | Status: AC

## 2024-03-31 NOTE — Telephone Encounter (Unsigned)
 Copied from CRM #[id number]. Topic: Clinical - Medical Advice >> Mar 31, 2024  9:32 AM Emylou G wrote: Adv POA appt confirmed - she doesn't need urgent visit at this time.  ( She said due to our lack of  response she ended up going to ER - she wanted to give us  that feedback )

## 2024-03-31 NOTE — Progress Notes (Signed)
Klonopin 

## 2024-04-06 ENCOUNTER — Ambulatory Visit: Admitting: Adult Health

## 2024-04-06 ENCOUNTER — Ambulatory Visit: Payer: Self-pay | Admitting: Adult Health

## 2024-04-06 ENCOUNTER — Encounter: Payer: Self-pay | Admitting: Adult Health

## 2024-04-06 VITALS — BP 110/60 | HR 69 | Temp 98.0°F | Ht 63.0 in | Wt 115.0 lb

## 2024-04-06 DIAGNOSIS — M8580 Other specified disorders of bone density and structure, unspecified site: Secondary | ICD-10-CM

## 2024-04-06 DIAGNOSIS — E039 Hypothyroidism, unspecified: Secondary | ICD-10-CM

## 2024-04-06 DIAGNOSIS — F0280 Dementia in other diseases classified elsewhere without behavioral disturbance: Secondary | ICD-10-CM

## 2024-04-06 DIAGNOSIS — C9201 Acute myeloblastic leukemia, in remission: Secondary | ICD-10-CM

## 2024-04-06 DIAGNOSIS — D3501 Benign neoplasm of right adrenal gland: Secondary | ICD-10-CM

## 2024-04-06 DIAGNOSIS — E782 Mixed hyperlipidemia: Secondary | ICD-10-CM

## 2024-04-06 DIAGNOSIS — Z23 Encounter for immunization: Secondary | ICD-10-CM

## 2024-04-06 LAB — LIPID PANEL
Cholesterol: 147 mg/dL (ref 0–200)
HDL: 54.2 mg/dL (ref >39.00)
LDL Cholesterol: 55 mg/dL (ref 0–99)
NonHDL: 93.03
Total CHOL/HDL Ratio: 3
Triglycerides: 189 mg/dL — ABNORMAL HIGH (ref 0.0–149.0)
VLDL: 37.8 mg/dL (ref 0.0–40.0)

## 2024-04-06 LAB — TSH: TSH: 4.07 u[IU]/mL (ref 0.35–5.50)

## 2024-04-06 MED ORDER — ARIPIPRAZOLE 2 MG PO TABS: 1.0000 mg | ORAL_TABLET | Freq: Every day | ORAL | 0 refills | Status: AC

## 2024-04-06 NOTE — Patient Instructions (Signed)
 It was great seeing you today   We will follow up with you regarding your lab work   Please let me know if you need anything   I am going to place you back on Abilify  but at 1 mg dose.   If you need you can take Xanax  PRN, only use the Klonopin  if she is not willing to take oral medication.

## 2024-04-06 NOTE — Progress Notes (Signed)
 Subjective:    Patient ID: Emma Schneider, female    DOB: 14-May-1950, 73 y.o.   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  She presents to the office today with her daughter for six month follow up regarding chronic medical conditions   Alzheimer's dementia- Managed by neurology.   MRI of the brain in January 2023 was remarkable for generalized atrophy, negative for hydrocephalus, and with mild to moderate microvascular ischemic changes.  Neuropsychiatry evaluation with Dr. Terrell is concerning for dementia due to Alzheimer's disease.  She is currently managed with Aricept  10 mg daily and Namenda  10 mg BID.  She has had some worsening cognitive decline and needs more assistance with her ADLs.  She is having difficulty remembering recent conversations and names of people she just met, this leads her to avoiding conversations due to awareness.  Continues to live at [facility] and has moved into the memory care unit.   She was started on Abilify  5 mg back in December 2024 for agitation related to dementia.  In March 2025 her dose was decreased to 2 mg as patient was very sedated and reluctant to eat and did well on this dose.  Less than a month ago, the patient's daughter reported that her mom's affect has become more flat and that the nursing manager at [facility] and her thought it would be time to stop Abilify  and she had transitioned to the memory care area.  We did wean her off Abilify  her daughter  reports that since she has been off Abilify , multiple staff have noticed less cooperation by the patient  She does have an as needed Xanax  0.25 mg presription for anxiety and behavioral disturbances but does not have to use this often.   Hyperlipidemia-managed with Lipitor 10 mg daily.  She denies fatigue, myalgia, or chest pain. Lab Results  Component Value Date   CHOL 181 04/08/2023   HDL 61.20 04/08/2023   LDLCALC 87 04/08/2023   TRIG 161.0 (H) 04/08/2023   CHOLHDL 3 04/08/2023   Osteopenia-currently managed with Fosamax 70 mg weekly my .  This is managed by GYN.  Her last bone density was in July 2022  Acute myeloid leukemia-diagnosed in 2000 with bone marrow biopsy.  In the past she has been managed by [facility] hematology/oncology on a yearly basis.  At this time she was released and was advised that her PCP managed with yearly CBCs as a risk for early relapse at this time was incredibly low.  She denies fevers, chills, anorexia, weight loss, nausea, vomiting, or diarrhea  Right adrenal adenoma- no longer being followed.   Hypothyroidism-managed with Synthroid  25 mcg daily Lab Results  Component Value Date   TSH 1.60 04/08/2023     Review of Systems  Unable to perform ROS: Dementia   Past Medical History:  Diagnosis Date   Adenoma of right adrenal gland 12/16/2014   Adrenal incidentaloma    AML (acute myeloid leukemia) in remission 09/28/1998   completed tx 01/1999 - remission since, sees heme-onc at [facility] annually. Dr. 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 Will  repeat dexa this year and Rx risidronate in the mean time. Asked pt to find out from PCP if they had prescribed Actonel prior so we can determine how many year   Personal history of colonic polyps 09/27/2016   3 polyps removed, repeated 02/2020, due 3 yrs   Thyroid  dysfunction 05/11/2019   dx 02/2019, started on Euthyrox    Tobacco use 01/06/2015   Vitamin D  deficiency     Social History   Socioeconomic History   Marital status: Married    Spouse name: Not on file   Number of children: Not on file   Years of education: 13   Highest education level: Some college, no degree  Occupational History   Occupation: Retired    Comment: Sales executive  Tobacco Use   Smoking status: Former    Current packs/day: 0.00    Types: Cigarettes    Quit date: 03/04/2023    Years since quitting: 1.0   Smokeless tobacco: Never   Tobacco comments:    Is actively trying to cut down number of cigarettes, but not ready to set a quit date. 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    -Married for 30 years    - Has step children, all live in [place]   - No pets   - Plays games on the computer and enjoys yard work.    Two story home   Caffeine a lot of pepsi   Social Drivers of Health   Financial Resource Strain: Low Risk  (08/04/2023)   Overall Financial Resource Strain (CARDIA)    Difficulty of Paying Living Expenses: Not hard at all  Food Insecurity: No Food Insecurity (08/04/2023)   Hunger Vital Sign    Worried About Running Out of Food in the Last Year: Never true    Ran Out of Food in the Last Year: Never true  Transportation Needs: No Transportation Needs (08/04/2023)   PRAPARE - Administrator, Civil Service (Medical):  No    Lack of Transportation (Non-Medical): No  Physical Activity: Inactive (08/04/2023)   Exercise Vital Sign    Days of Exercise per Week: 0 days    Minutes of Exercise per Session: 0 min  Stress: No Stress Concern Present (08/04/2023)   [employer] of Occupational Health - Occupational Stress Questionnaire    Feeling of Stress : Not at all  Social Connections: Moderately Integrated (08/04/2023)   Social Connection and Isolation Panel    Frequency of Communication with Friends and Family: More than three times a week    Frequency of Social Gatherings with Friends and Family: More than three times a week    Attends Religious Services: More than 4 times per year  Active Member of Clubs or Organizations: Yes    Attends Banker Meetings: More than 4 times per year    Marital Status: Widowed  Intimate Partner Violence: Not At Risk (08/04/2023)   Humiliation, Afraid, Rape, and Kick questionnaire    Fear of Current or Ex-Partner: No    Emotionally Abused: No    Physically Abused: No    Sexually Abused: No    History reviewed. No pertinent surgical history.  Family History  Problem Relation Age of Onset   Congestive Heart Failure Mother    Dementia Mother        late in life   Heart attack Father    Heart disease Father     Allergies  Allergen Reactions   Wellbutrin  [Bupropion ] Other (See Comments)    Hallucinations     Current Outpatient Medications on File Prior to Visit  Medication Sig Dispense Refill   acetaminophen  (TYLENOL ) 325 MG tablet Take 2 tablets (650 mg total) by mouth every 8 (eight) hours as needed. 30 tablet 0   ALPRAZolam  (XANAX ) 0.25 MG tablet Take 1 tablet (0.25 mg total) by mouth at bedtime. (Patient taking differently: Take 0.25 mg by mouth at bedtime as needed.) 90 tablet 1   atorvastatin  (LIPITOR) 10 MG tablet Take 1 tablet (10 mg total) by mouth daily. 90 tablet 3   calcium  carbonate (OS-CAL) 600 MG TABS tablet Take 2 tablets (1,200 mg  total) by mouth daily. 90 tablet 3   calcium  carbonate (TUMS) 500 MG chewable tablet Chew 2 tablets (400 mg of elemental calcium  total) by mouth every 6 (six) hours as needed for indigestion or heartburn. 60 tablet 0   Cholecalciferol  (VITAMIN D3) 25 MCG (1000 UT) CAPS Take 1 capsule (1,000 Units total) by mouth daily. 90 capsule 3   clonazePAM  (KLONOPIN ) 0.25 MG disintegrating tablet One tablet daily PRN for agitation. 5 tablet 0   Dextromethorphan -guaiFENesin  5-100 MG/5ML LIQD Take 10 mLs by mouth every 8 (eight) hours as needed. 10 mL 0   donepezil  (ARICEPT ) 10 MG tablet Take 1 tablet (10 mg total) by mouth at bedtime. 90 tablet 4   FEROSUL 325 (65 Fe) MG tablet Take 325 mg by mouth daily.     ibuprofen  (ADVIL ) 400 MG tablet Take 1 tablet (400 mg total) by mouth every 8 (eight) hours as needed. 30 tablet 0   levothyroxine  (SYNTHROID ) 25 MCG tablet Take 1 tablet (25 mcg total) by mouth daily before breakfast. 90 tablet 3   loperamide  (IMODIUM  A-D) 2 MG tablet Take 1 tablet (2 mg total) by mouth every 12 (twelve) hours as needed for diarrhea or loose stools. 30 tablet 0   memantine  (NAMENDA ) 10 MG tablet Take one tab two times a day 60 tablet 11   mirtazapine  (REMERON ) 30 MG tablet Take 1 tablet (30 mg total) by mouth at bedtime. 90 tablet 1   polyethylene glycol powder (GLYCOLAX /MIRALAX ) 17 GM/SCOOP powder Take 17 g by mouth daily. Dissolve 1 capful (17g) in 4-8 ounces of liquid and take by mouth daily. 238 g 0   pseudoephedrine  (SUDAFED) 30 MG tablet Take 1 tablet (30 mg total) by mouth every 4 (four) hours as needed for congestion. 30 tablet 0   No current facility-administered medications on file prior to visit.    BP 110/60   Pulse 69   Temp 98 F (36.7 C) (Oral)   Ht 5' 3 (1.6 m)   Wt 115 lb (52.2 kg)   SpO2 98%   BMI  20.37 kg/m       Objective:   Physical Exam Vitals and nursing note reviewed.  Constitutional:      Appearance: Normal appearance.  Cardiovascular:     Rate  and Rhythm: Normal rate and regular rhythm.     Pulses: Normal pulses.     Heart sounds: Normal heart sounds.  Pulmonary:     Effort: Pulmonary effort is normal.     Breath sounds: Normal breath sounds.  Skin:    General: Skin is warm and dry.  Neurological:     General: No focal deficit present.     Mental Status: She is alert. Mental status is at baseline.  Psychiatric:        Mood and Affect: Mood normal.        Behavior: Behavior normal.        Thought Content: Thought content normal.        Cognition and Memory: Cognition is impaired. Memory is impaired.        Judgment: Judgment normal.        Assessment & Plan:  1. Dementia likely due to Alzheimer's disease (Primary) - Will place back on Abilify  at 1 mg dose and increase if needed - Follow up with Neurology as directed - ARIPiprazole  (ABILIFY ) 2 MG tablet; Take 0.5 tablets (1 mg total) by mouth daily.  Dispense: 15 tablet; Refill: 0  2. Mixed hyperlipidemia - Continue with Lipitor 10 mg daily  - Lipid panel; Future - TSH; Future - TSH - Lipid panel  3. Osteopenia, unspecified location - Continue with Fosamax - Follow up with Endocrinology as directed  4. AML (acute myeloid leukemia) in remission - Recent CBC WNL  5. Adenoma of right adrenal gland - No longer monitoring   6. Hypothyroidism, unspecified type - Consider dose change of synthroid   - Lipid panel; Future - TSH; Future - TSH - Lipid panel  7. Need for influenza vaccination  - Flu vaccine HIGH DOSE PF(Fluzone Trivalent)  Press Casale, NP  I personally spent a total of 52 minutes in the care of the patient today including preparing to see the patient, getting/reviewing separately obtained history, performing a medically appropriate exam/evaluation, counseling and educating, placing orders, and documenting clinical information in the EHR.

## 2024-05-24 NOTE — Progress Notes (Incomplete)
 "   Dementia likely due to Alzheimer disease   Emma Schneider is a delightful 74 y.o. RH female with a history of hypertension, hyperlipidemia, hypothyroidism, remote leukemia status post chemo 20 years ago, with a diagnosis of dementia likely due to Alzheimer's disease seen today in follow up for memory loss. Patient is currently on donepezil  10 mg daily and memantine  10 mg twice daily***.   Patient was last seen on 11/21/2023***. Memory decline is noted, needing more assistance with ADLs than prior.  She lives in [place] memory care for social and cognitive stimulation as well as for 24/7 safety. Mood and sleep are controlled by PCP.*** . This patient is accompanied in the office by her 2 daughters*** who supplements the history.  Previous records as well as any outside records available were reviewed prior to todays visit.   Follow up in  months Continue donepezil  10 mg daily and memantine  10 mg twice daily, side effects discussed.***Ask about the role of these medicines*** Recommend good control of cardiovascular risk factors.   Continue to control mood as per PCP, she is on Abilify  1 mg daily (increase sleepiness with greater dose) Continue 24/7 monitoring at [facility] memory care.   Discussed the use of AI scribe software for clinical note transcription with the patient, who gave verbal consent to proceed.  History of Present Illness     Any changes in memory since last visit? Not having a good day today, she is very sleepy, has the nights and days reversed. Memory is worse.  Has moved to memory care now.  She has difficulty with both STM and LTM.    Since moving to memory care she is quieter, She is not actively participating in the activities, but is able to join them . She wants to Be with the group. She may be able to play solitaire when her daughter visits.   repeats oneself?  Endorsed Disoriented when walking into a room? Denies    Leaving objects?  May misplace things  such as the glasses The station keeps the hearing aids.    Wandering behavior?  Not now, but while at  Assisted living she would follow someone out.  Not now at memory care Any personality changes since last visit? .  She is less cooperative than before, less interactive*** Any worsening depression?:  She has a history of depression after the death of her husband Hallucinations or paranoia?  Denies.   Seizures? denies    Any sleep changes?  She walks and paces all night. Does not sleep at night and then she has daytime sleepiness.   Sleep apnea?   Denies.   Any hygiene concerns?  Denies  Independent of bathing and dressing?  She needs assistance. Does the patient needs help with medications?   Facility is in charge   Who is in charge of the finances?  Daughter and daughter is in charge     Any changes in appetite?  denies     Patient have trouble swallowing? Denies.    Any headaches?   denies   Any vision changes?  Chronic R hip pain, worse today after yesterday when she walked a little more than before   Ambulates with difficulty? Denies.   Recent falls or head injuries?  She sustained a mechanical fall in the bathroom***no LOC, she hit the right forehead neck and negative workup, films were negative for acute findings unilateral weakness, numbness or tingling? denies   Any tremors?  Occasionally  Any anosmia?  Denies   Any incontinence of urine?  Endorsed, wears Depends    Any bowel dysfunction?  Denies      Patient lives at [facility]  memory care since the last month  Does the patient drive? No longer drives      Prior MRI brain January 2023 personally reviewed was remarkable for generalized atrophy, and mild to moderate microvascular ischemic changes.     Initial evaluation on 11/15/2021   How long did patient have memory difficulties?  Her family report that the memory issues have been present for about 5 years worse since about Oct 2022. She also has greater difficulty  with reading comprehension.  She has more difficulty with crossword puzzles and word finding.  Long-term memory is normal. There are difficulties with multitasking, organization and impulsivity. Patient lives with: Spouse  repeats oneself? Endorsed occasionally, although her husband says that this is more frequent than prior. Disoriented when walking into a room?  It can be a scary situation if she is in a new environment.   Leaving objects in unusual places?  Patient denies   Ambulates  with difficulty?  Getting a little more wobbly than before . She has to think ahead if she is in uneven ground.   Recent falls?  Patient denies   Any head injuries?  Patient denies   History of seizures?   Patient denies   Wandering behavior?  Patient denies   Patient drives?  Her husband does not allow her to drive alone, and she only drives very short distances, preventing major intersections. Any mood changes?  Family report more impulsivity, she becomes irritable more quickly, especially when confronted.  She was taking Wellbutrin  which was discontinued due to hallucinations. Any history of depression?:  Denies Hallucinations? Around 03/2021 she began having visual hallucinations, such as seeing dogs and cats under her porch, and animals roaming through the house, but after discontinuing Wellbutrin  these hallucinations disappeared.  Paranoia? About 1 year ago it was reported the patient began accusing her husband of having affairs.  This has subsided. Patient reports that she sleeps well without vivid dreams, REM behavior or sleepwalking    History of sleep apnea?  Patient denies   Any hygiene concerns?  Patient denies   Independent of bathing and dressing?  Endorsed  Does the patient needs help with medications?  Husband is in charge Who is in charge of the finances?  Husband is in charge    Any changes in appetite?  Patient denies   Patient have trouble swallowing? Patient denies   Does the patient  cook?  Patient denies .  Has forgotten common recipes for the last year, has become more confused between the tsp and Tsp  Any kitchen accidents such as leaving the stove on? Patient denies   Any headaches?  Only when stressed. Double vision? Patient denies   Any focal numbness or tingling?  Patient denies   Chronic back pain Patient denies   Unilateral weakness?  Patient denies   Any tremors?  Patient denies   Any history of anosmia?  Patient denies   Any incontinence of urine?  Patient denies   Any bowel dysfunction?   Patient denies      History of heavy alcohol intake?  Patient denies   History of heavy tobacco use?  Endorsed Family history of dementia?  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 She continues to smoke   Neuropsych evaluation 10/2021 Dr. Richie  Briefly, results suggested  primary impairments surrounding semantic fluency, confrontation naming, and essentially all aspects of learning and memory. Additional weaknesses were exhibited across complex attention, receptive language, and visuospatial abilities, while variability was exhibited across processing speed, basic attention, and executive functioning. Regarding etiology, I unfortunately have concerns surrounding underlying Alzheimer's disease. Ms. Bohne did not benefit from repeated exposure to information across learning trials, performed poorly across delayed recall trials, and performed poorly across yes/no recognition trials. Taken together, this suggests evidence for rapid forgetting and an evolving and already quite significant memory storage impairment, both of which are hallmark characteristics of this illness. Additional impairments across confrontation naming and semantic fluency align very well with this illness as they are the expected domains to exhibit impairment following early memory dysfunction. Visuospatial weakness also aligns well with typical disease progression. While I am certainly not able to  ascertain the validity of her allegations surrounding her husband's behaviors, both her husband and their daughter denied these actions and the emergence of suspicion and delusional thinking is certainly seen in individuals with Alzheimer's disease.             11/22/2022   12:00 PM 05/23/2022    1:00 PM  MMSE - Mini Mental State Exam  Orientation to time 1 4  Orientation to Place 5 5  Registration 2 2  Attention/ Calculation 0 0  Recall 0 2  Language- name 2 objects 2 2  Language- repeat 0 1  Language- follow 3 step command 3 3  Language- read & follow direction 1 1  Write a sentence 1 1  Copy design 0 0  Total score 15 21       No data to display            Objective:    Neurological Exam:    VITALS:  There were no vitals filed for this visit.  GEN:  The patient appears stated age and is in NAD. HEENT:  Normocephalic, atraumatic.   Neurological examination:  General: NAD, well-groomed, appears stated age. Orientation: The patient is alert. Oriented to person, not to place or date.  Cranial nerves: There is good facial symmetry.The speech is fluent and clear. No aphasia or dysarthria. Fund of knowledge is reduced. Recent and remote memory are impaired. Attention and concentration are reduced. Able to name objects and repeat phrases.  Hearing is decreased to conversational tone. *** Sensation: Sensation is intact to light touch throughout Motor: Strength is at least antigravity x4. DTR's 2/4 in UE/LE     Movement examination:  Tone: There is normal tone in the UE/LE Abnormal movements:  no tremor today.  No myoclonus.  No asterixis.   Coordination:  There is L>R decremation with RAM's.  Abnormal FTN, favors right hand.  Gait and Station: The patient has no*** difficulty arising out of a deep-seated chair without the use of the hands. The patient's stride length is short.  Gait is cautious and narrow.    Thank you for allowing us  the opportunity to participate in  the care of this nice patient. Please do not hesitate to contact us  for any questions or concerns.   Total time spent on today's visit was *** minutes dedicated to this patient today, preparing to see patient, examining the patient, ordering tests and/or medications and counseling the patient, documenting clinical information in the EHR or other health record, independently interpreting results and communicating results to the patient/family, discussing treatment and goals, answering patient's questions and coordinating care.  Cc:  Merna Huxley,  NP  Camie Sevin 05/24/2024 10:50 AM      "

## 2024-05-25 ENCOUNTER — Ambulatory Visit: Admitting: Physician Assistant

## 2024-07-20 ENCOUNTER — Ambulatory Visit: Admitting: Physician Assistant

## 2024-08-16 ENCOUNTER — Ambulatory Visit
